# Patient Record
Sex: Male | Born: 1960 | Race: White | Hispanic: No | Smoking: Never smoker
Health system: Southern US, Community
[De-identification: ages and names within clinical notes are randomized; demographics above are authoritative.]

## PROBLEM LIST (undated history)

## (undated) ENCOUNTER — Emergency Department (HOSPITAL_COMMUNITY): Payer: 59

## (undated) DIAGNOSIS — F329 Major depressive disorder, single episode, unspecified: Secondary | ICD-10-CM

## (undated) DIAGNOSIS — R3915 Urgency of urination: Secondary | ICD-10-CM

## (undated) DIAGNOSIS — G8929 Other chronic pain: Secondary | ICD-10-CM

## (undated) DIAGNOSIS — K5900 Constipation, unspecified: Secondary | ICD-10-CM

## (undated) DIAGNOSIS — M75102 Unspecified rotator cuff tear or rupture of left shoulder, not specified as traumatic: Secondary | ICD-10-CM

## (undated) DIAGNOSIS — M1990 Unspecified osteoarthritis, unspecified site: Secondary | ICD-10-CM

## (undated) DIAGNOSIS — R51 Headache: Secondary | ICD-10-CM

## (undated) DIAGNOSIS — S83241A Other tear of medial meniscus, current injury, right knee, initial encounter: Secondary | ICD-10-CM

## (undated) DIAGNOSIS — R42 Dizziness and giddiness: Secondary | ICD-10-CM

## (undated) DIAGNOSIS — E785 Hyperlipidemia, unspecified: Secondary | ICD-10-CM

## (undated) DIAGNOSIS — F32A Depression, unspecified: Secondary | ICD-10-CM

## (undated) DIAGNOSIS — J302 Other seasonal allergic rhinitis: Secondary | ICD-10-CM

## (undated) DIAGNOSIS — J189 Pneumonia, unspecified organism: Secondary | ICD-10-CM

## (undated) DIAGNOSIS — E119 Type 2 diabetes mellitus without complications: Secondary | ICD-10-CM

## (undated) DIAGNOSIS — G4700 Insomnia, unspecified: Secondary | ICD-10-CM

## (undated) DIAGNOSIS — M7542 Impingement syndrome of left shoulder: Secondary | ICD-10-CM

## (undated) DIAGNOSIS — M2550 Pain in unspecified joint: Secondary | ICD-10-CM

## (undated) DIAGNOSIS — R350 Frequency of micturition: Secondary | ICD-10-CM

## (undated) DIAGNOSIS — I10 Essential (primary) hypertension: Secondary | ICD-10-CM

## (undated) DIAGNOSIS — K219 Gastro-esophageal reflux disease without esophagitis: Secondary | ICD-10-CM

## (undated) DIAGNOSIS — M549 Dorsalgia, unspecified: Secondary | ICD-10-CM

## (undated) HISTORY — PX: LITHOTRIPSY: SUR834

## (undated) HISTORY — PX: NASAL SEPTUM SURGERY: SHX37

## (undated) HISTORY — DX: Impingement syndrome of left shoulder: M75.42

## (undated) HISTORY — DX: Other tear of medial meniscus, current injury, right knee, initial encounter: S83.241A

## (undated) HISTORY — PX: TONSILLECTOMY: SUR1361

## (undated) HISTORY — PX: COLONOSCOPY: SHX174

## (undated) HISTORY — PX: OTHER SURGICAL HISTORY: SHX169

## (undated) HISTORY — PX: BACK SURGERY: SHX140

## (undated) HISTORY — DX: Unspecified rotator cuff tear or rupture of left shoulder, not specified as traumatic: M75.102

---

## 1979-12-14 HISTORY — PX: OTHER SURGICAL HISTORY: SHX169

## 1984-12-13 DIAGNOSIS — J189 Pneumonia, unspecified organism: Secondary | ICD-10-CM

## 1984-12-13 HISTORY — DX: Pneumonia, unspecified organism: J18.9

## 2001-01-12 ENCOUNTER — Encounter: Payer: Self-pay | Admitting: Otolaryngology

## 2001-01-12 ENCOUNTER — Encounter: Admission: RE | Admit: 2001-01-12 | Discharge: 2001-01-12 | Payer: Self-pay | Admitting: Otolaryngology

## 2001-01-24 ENCOUNTER — Encounter: Payer: Self-pay | Admitting: Otolaryngology

## 2001-01-24 ENCOUNTER — Encounter: Admission: RE | Admit: 2001-01-24 | Discharge: 2001-01-24 | Payer: Self-pay | Admitting: Otolaryngology

## 2001-02-07 ENCOUNTER — Ambulatory Visit (HOSPITAL_BASED_OUTPATIENT_CLINIC_OR_DEPARTMENT_OTHER): Admission: RE | Admit: 2001-02-07 | Discharge: 2001-02-07 | Payer: Self-pay | Admitting: Otolaryngology

## 2009-02-05 ENCOUNTER — Ambulatory Visit: Payer: Self-pay | Admitting: Urology

## 2010-03-12 ENCOUNTER — Ambulatory Visit: Payer: Self-pay | Admitting: Urology

## 2010-03-16 ENCOUNTER — Ambulatory Visit: Payer: Self-pay | Admitting: Urology

## 2010-12-28 ENCOUNTER — Ambulatory Visit: Payer: Self-pay | Admitting: Gastroenterology

## 2010-12-30 LAB — PATHOLOGY REPORT

## 2011-06-04 ENCOUNTER — Ambulatory Visit: Payer: Self-pay | Admitting: Urology

## 2011-06-23 ENCOUNTER — Ambulatory Visit: Payer: Self-pay | Admitting: Urology

## 2011-06-30 ENCOUNTER — Ambulatory Visit: Payer: Self-pay | Admitting: Urology

## 2011-11-30 ENCOUNTER — Ambulatory Visit: Payer: Self-pay | Admitting: Urology

## 2011-12-16 ENCOUNTER — Ambulatory Visit: Payer: Self-pay | Admitting: Urology

## 2012-11-25 ENCOUNTER — Encounter: Payer: Self-pay | Admitting: *Deleted

## 2012-11-30 ENCOUNTER — Other Ambulatory Visit: Payer: Self-pay | Admitting: Neurosurgery

## 2012-12-22 ENCOUNTER — Encounter (HOSPITAL_COMMUNITY): Payer: Self-pay

## 2012-12-22 ENCOUNTER — Encounter (HOSPITAL_COMMUNITY)
Admission: RE | Admit: 2012-12-22 | Discharge: 2012-12-22 | Disposition: A | Discharge status: Home / Self Care | Payer: 59 | Source: Ambulatory Visit | Attending: Neurosurgery | Admitting: Neurosurgery

## 2012-12-22 ENCOUNTER — Encounter (HOSPITAL_COMMUNITY)
Admission: RE | Admit: 2012-12-22 | Discharge: 2012-12-22 | Disposition: A | Discharge status: Home / Self Care | Payer: 59 | Source: Ambulatory Visit | Attending: Anesthesiology | Admitting: Anesthesiology

## 2012-12-22 HISTORY — DX: Frequency of micturition: R35.0

## 2012-12-22 HISTORY — DX: Dizziness and giddiness: R42

## 2012-12-22 HISTORY — DX: Pneumonia, unspecified organism: J18.9

## 2012-12-22 HISTORY — DX: Urgency of urination: R39.15

## 2012-12-22 HISTORY — DX: Other seasonal allergic rhinitis: J30.2

## 2012-12-22 HISTORY — DX: Constipation, unspecified: K59.00

## 2012-12-22 HISTORY — DX: Major depressive disorder, single episode, unspecified: F32.9

## 2012-12-22 HISTORY — DX: Insomnia, unspecified: G47.00

## 2012-12-22 HISTORY — DX: Essential (primary) hypertension: I10

## 2012-12-22 HISTORY — DX: Dorsalgia, unspecified: M54.9

## 2012-12-22 HISTORY — DX: Pain in unspecified joint: M25.50

## 2012-12-22 HISTORY — DX: Hyperlipidemia, unspecified: E78.5

## 2012-12-22 HISTORY — DX: Type 2 diabetes mellitus without complications: E11.9

## 2012-12-22 HISTORY — DX: Other chronic pain: G89.29

## 2012-12-22 HISTORY — DX: Depression, unspecified: F32.A

## 2012-12-22 HISTORY — DX: Headache: R51

## 2012-12-22 HISTORY — DX: Gastro-esophageal reflux disease without esophagitis: K21.9

## 2012-12-22 HISTORY — DX: Unspecified osteoarthritis, unspecified site: M19.90

## 2012-12-22 LAB — CBC WITH DIFFERENTIAL/PLATELET
Basophils Absolute: 0 10*3/uL (ref 0.0–0.1)
Eosinophils Absolute: 0.3 10*3/uL (ref 0.0–0.7)
Eosinophils Relative: 3 % (ref 0–5)
HCT: 42 % (ref 39.0–52.0)
Lymphocytes Relative: 32 % (ref 12–46)
MCH: 31.3 pg (ref 26.0–34.0)
MCHC: 34 g/dL (ref 30.0–36.0)
MCV: 91.9 fL (ref 78.0–100.0)
Monocytes Absolute: 0.6 10*3/uL (ref 0.1–1.0)
Platelets: 246 10*3/uL (ref 150–400)
RDW: 13 % (ref 11.5–15.5)

## 2012-12-22 LAB — BASIC METABOLIC PANEL
BUN: 16 mg/dL (ref 6–23)
Calcium: 10.5 mg/dL (ref 8.4–10.5)
Creatinine, Ser: 0.83 mg/dL (ref 0.50–1.35)
GFR calc Af Amer: >90 mL/min (ref >90)
GFR calc non Af Amer: >90 mL/min (ref >90)
Glucose, Bld: 157 mg/dL — ABNORMAL HIGH (ref 70–99)
Potassium: 4.4 mEq/L (ref 3.5–5.1)

## 2012-12-22 LAB — ABO/RH: ABO/RH(D): A POS

## 2012-12-22 LAB — TYPE AND SCREEN: ABO/RH(D): A POS

## 2012-12-22 NOTE — Progress Notes (Signed)
Pt doesn't have a cardiologist  Denies ever having an echo/stress test/heart cath  Medical MD is Dr.CJ Margo Aye @ [facility]  Denies EKG and CXR within past

## 2012-12-22 NOTE — Pre-Procedure Instructions (Signed)
TYRICK DUNAGAN  12/22/2012   Your procedure is scheduled on:  Tues, Jan 14 @ 10:30 AM  Report to [facility] at 8:30 AM.  Call this number if you have problems the morning of surgery: [phone]   Remember:   Do not eat food or drink liquids after midnight.   Take these medicines the morning of surgery with A SIP OF WATER: Esomeprazole(Nexium),Sertraline(Zoloft),and Tamsulosin(Flomax)   Do not wear jewelry  Do not wear lotions, powders, or colognes. You may wear deodorant.  Men may shave face and neck.  Do not bring valuables to the hospital.  Contacts, dentures or bridgework may not be worn into surgery.  Leave suitcase in the car. After surgery it may be brought to your room.  For patients admitted to the hospital, checkout time is 11:00 AM the day of  discharge.   Patients discharged the day of surgery will not be allowed to drive  home.    Special Instructions: Shower using CHG 2 nights before surgery and the night before surgery.  If you shower the day of surgery use CHG.  Use special wash - you have one bottle of CHG for all showers.  You should use approximately 1/3 of the bottle for each shower.   Please read over the following fact sheets that you were given: Pain Booklet, Coughing and Deep Breathing, Blood Transfusion Information, MRSA Information and Surgical Site Infection Prevention

## 2012-12-25 MED ORDER — CEFAZOLIN SODIUM-DEXTROSE 2-3 GM-% IV SOLR
2.0000 g | INTRAVENOUS | Status: AC
  Administered 2012-12-26: 2 g via INTRAVENOUS
  Filled 2012-12-25: qty 50

## 2012-12-26 ENCOUNTER — Encounter (HOSPITAL_COMMUNITY): Payer: Self-pay | Admitting: Anesthesiology

## 2012-12-26 ENCOUNTER — Ambulatory Visit (HOSPITAL_COMMUNITY): Payer: 59 | Admitting: Anesthesiology

## 2012-12-26 ENCOUNTER — Inpatient Hospital Stay (HOSPITAL_COMMUNITY)
Admission: RE | Admit: 2012-12-26 | Discharge: 2012-12-27 | DRG: 552 | Disposition: A | Discharge status: Home / Self Care | Payer: 59 | Source: Ambulatory Visit | Attending: Neurosurgery | Admitting: Neurosurgery

## 2012-12-26 ENCOUNTER — Inpatient Hospital Stay (HOSPITAL_COMMUNITY): Payer: 59

## 2012-12-26 ENCOUNTER — Encounter (HOSPITAL_COMMUNITY): Payer: Self-pay | Admitting: *Deleted

## 2012-12-26 ENCOUNTER — Encounter (HOSPITAL_COMMUNITY)
Admission: RE | Disposition: A | Discharge status: Home / Self Care | Payer: Self-pay | Source: Ambulatory Visit | Attending: Neurosurgery

## 2012-12-26 DIAGNOSIS — F329 Major depressive disorder, single episode, unspecified: Secondary | ICD-10-CM | POA: Diagnosis present

## 2012-12-26 DIAGNOSIS — R350 Frequency of micturition: Secondary | ICD-10-CM | POA: Diagnosis present

## 2012-12-26 DIAGNOSIS — G4700 Insomnia, unspecified: Secondary | ICD-10-CM | POA: Diagnosis present

## 2012-12-26 DIAGNOSIS — Z01818 Encounter for other preprocedural examination: Secondary | ICD-10-CM

## 2012-12-26 DIAGNOSIS — M48062 Spinal stenosis, lumbar region with neurogenic claudication: Principal | ICD-10-CM | POA: Diagnosis present

## 2012-12-26 DIAGNOSIS — Z01812 Encounter for preprocedural laboratory examination: Secondary | ICD-10-CM

## 2012-12-26 DIAGNOSIS — K219 Gastro-esophageal reflux disease without esophagitis: Secondary | ICD-10-CM | POA: Diagnosis present

## 2012-12-26 DIAGNOSIS — E785 Hyperlipidemia, unspecified: Secondary | ICD-10-CM | POA: Diagnosis present

## 2012-12-26 DIAGNOSIS — J309 Allergic rhinitis, unspecified: Secondary | ICD-10-CM | POA: Diagnosis present

## 2012-12-26 DIAGNOSIS — F3289 Other specified depressive episodes: Secondary | ICD-10-CM | POA: Diagnosis present

## 2012-12-26 DIAGNOSIS — I10 Essential (primary) hypertension: Secondary | ICD-10-CM | POA: Diagnosis present

## 2012-12-26 DIAGNOSIS — Z79899 Other long term (current) drug therapy: Secondary | ICD-10-CM

## 2012-12-26 DIAGNOSIS — M129 Arthropathy, unspecified: Secondary | ICD-10-CM | POA: Diagnosis present

## 2012-12-26 DIAGNOSIS — E119 Type 2 diabetes mellitus without complications: Secondary | ICD-10-CM | POA: Diagnosis present

## 2012-12-26 DIAGNOSIS — G8929 Other chronic pain: Secondary | ICD-10-CM | POA: Diagnosis present

## 2012-12-26 LAB — GLUCOSE, CAPILLARY
Glucose-Capillary: 161 mg/dL — ABNORMAL HIGH (ref 70–99)
Glucose-Capillary: 207 mg/dL — ABNORMAL HIGH (ref 70–99)

## 2012-12-26 SURGERY — POSTERIOR LUMBAR FUSION 1 LEVEL
Anesthesia: General | Site: Spine Lumbar | Wound class: Clean

## 2012-12-26 MED ORDER — SODIUM CHLORIDE 0.9 % IV SOLN
INTRAVENOUS | Status: AC
  Filled 2012-12-26: qty 500

## 2012-12-26 MED ORDER — SODIUM CHLORIDE 0.9 % IV SOLN: 250.0000 mL | INTRAVENOUS | Status: DC

## 2012-12-26 MED ORDER — MENTHOL 3 MG MT LOZG: 1.0000 | LOZENGE | OROMUCOSAL | Status: DC | PRN

## 2012-12-26 MED ORDER — ACETAMINOPHEN 10 MG/ML IV SOLN
INTRAVENOUS | Status: AC
  Filled 2012-12-26: qty 100

## 2012-12-26 MED ORDER — SODIUM CHLORIDE 0.9 % IJ SOLN
3.0000 mL | Freq: Two times a day (BID) | INTRAMUSCULAR | Status: DC
  Administered 2012-12-26: 3 mL via INTRAVENOUS

## 2012-12-26 MED ORDER — ACETAMINOPHEN 650 MG RE SUPP: 650.0000 mg | RECTAL | Status: DC | PRN

## 2012-12-26 MED ORDER — ROCURONIUM BROMIDE 100 MG/10ML IV SOLN
INTRAVENOUS | Status: DC | PRN
  Administered 2012-12-26: 5 mg via INTRAVENOUS
  Administered 2012-12-26 (×2): 50 mg via INTRAVENOUS

## 2012-12-26 MED ORDER — PROPOFOL 10 MG/ML IV BOLUS
INTRAVENOUS | Status: DC | PRN
  Administered 2012-12-26: 150 mg via INTRAVENOUS

## 2012-12-26 MED ORDER — ADULT MULTIVITAMIN W/MINERALS CH
1.0000 | ORAL_TABLET | Freq: Every day | ORAL | Status: DC
  Administered 2012-12-26: 1 via ORAL
  Filled 2012-12-26 (×2): qty 1

## 2012-12-26 MED ORDER — CEFAZOLIN SODIUM 1-5 GM-% IV SOLN
1.0000 g | Freq: Three times a day (TID) | INTRAVENOUS | Status: AC
  Administered 2012-12-26 – 2012-12-27 (×2): 1 g via INTRAVENOUS
  Filled 2012-12-26 (×2): qty 50

## 2012-12-26 MED ORDER — PHENYLEPHRINE HCL 10 MG/ML IJ SOLN
10.0000 mg | INTRAVENOUS | Status: DC | PRN
  Administered 2012-12-26: 20 ug/min via INTRAVENOUS

## 2012-12-26 MED ORDER — 0.9 % SODIUM CHLORIDE (POUR BTL) OPTIME
TOPICAL | Status: DC | PRN
  Administered 2012-12-26: 1000 mL

## 2012-12-26 MED ORDER — PHENOL 1.4 % MT LIQD: 1.0000 | OROMUCOSAL | Status: DC | PRN

## 2012-12-26 MED ORDER — MIDAZOLAM HCL 5 MG/5ML IJ SOLN
INTRAMUSCULAR | Status: DC | PRN
  Administered 2012-12-26: 2 mg via INTRAVENOUS

## 2012-12-26 MED ORDER — SENNA 8.6 MG PO TABS
1.0000 | ORAL_TABLET | Freq: Two times a day (BID) | ORAL | Status: DC
  Administered 2012-12-26: 8.6 mg via ORAL
  Filled 2012-12-26 (×3): qty 1

## 2012-12-26 MED ORDER — ZOLPIDEM TARTRATE 5 MG PO TABS: 5.0000 mg | ORAL_TABLET | Freq: Every evening | ORAL | Status: DC | PRN

## 2012-12-26 MED ORDER — ARTIFICIAL TEARS OP OINT
TOPICAL_OINTMENT | OPHTHALMIC | Status: DC | PRN
  Administered 2012-12-26: 1 via OPHTHALMIC

## 2012-12-26 MED ORDER — OLOPATADINE HCL 0.1 % OP SOLN
1.0000 [drp] | Freq: Two times a day (BID) | OPHTHALMIC | Status: DC
  Administered 2012-12-26: 1 [drp] via OPHTHALMIC
  Filled 2012-12-26: qty 5

## 2012-12-26 MED ORDER — PANTOPRAZOLE SODIUM 40 MG PO TBEC: 40.0000 mg | DELAYED_RELEASE_TABLET | Freq: Every day | ORAL | Status: DC

## 2012-12-26 MED ORDER — LORATADINE 10 MG PO TABS
10.0000 mg | ORAL_TABLET | Freq: Every day | ORAL | Status: DC
  Administered 2012-12-26: 10 mg via ORAL
  Filled 2012-12-26 (×2): qty 1

## 2012-12-26 MED ORDER — THROMBIN 20000 UNITS EX SOLR
CUTANEOUS | Status: DC | PRN
  Administered 2012-12-26: 12:00:00 via TOPICAL

## 2012-12-26 MED ORDER — POLYETHYLENE GLYCOL 3350 17 G PO PACK
17.0000 g | PACK | Freq: Every day | ORAL | Status: DC | PRN
  Filled 2012-12-26: qty 1

## 2012-12-26 MED ORDER — OMEGA-3 FATTY ACIDS 1000 MG PO CAPS
1.0000 g | ORAL_CAPSULE | Freq: Two times a day (BID) | ORAL | Status: DC
  Administered 2012-12-26: 1 g via ORAL
  Filled 2012-12-26 (×3): qty 1

## 2012-12-26 MED ORDER — SODIUM CHLORIDE 0.9 % IJ SOLN: 3.0000 mL | INTRAMUSCULAR | Status: DC | PRN

## 2012-12-26 MED ORDER — BUPIVACAINE HCL (PF) 0.25 % IJ SOLN
INTRAMUSCULAR | Status: DC | PRN
  Administered 2012-12-26: 30 mL

## 2012-12-26 MED ORDER — HYDROMORPHONE HCL PF 1 MG/ML IJ SOLN
INTRAMUSCULAR | Status: AC
  Filled 2012-12-26: qty 1

## 2012-12-26 MED ORDER — DEXAMETHASONE SODIUM PHOSPHATE 10 MG/ML IJ SOLN
10.0000 mg | INTRAMUSCULAR | Status: AC
  Administered 2012-12-26: 10 mg via INTRAVENOUS
  Filled 2012-12-26: qty 1

## 2012-12-26 MED ORDER — OXYCODONE-ACETAMINOPHEN 5-325 MG PO TABS
1.0000 | ORAL_TABLET | ORAL | Status: DC | PRN
  Administered 2012-12-26 – 2012-12-27 (×4): 2 via ORAL
  Filled 2012-12-26 (×4): qty 2

## 2012-12-26 MED ORDER — TAMSULOSIN HCL 0.4 MG PO CAPS
0.4000 mg | ORAL_CAPSULE | Freq: Every day | ORAL | Status: DC
  Filled 2012-12-26: qty 1

## 2012-12-26 MED ORDER — BISACODYL 10 MG RE SUPP: 10.0000 mg | Freq: Every day | RECTAL | Status: DC | PRN

## 2012-12-26 MED ORDER — FENTANYL CITRATE 0.05 MG/ML IJ SOLN
INTRAMUSCULAR | Status: DC | PRN
  Administered 2012-12-26 (×2): 50 ug via INTRAVENOUS
  Administered 2012-12-26: 150 ug via INTRAVENOUS

## 2012-12-26 MED ORDER — PHENYLEPHRINE HCL 10 MG/ML IJ SOLN
INTRAMUSCULAR | Status: DC | PRN
  Administered 2012-12-26: 80 ug via INTRAVENOUS

## 2012-12-26 MED ORDER — HYDROCHLOROTHIAZIDE 25 MG PO TABS
25.0000 mg | ORAL_TABLET | Freq: Every day | ORAL | Status: DC
  Administered 2012-12-26: 25 mg via ORAL
  Filled 2012-12-26 (×2): qty 1

## 2012-12-26 MED ORDER — IRBESARTAN 300 MG PO TABS
300.0000 mg | ORAL_TABLET | Freq: Every day | ORAL | Status: DC
  Administered 2012-12-26: 300 mg via ORAL
  Filled 2012-12-26 (×2): qty 1

## 2012-12-26 MED ORDER — LACTATED RINGERS IV SOLN
INTRAVENOUS | Status: DC | PRN
  Administered 2012-12-26 (×2): via INTRAVENOUS

## 2012-12-26 MED ORDER — BACITRACIN 50000 UNITS IM SOLR
INTRAMUSCULAR | Status: AC
  Filled 2012-12-26: qty 1

## 2012-12-26 MED ORDER — SODIUM CHLORIDE 0.9 % IR SOLN
Status: DC | PRN
  Administered 2012-12-26: 12:00:00

## 2012-12-26 MED ORDER — EPHEDRINE SULFATE 50 MG/ML IJ SOLN
INTRAMUSCULAR | Status: DC | PRN
  Administered 2012-12-26 (×3): 10 mg via INTRAVENOUS

## 2012-12-26 MED ORDER — ONDANSETRON HCL 4 MG/2ML IJ SOLN
INTRAMUSCULAR | Status: DC | PRN
  Administered 2012-12-26: 4 mg via INTRAVENOUS

## 2012-12-26 MED ORDER — MUPIROCIN 2 % EX OINT
1.0000 "application " | TOPICAL_OINTMENT | Freq: Two times a day (BID) | CUTANEOUS | Status: DC
  Administered 2012-12-26: 1 via NASAL

## 2012-12-26 MED ORDER — ACETAMINOPHEN 325 MG PO TABS: 650.0000 mg | ORAL_TABLET | ORAL | Status: DC | PRN

## 2012-12-26 MED ORDER — HYDROCODONE-ACETAMINOPHEN 5-325 MG PO TABS: 1.0000 | ORAL_TABLET | ORAL | Status: DC | PRN

## 2012-12-26 MED ORDER — DIAZEPAM 5 MG PO TABS
5.0000 mg | ORAL_TABLET | Freq: Four times a day (QID) | ORAL | Status: DC | PRN
  Administered 2012-12-26: 5 mg via ORAL
  Administered 2012-12-27: 10 mg via ORAL
  Filled 2012-12-26: qty 1
  Filled 2012-12-26: qty 2

## 2012-12-26 MED ORDER — ONDANSETRON HCL 4 MG/2ML IJ SOLN: 4.0000 mg | INTRAMUSCULAR | Status: DC | PRN

## 2012-12-26 MED ORDER — ACETAMINOPHEN 10 MG/ML IV SOLN
1000.0000 mg | Freq: Once | INTRAVENOUS | Status: AC | PRN
  Administered 2012-12-26: 1000 mg via INTRAVENOUS

## 2012-12-26 MED ORDER — ONDANSETRON HCL 4 MG/2ML IJ SOLN: 4.0000 mg | Freq: Once | INTRAMUSCULAR | Status: DC | PRN

## 2012-12-26 MED ORDER — FLUTICASONE PROPIONATE 50 MCG/ACT NA SUSP
1.0000 | Freq: Every day | NASAL | Status: DC
  Administered 2012-12-26: 1 via NASAL
  Filled 2012-12-26: qty 16

## 2012-12-26 MED ORDER — EZETIMIBE 10 MG PO TABS
10.0000 mg | ORAL_TABLET | Freq: Every day | ORAL | Status: DC
  Administered 2012-12-26: 10 mg via ORAL
  Filled 2012-12-26 (×2): qty 1

## 2012-12-26 MED ORDER — SERTRALINE HCL 50 MG PO TABS
50.0000 mg | ORAL_TABLET | Freq: Every day | ORAL | Status: DC
  Filled 2012-12-26: qty 1

## 2012-12-26 MED ORDER — HYDROMORPHONE HCL PF 1 MG/ML IJ SOLN
0.5000 mg | INTRAMUSCULAR | Status: DC | PRN
  Administered 2012-12-26 – 2012-12-27 (×3): 1 mg via INTRAVENOUS
  Filled 2012-12-26 (×3): qty 1

## 2012-12-26 MED ORDER — OLMESARTAN MEDOXOMIL-HCTZ 40-25 MG PO TABS: 1.0000 | ORAL_TABLET | Freq: Every day | ORAL | Status: DC

## 2012-12-26 MED ORDER — FLEET ENEMA 7-19 GM/118ML RE ENEM
1.0000 | ENEMA | Freq: Once | RECTAL | Status: AC | PRN
  Filled 2012-12-26: qty 1

## 2012-12-26 MED ORDER — LIDOCAINE HCL (CARDIAC) 20 MG/ML IV SOLN
INTRAVENOUS | Status: DC | PRN
  Administered 2012-12-26: 50 mg via INTRAVENOUS

## 2012-12-26 MED ORDER — NEOSTIGMINE METHYLSULFATE 1 MG/ML IJ SOLN
INTRAMUSCULAR | Status: DC | PRN
  Administered 2012-12-26: 5 mg via INTRAVENOUS

## 2012-12-26 MED ORDER — GLYCOPYRROLATE 0.2 MG/ML IJ SOLN
INTRAMUSCULAR | Status: DC | PRN
  Administered 2012-12-26: .8 mg via INTRAVENOUS

## 2012-12-26 MED ORDER — HYDROMORPHONE HCL PF 1 MG/ML IJ SOLN
0.2500 mg | INTRAMUSCULAR | Status: DC | PRN
  Administered 2012-12-26 (×4): 0.5 mg via INTRAVENOUS

## 2012-12-26 MED ORDER — METFORMIN HCL 500 MG PO TABS
1000.0000 mg | ORAL_TABLET | Freq: Every day | ORAL | Status: DC
  Administered 2012-12-26: 1000 mg via ORAL
  Filled 2012-12-26 (×2): qty 2

## 2012-12-26 MED ORDER — ALUM & MAG HYDROXIDE-SIMETH 200-200-20 MG/5ML PO SUSP: 30.0000 mL | Freq: Four times a day (QID) | ORAL | Status: DC | PRN

## 2012-12-26 SURGICAL SUPPLY — 70 items
ADH SKN CLS APL DERMABOND .7 (GAUZE/BANDAGES/DRESSINGS)
ADH SKN CLS LQ APL DERMABOND (GAUZE/BANDAGES/DRESSINGS) ×1
APL SKNCLS STERI-STRIP NONHPOA (GAUZE/BANDAGES/DRESSINGS) ×1
BAG DECANTER FOR FLEXI CONT (MISCELLANEOUS) ×2 IMPLANT
BENZOIN TINCTURE PRP APPL 2/3 (GAUZE/BANDAGES/DRESSINGS) ×2 IMPLANT
BLADE SURG ROTATE 9660 (MISCELLANEOUS) IMPLANT
BRUSH SCRUB EZ PLAIN DRY (MISCELLANEOUS) ×2 IMPLANT
BUR MATCHSTICK NEURO 3.0 LAGG (BURR) ×2 IMPLANT
CANISTER SUCTION 2500CC (MISCELLANEOUS) ×2 IMPLANT
CAP LCK SPNE (Orthopedic Implant) ×4 IMPLANT
CAP LOCK SPINE RADIUS (Orthopedic Implant) IMPLANT
CAP LOCKING (Orthopedic Implant) ×8 IMPLANT
CLOTH BEACON ORANGE TIMEOUT ST (SAFETY) ×2 IMPLANT
CONT SPEC 4OZ CLIKSEAL STRL BL (MISCELLANEOUS) ×4 IMPLANT
COVER BACK TABLE 24X17X13 BIG (DRAPES) IMPLANT
COVER TABLE BACK 60X90 (DRAPES) ×2 IMPLANT
Capstone Spinal System 14mmx22mm ×2 IMPLANT
DECANTER SPIKE VIAL GLASS SM (MISCELLANEOUS) ×1 IMPLANT
DERMABOND ADHESIVE PROPEN (GAUZE/BANDAGES/DRESSINGS) ×1
DERMABOND ADVANCED (GAUZE/BANDAGES/DRESSINGS)
DERMABOND ADVANCED .7 DNX12 (GAUZE/BANDAGES/DRESSINGS) ×1 IMPLANT
DERMABOND ADVANCED .7 DNX6 (GAUZE/BANDAGES/DRESSINGS) IMPLANT
DRAPE C-ARM 42X72 X-RAY (DRAPES) ×4 IMPLANT
DRAPE LAPAROTOMY 100X72X124 (DRAPES) ×2 IMPLANT
DRAPE POUCH INSTRU U-SHP 10X18 (DRAPES) ×2 IMPLANT
DRAPE PROXIMA HALF (DRAPES) IMPLANT
DRAPE SURG 17X23 STRL (DRAPES) ×8 IMPLANT
ELECT REM PT RETURN 9FT ADLT (ELECTROSURGICAL) ×2
ELECTRODE REM PT RTRN 9FT ADLT (ELECTROSURGICAL) ×1 IMPLANT
EVACUATOR 1/8 PVC DRAIN (DRAIN) ×2 IMPLANT
GAUZE SPONGE 4X4 16PLY XRAY LF (GAUZE/BANDAGES/DRESSINGS) IMPLANT
GLOVE BIOGEL PI IND STRL 6.5 (GLOVE) IMPLANT
GLOVE BIOGEL PI IND STRL 7.0 (GLOVE) IMPLANT
GLOVE BIOGEL PI IND STRL 7.5 (GLOVE) IMPLANT
GLOVE BIOGEL PI INDICATOR 6.5 (GLOVE)
GLOVE BIOGEL PI INDICATOR 7.0 (GLOVE) ×4
GLOVE BIOGEL PI INDICATOR 7.5 (GLOVE) ×2
GLOVE ECLIPSE 7.5 STRL STRAW (GLOVE) ×3 IMPLANT
GLOVE ECLIPSE 8.5 STRL (GLOVE) ×4 IMPLANT
GLOVE EXAM NITRILE LRG STRL (GLOVE) IMPLANT
GLOVE EXAM NITRILE MD LF STRL (GLOVE) IMPLANT
GLOVE EXAM NITRILE XL STR (GLOVE) IMPLANT
GLOVE EXAM NITRILE XS STR PU (GLOVE) IMPLANT
GLOVE OPTIFIT SS 6.5 STRL BRWN (GLOVE) ×3 IMPLANT
GOWN BRE IMP SLV AUR LG STRL (GOWN DISPOSABLE) ×2 IMPLANT
GOWN BRE IMP SLV AUR XL STRL (GOWN DISPOSABLE) ×6 IMPLANT
GOWN STRL REIN 2XL LVL4 (GOWN DISPOSABLE) IMPLANT
KIT BASIN OR (CUSTOM PROCEDURE TRAY) ×2 IMPLANT
KIT ROOM TURNOVER OR (KITS) ×2 IMPLANT
MILL MEDIUM DISP (BLADE) ×1 IMPLANT
NEEDLE HYPO 22GX1.5 SAFETY (NEEDLE) ×2 IMPLANT
NS IRRIG 1000ML POUR BTL (IV SOLUTION) ×2 IMPLANT
PACK LAMINECTOMY NEURO (CUSTOM PROCEDURE TRAY) ×2 IMPLANT
ROD RADIUS 40MM (Neuro Prosthesis/Implant) ×4 IMPLANT
ROD SPNL 40X5.5XNS TI RDS (Neuro Prosthesis/Implant) IMPLANT
SCREW 6.75X40MM (Screw) ×2 IMPLANT
SCREW 6.75X45MM (Screw) ×2 IMPLANT
SPONGE GAUZE 4X4 12PLY (GAUZE/BANDAGES/DRESSINGS) ×2 IMPLANT
SPONGE SURGIFOAM ABS GEL 100 (HEMOSTASIS) ×2 IMPLANT
STRIP CLOSURE SKIN 1/2X4 (GAUZE/BANDAGES/DRESSINGS) ×4 IMPLANT
SUT VIC AB 0 CT1 18XCR BRD8 (SUTURE) ×2 IMPLANT
SUT VIC AB 0 CT1 8-18 (SUTURE) ×2
SUT VIC AB 2-0 CT1 18 (SUTURE) ×2 IMPLANT
SUT VIC AB 3-0 SH 8-18 (SUTURE) ×3 IMPLANT
SYR 20ML ECCENTRIC (SYRINGE) ×2 IMPLANT
TAPE CLOTH SURG 4X10 WHT LF (GAUZE/BANDAGES/DRESSINGS) ×1 IMPLANT
TOWEL OR 17X24 6PK STRL BLUE (TOWEL DISPOSABLE) ×2 IMPLANT
TOWEL OR 17X26 10 PK STRL BLUE (TOWEL DISPOSABLE) ×2 IMPLANT
TRAY FOLEY CATH 14FRSI W/METER (CATHETERS) ×2 IMPLANT
WATER STERILE IRR 1000ML POUR (IV SOLUTION) ×2 IMPLANT

## 2012-12-26 NOTE — Anesthesia Preprocedure Evaluation (Addendum)
Anesthesia Evaluation  Patient identified by MRN, date of birth, ID band Patient awake    Reviewed: Allergy & Precautions, H&P , NPO status , Patient's Chart, lab work & pertinent test results  History of Anesthesia Complications Negative for: history of anesthetic complications  Airway Mallampati: II TM Distance: >3 FB Neck ROM: Full    Dental  (+) Teeth Intact and Dental Advisory Given   Pulmonary pneumonia -, resolved,  breath sounds clear to auscultation        Cardiovascular hypertension, Pt. on medications Rhythm:Regular Rate:Normal     Neuro/Psych  Headaches, PSYCHIATRIC DISORDERS Depression    GI/Hepatic Neg liver ROS, GERD-  Medicated and Controlled,  Endo/Other  diabetes, Type 2, Oral Hypoglycemic Agents  Renal/GU negative Renal ROS     Musculoskeletal negative musculoskeletal ROS (+)   Abdominal (+) + obese,   Peds  Hematology negative hematology ROS (+)   Anesthesia Other Findings   Reproductive/Obstetrics negative OB ROS                         Anesthesia Physical Anesthesia Plan  ASA: III  Anesthesia Plan: General   Post-op Pain Management:    Induction: Intravenous  Airway Management Planned: Oral ETT  Additional Equipment:   Intra-op Plan:   Post-operative Plan: Extubation in OR  Informed Consent: I have reviewed the patients History and Physical, chart, labs and discussed the procedure including the risks, benefits and alternatives for the proposed anesthesia with the patient or authorized representative who has indicated his/her understanding and acceptance.   Dental advisory given  Plan Discussed with: CRNA and Surgeon  Anesthesia Plan Comments: (HNP L4-5 Htn Type 2 DM glucose 161 GERD  Plan GA with oral ETT  )        Anesthesia Quick Evaluation

## 2012-12-26 NOTE — H&P (Signed)
[name] is an 52 y.o. male.   Chief Complaint: Back and bilateral leg pain left greater than right HPI: 52 year old male with chronic intractable back and bilateral lower extremity symptoms consistent with neurogenic claudication. Workup has demonstrated evidence of spondylosis with stenosis at L4-5. Patient has failed conservative management including therapy and injections. He presents now for decompression and fusion surgery in hopes of improving his symptoms.  Past Medical History  Diagnosis Date  . Hypertension     takes Benicar daily  . Hyperlipidemia     takes Zetia daily  . Pneumonia 1986    walking  . Headache     occasionally  . Dizziness   . Arthritis   . Joint pain   . Chronic back pain     stenosis  . Constipation     with pain meds  . GERD (gastroesophageal reflux disease)     takes Nexium daily  . Urinary frequency     takes Flomax daily  . Urinary urgency   . Diabetes mellitus without complication     takes Metformni daily  . Depression     takes Zoloft daily  . Insomnia     takes Ambien nightly  . Seasonal allergies     takes Claritin daily    Past Surgical History  Procedure Date  . Nasal septum surgery   . Lithotripsy   . Removal of kidney stones   . Left elbow surgery   . Colonoscopy     History reviewed. No pertinent family history. Social History:  reports that he has never smoked. He does not have any smokeless tobacco history on file. He reports that he drinks alcohol. He reports that he does not use illicit drugs.  Allergies:  Allergies  Allergen Reactions  . Codeine   . Lipitor (Atorvastatin)     Myalgia    Medications Prior to Admission  Medication Sig Dispense Refill  . esomeprazole (NEXIUM) 40 MG capsule Take 40 mg by mouth daily before breakfast.      . ezetimibe (ZETIA) 10 MG tablet Take 10 mg by mouth daily.      . fish oil-omega-3 fatty acids 1000 MG capsule Take 1 g by mouth 2 (two) times daily.      . fluticasone  (FLONASE) 50 MCG/ACT nasal spray Place 1 spray into the nose daily.      Marland Kitchen loratadine (CLARITIN) 10 MG tablet Take 10 mg by mouth daily.      . metFORMIN (GLUCOPHAGE) 1000 MG tablet Take 1,000 mg by mouth at bedtime.       . Multiple Vitamin (MULTIVITAMIN WITH MINERALS) TABS Take 1 tablet by mouth daily.      Marland Kitchen olmesartan-hydrochlorothiazide (BENICAR HCT) 40-25 MG per tablet Take 1 tablet by mouth daily.      Marland Kitchen olopatadine (PATANOL) 0.1 % ophthalmic solution Place 1 drop into both eyes 2 (two) times daily.      . sertraline (ZOLOFT) 50 MG tablet Take 50 mg by mouth daily.      . Tamsulosin HCl (FLOMAX) 0.4 MG CAPS Take 0.4 mg by mouth daily.      Marland Kitchen zolpidem (AMBIEN) 10 MG tablet Take 10 mg by mouth at bedtime as needed. For sleep        Results for orders placed during the hospital encounter of 12/26/12 (from the past 48 hour(s))  GLUCOSE, CAPILLARY     Status: Abnormal   Collection Time   12/26/12  8:22 AM  Component Value Range Comment   Glucose-Capillary 161 (*) 70 - 99 mg/dL    No results found.  Review of Systems  Constitutional: Negative.   HENT: Negative.   Eyes: Negative.   Respiratory: Negative.   Cardiovascular: Negative.   Gastrointestinal: Negative.   Genitourinary: Negative.   Musculoskeletal: Negative.   Skin: Negative.   Neurological: Negative.   Endo/Heme/Allergies: Negative.   Psychiatric/Behavioral: Negative.     Blood pressure 130/84, pulse 68, temperature 98.3 F (36.8 C), temperature source Oral, resp. rate 20, SpO2 95.00%. Physical Exam  Constitutional: He is oriented to person, place, and time. He appears well-developed and well-nourished.  HENT:  Head: Normocephalic and atraumatic.  Right Ear: External ear normal.  Left Ear: External ear normal.  Nose: Nose normal.  Mouth/Throat: Oropharynx is clear and moist.  Eyes: Conjunctivae normal and EOM are normal. Pupils are equal, round, and reactive to light.  Neck: Normal range of motion. Neck  supple. No tracheal deviation present. No thyromegaly present.  Cardiovascular: Normal rate, regular rhythm, normal heart sounds and intact distal pulses.   Respiratory: Effort normal and breath sounds normal. No stridor. No respiratory distress. He has no wheezes.  GI: Soft. Bowel sounds are normal. He exhibits no distension. There is no tenderness.  Musculoskeletal: Normal range of motion. He exhibits no edema and no tenderness.  Neurological: He is alert and oriented to person, place, and time. He has normal reflexes. No cranial nerve deficit. Coordination normal.  Skin: Skin is warm and dry. No rash noted. No erythema. No pallor.  Psychiatric: He has a normal mood and affect. His behavior is normal. Judgment and thought content normal.     Assessment/Plan L4-5 stenosis with neurogenic claudication. Plan L4-5 decompressive laminectomy with foraminotomies followed by posterior lumbar interbody fusion and coupled with posterior lateral arthrodesis utilizing nonsegmental pedicle screw instrumentation. Risks and benefits have been explained. Patient wishes to proceed.  Kalyiah Saintil A 12/26/2012, 10:40 AM

## 2012-12-26 NOTE — Transfer of Care (Signed)
Immediate Anesthesia Transfer of Care Note  Patient: [name]  Procedure(s) Performed: Procedure(s) (LRB) with comments: POSTERIOR LUMBAR FUSION 1 LEVEL (N/A) - Lumbar Four-Five Lumbar Interbody Fusion with Telemon Cages, Pedicle Screws  Patient Location: PACU  Anesthesia Type:General  Level of Consciousness: awake, alert  and oriented  Airway & Oxygen Therapy: Patient Spontanous Breathing and Patient connected to nasal cannula oxygen  Post-op Assessment: Report given to PACU RN and Post -op Vital signs reviewed and stable  Post vital signs: Reviewed and stable  Complications: No apparent anesthesia complications

## 2012-12-26 NOTE — Op Note (Signed)
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  Postoperative diagnosis: Same  Procedure Name:  Bilateral complete L4-5 decompressive laminectomy with bilateral L4 and L5 decompressive foraminotomies, more than would be required for simple interbody fusion alone.  L4-5 posterior lumbar interbody fusion utilizing bilateral Telamon peek cage and local autograft.  L4-5 posterior lateral arthrodesis utilizing nonsegmental pedicle screw instrumentation and local autograft.  Surgeon:Alfonso Carden A.Deserie Dirks, M.D.  Asst. Surgeon: Gerlene Fee  Anesthesia: General  Indication: 52 year old male with chronic intractable back and bilateral lower extremity symptoms failing conservative management and consistent with neurogenic claudication has imaging studies demonstrating evidence of severe facet arthropathy spondylosis and stenosis affecting the L4 and L5 nerve roots bilaterally. Patient presents now for decompression and fusion surgery in hopes of improving his symptoms.  Operative note: After induction of anesthesia, patient positioned prone onto Wilson frame and appropriately padded. Lumbar region prepped and draped sterilely. Incision made overlying the L4-5 level and dissection performed bilaterally exposing the lamina and facet joints and transverse processes of L4 and L5. Retractor placed and fluoroscopy utilized to confirm the level. Decompressive laminectomies then performed using Leksell rongeurs Kerrison rongeurs and a high-speed drill to remove the entire lamina of L4 inferior facet of L4 bilaterally superior facet of L5 bilaterally and the superior rim of the L5 lamina bilaterally. All bone is cleaned and used in later autografting. Decompressive foraminotomies were then performed along the course the exiting  L4 and L5 nerve roots bilaterally including the L4 roots within the distal lateral recess of L3-4. Bilateral discectomies were then performed at L4-5. The space was found to be quite large and most appropriate for 14 mm implants. Aggressive curettage of the disc space was undertaken with various curettes and tangent instruments. Soft tissue was removed from the interspace. A 14 mm x 22 mm Telamon peek cage packed with morselized autograft was then packed into place on the patient's right side. Thecal sac and nerve roots were protected during this time. Good position of the cage was confirmed by fluoroscopy. Distractor was removed and patient's contralateral side. Thecal sac and nerve respect on the contralateral side. Once again the space was cleaned and prepared for interbody fusion. Morselized autograft was packed in the interspace. A second 14 mm Telamon cage was then packed into place and recessed approximately 2-3 mm in the posterior cortical margin of L4. Pedicles of L4 and L5 were notified using surface landmarks and intraoperative fluoroscopy. Each pedicle was probed using a pedicle awl. Each pedicle awl track was confirmed to be within bone by using a ball-tipped probe. Each awl track was tapped with a 5.25 mm screw tap. 6.75 x 45 mm screws were placed bilaterally at L4 6.75 x 40 mm screws placed bilaterally at L5. Transverse processes of L4 and L5 were then decorticated using high-speed drill. Morselized autograft was packed posterior laterally for later fusion. Short segment titanium rod and placed over the screw heads at L4 and L5. Locking caps and placed over the screw heads with the construct under compression these locking caps were given a final tightening. Final images revealed good position of the bone graft and hardware at the proper operative level with normal lamina spine. Wound is irrigated one final time. Gelfoam was placed topically for hemostasis. A medium Hemovac drain was left in the  epidural space. Wounds and close in layers with  Vicryl sutures. Steri-Strips and sterile dressing were applied. There were no apparent complications. Patient tolerated the procedure well and returned to the recovery room postop.

## 2012-12-26 NOTE — Preoperative (Signed)
Beta Blockers   Reason not to administer Beta Blockers:Not Applicable 

## 2012-12-26 NOTE — Brief Op Note (Signed)
12/26/2012  1:54 PM  PATIENT:  [name]  52 y.o. male  PRE-OPERATIVE DIAGNOSIS:  stenosis  POST-OPERATIVE DIAGNOSIS:  stenosis  PROCEDURE:  Procedure(s) (LRB) with comments: POSTERIOR LUMBAR FUSION 1 LEVEL (N/A) - Lumbar Four-Five Lumbar Interbody Fusion with Telemon Cages, Pedicle Screws  SURGEON:  Surgeon(s) and Role:    * Temple Pacini, MD - Primary  PHYSICIAN ASSISTANT:   ASSISTANTS:    ANESTHESIA:   general  EBL:  Total I/O In: 1000 [I.V.:1000] Out: 600 [Urine:150; Blood:450]  BLOOD ADMINISTERED:none  DRAINS: (Medium) Hemovact drain(s) in the Epidural space with  Suction Open   LOCAL MEDICATIONS USED:  MARCAINE     SPECIMEN:  No Specimen  DISPOSITION OF SPECIMEN:  N/A  COUNTS:  YES  TOURNIQUET:  * No tourniquets in log *  DICTATION: .Dragon Dictation  PLAN OF CARE: Admit to inpatient   PATIENT DISPOSITION:  PACU - hemodynamically stable.   Delay start of Pharmacological VTE agent (>24hrs) due to surgical blood loss or risk of bleeding: yes

## 2012-12-26 NOTE — Anesthesia Postprocedure Evaluation (Signed)
  Anesthesia Post-op Note  Patient: [name]  Procedure(s) Performed: Procedure(s) (LRB) with comments: POSTERIOR LUMBAR FUSION 1 LEVEL (N/A) - Lumbar Four-Five Lumbar Interbody Fusion with Telemon Cages, Pedicle Screws  Patient Location: PACU  Anesthesia Type:General  Level of Consciousness: awake, alert  and oriented  Airway and Oxygen Therapy: Patient Spontanous Breathing and Patient connected to nasal cannula oxygen  Post-op Pain: mild  Post-op Assessment: Post-op Vital signs reviewed, Patient's Cardiovascular Status Stable, Respiratory Function Stable, Patent Airway and Pain level controlled  Post-op Vital Signs: stable  Complications: No apparent anesthesia complications

## 2012-12-27 LAB — GLUCOSE, CAPILLARY

## 2012-12-27 MED ORDER — OXYCODONE-ACETAMINOPHEN 5-325 MG PO TABS: 1.0000 | ORAL_TABLET | ORAL | Status: DC | PRN

## 2012-12-27 MED ORDER — DIAZEPAM 5 MG PO TABS: 5.0000 mg | ORAL_TABLET | Freq: Four times a day (QID) | ORAL | Status: DC | PRN

## 2012-12-27 NOTE — Evaluation (Signed)
Occupational Therapy Evaluation Patient Details Name: [name] MRN: [id number] DOB: 06-27-1961 Today's Date: 12/27/2012 Time: 6213-0865 OT Time Calculation (min): 26 min  OT Assessment / Plan / Recommendation Clinical Impression  52 yo male s/p laminectomy L4-5 that does not require skilled Ot acutely. OT to sign off acutely.    OT Assessment  Patient does not need any further OT services    Follow Up Recommendations  No OT follow up    Barriers to Discharge      Equipment Recommendations  None recommended by OT    Recommendations for Other Services    Frequency       Precautions / Restrictions Precautions Precautions: Back Precaution Booklet Issued: Yes (comment) Precaution Comments: educated for proper technique in compliance with back precautions Required Braces or Orthoses: Spinal Brace Spinal Brace: Lumbar corset Restrictions Weight Bearing Restrictions: No   Pertinent Vitals/Pain RN Morrie Sheldon providing pain medication No pain medication since 4am per patient 4 out 10    ADL  Lower Body Dressing: Maximal assistance Where Assessed - Lower Body Dressing: Unsupported sitting (pt plans to have wife (A), wife agreeable) Toilet Transfer: Modified independent Toilet Transfer Method: Sit to Barista: Regular height toilet Toileting - Clothing Manipulation and Hygiene: Modified independent Where Assessed - Engineer, mining and Hygiene: Sit to stand from 3-in-1 or toilet Tub/Shower Transfer: Modified independent Tub/Shower Transfer Method: Ambulating Equipment Used: Gait belt;Back brace Transfers/Ambulation Related to ADLs: Pt ambulating MOD I with back brace ADL Comments: PT educated on adls with back precautions. Hand out provided. Pt demonstrated bed mobility, don of brace, need for help with LB dressing, ambulation and tub transfer. Pt and wife without any questions. Education complete    OT Diagnosis:    OT Problem List:     OT Treatment Interventions:     OT Goals    Visit Information  Last OT Received On: 12/27/12 Assistance Needed: +1 PT/OT Co-Evaluation/Treatment: Yes    Subjective Data  Subjective: "oh she is spoiled. she eats what I eat"- pt discussing his dog at home Patient Stated Goal: to return home and travel to [facility]   Prior Functioning     Home Living Lives With: Spouse Available Help at Discharge: Family Type of Home: House Home Access: Ramped entrance Home Layout: One level Bathroom Shower/Tub: Engineer, manufacturing systems: Handicapped height Home Adaptive Equipment: None Prior Function Level of Independence: Independent Able to Take Stairs?: Yes Driving: Yes Communication Communication: No difficulties Dominant Hand: Right         Vision/Perception     Cognition  Overall Cognitive Status: Appears within functional limits for tasks assessed/performed Arousal/Alertness: Awake/alert Orientation Level: Appears intact for tasks assessed;Oriented X4 / Intact Behavior During Session: [facility] for tasks performed    Extremity/Trunk Assessment Right Upper Extremity Assessment RUE ROM/Strength/Tone: Within functional levels Left Upper Extremity Assessment LUE ROM/Strength/Tone: Within functional levels Right Lower Extremity Assessment RLE ROM/Strength/Tone: WFL for tasks assessed Left Lower Extremity Assessment LLE ROM/Strength/Tone: [facility] for tasks assessed     Mobility Bed Mobility Bed Mobility: Rolling Right;Right Sidelying to Sit;Sitting - Scoot to Edge of Bed Rolling Right: 5: Set up;5: Supervision Right Sidelying to Sit: 4: Min assist;HOB flat Sitting - Scoot to Edge of Bed: 7: Independent Details for Bed Mobility Assistance: Min assist to sit upright initially for instruction purpose Transfers Sit to Stand: 6: Modified independent (Device/Increase time) Stand to Sit: 6: Modified independent (Device/Increase time);5: Supervision Details for Transfer  Assistance: takes more time to  perform sit to/from stand because of pain, but patient able to perform with minimal VC's     Shoulder Instructions     Exercise     Balance Balance Balance Assessed: Yes High Level Balance High Level Balance Activites: Side stepping;Backward walking;Direction changes;Turns;Sudden stops;Head turns High Level Balance Comments: patient steady with acitivities   End of Session OT - End of Session Activity Tolerance: Patient tolerated treatment well Patient left: in bed;with call bell/phone within reach;with family/visitor present Nurse Communication: Mobility status;Precautions  GO     Lucile Shutters 12/27/2012, 10:30 AM Pager: [phone]

## 2012-12-27 NOTE — Evaluation (Signed)
Physical Therapy Evaluation Patient Details Name: [name] MRN: [id number] DOB: September 04, 1961 Today's Date: 12/27/2012 Time: 1308-6578 PT Time Calculation (min): 26 min  PT Assessment / Plan / Recommendation Clinical Impression  Patient is a 52 y.o. male s/p lumbar spine sx. Patient demonstrates modest limitiations in functional mobility secondary to pain, motion restrictions and weakness. Anticipate patient will continue to progress activity without difficulty.  Patient able to perform safe ambulation and stair negotiation at this time. Patient and spouse educated on mobility expectations, precaution compliance and techniques for safe mobility upon discharge.  Patient and spouse verbalize understanding and appreciate educational information provided.  Do not feel patient will need continued PT at this time. Rec d/c home.    PT Assessment  Patent does not need any further PT services    Follow Up Recommendations  No PT follow up          Equipment Recommendations  None recommended by PT          Precautions / Restrictions Precautions Precautions: Back Precaution Booklet Issued: Yes (comment) Precaution Comments: educated for proper technique in compliance with back precautions Required Braces or Orthoses: Spinal Brace Spinal Brace: Lumbar corset Restrictions Weight Bearing Restrictions: No   Pertinent Vitals/Pain 4/10      Mobility  Bed Mobility Bed Mobility: Rolling Right;Right Sidelying to Sit;Sitting - Scoot to Edge of Bed Rolling Right: 5: Set up;5: Supervision (to instruct on knee flexion and body positioning initially) Right Sidelying to Sit: 4: Min assist;HOB flat Sitting - Scoot to Edge of Bed: 7: Independent Details for Bed Mobility Assistance: Min assist to sit upright initially for instruction purpose Transfers Transfers: Sit to Stand;Stand to Sit Sit to Stand: 6: Modified independent (Device/Increase time) Stand to Sit: 6: Modified independent  (Device/Increase time);5: Supervision Details for Transfer Assistance: takes more time to perform sit to/from stand because of pain, but patient able to perform with minimal VC's Ambulation/Gait Ambulation/Gait Assistance: 5: Supervision Ambulation Distance (Feet): 160 Feet Assistive device: None Gait Pattern: Step-through pattern;Decreased stride length Gait velocity: decreased General Gait Details: patient very rigid with ambulation Stairs: Yes Stairs Assistance: 4: Min guard Stair Management Technique: One rail Right;Forwards Number of Stairs: 12       PT Goals Acute Rehab PT Goals PT Goal Formulation: With patient  Visit Information  Last PT Received On: 12/27/12 Assistance Needed: +1    Subjective Data  Subjective: "I feel pretty good" Patient Stated Goal: to go home   Prior Functioning  Home Living Lives With: Spouse Available Help at Discharge: Family Type of Home: House Home Access: Ramped entrance Bathroom Shower/Tub: Engineer, manufacturing systems: Handicapped height Prior Function Level of Independence: Independent Able to Take Stairs?: Yes Driving: Yes    Cognition  Overall Cognitive Status: Appears within functional limits for tasks assessed/performed Arousal/Alertness: Awake/alert Orientation Level: Appears intact for tasks assessed;Oriented X4 / Intact Behavior During Session: [facility] for tasks performed    Extremity/Trunk Assessment Right Upper Extremity Assessment RUE ROM/Strength/Tone: Within functional levels Left Upper Extremity Assessment LUE ROM/Strength/Tone: Within functional levels Right Lower Extremity Assessment RLE ROM/Strength/Tone: [facility] for tasks assessed Left Lower Extremity Assessment LLE ROM/Strength/Tone: WFL for tasks assessed   Balance Balance Balance Assessed: Yes High Level Balance High Level Balance Activites: Side stepping;Backward walking;Direction changes;Turns;Sudden stops;Head turns High Level Balance Comments: patient  steady with acitivities  End of Session PT - End of Session Equipment Utilized During Treatment: Gait belt;Back brace Activity Tolerance: Patient tolerated treatment well Patient left: in bed;with family/visitor  present (sitting EOB) Nurse Communication: Mobility status;Patient requests pain meds  GP     Fabio Asa 12/27/2012, 10:21 AM Charlotte Crumb, PT DPT  [phone]

## 2012-12-27 NOTE — Progress Notes (Signed)
Pt and wife given D/C instructions with Rx's. Pt verbalized understanding of teaching. Pt D/C'd home via wheelchair with wife @ 1035 per MD order. Rema Fendt, RN

## 2012-12-27 NOTE — Discharge Summary (Signed)
Physician Discharge Summary  Patient ID: [name] MRN: [id number] DOB/AGE: December 10, 1961 52 y.o.  Admit date: 12/26/2012 Discharge date: 12/27/2012  Admission Diagnoses:  Discharge Diagnoses:  Principal Problem:  *Lumbar stenosis with neurogenic claudication   Discharged Condition: good  Hospital Course: Patient admitted to the hospital where he underwent an uncomplicated L4-5 decompression and fusion. Postoperatively he is done very well. Back and lower extremity pain much improved. Ambulating without difficulty. Ready for discharge home.  Consults:   Significant Diagnostic Studies:   Treatments:   Discharge Exam: Blood pressure 101/62, pulse 87, temperature 97.9 F (36.6 C), temperature source Oral, resp. rate 18, SpO2 99.00%. Awake and alert. Oriented and appropriate. Cranial nerve function is intact. Motor and sensory function of the extremities normal. Wound clean dry and intact. Chest and abdomen benign.  Disposition: ED Dismiss - Diverted Elsewhere     Medication List     As of 12/27/2012  9:35 AM    TAKE these medications         diazepam 5 MG tablet   Commonly known as: VALIUM   Take 1-2 tablets (5-10 mg total) by mouth every 6 (six) hours as needed.      esomeprazole 40 MG capsule   Commonly known as: NEXIUM   Take 40 mg by mouth daily before breakfast.      ezetimibe 10 MG tablet   Commonly known as: ZETIA   Take 10 mg by mouth daily.      fish oil-omega-3 fatty acids 1000 MG capsule   Take 1 g by mouth 2 (two) times daily.      fluticasone 50 MCG/ACT nasal spray   Commonly known as: FLONASE   Place 1 spray into the nose daily.      loratadine 10 MG tablet   Commonly known as: CLARITIN   Take 10 mg by mouth daily.      metFORMIN 1000 MG tablet   Commonly known as: GLUCOPHAGE   Take 1,000 mg by mouth at bedtime.      multivitamin with minerals Tabs   Take 1 tablet by mouth daily.      olmesartan-hydrochlorothiazide 40-25 MG per tablet   Commonly known as: BENICAR HCT   Take 1 tablet by mouth daily.      olopatadine 0.1 % ophthalmic solution   Commonly known as: PATANOL   Place 1 drop into both eyes 2 (two) times daily.      oxyCODONE-acetaminophen 5-325 MG per tablet   Commonly known as: PERCOCET/ROXICET   Take 1-2 tablets by mouth every 4 (four) hours as needed.      sertraline 50 MG tablet   Commonly known as: ZOLOFT   Take 50 mg by mouth daily.      Tamsulosin HCl 0.4 MG Caps   Commonly known as: FLOMAX   Take 0.4 mg by mouth daily.      zolpidem 10 MG tablet   Commonly known as: AMBIEN   Take 10 mg by mouth at bedtime as needed. For sleep           Follow-up Information    Follow up with Jaimon Bugaj A, MD. Call in 1 week. (Ask for Lurena Joiner)    Contact information:   [address] [place] [address] [phone]          Signed: Garron Eline A 12/27/2012, 9:35 AM

## 2012-12-27 NOTE — Progress Notes (Signed)
Utilization review completed. Amaro Mangold, RN, BSN. 

## 2012-12-28 LAB — GLUCOSE, CAPILLARY: Glucose-Capillary: 175 mg/dL — ABNORMAL HIGH (ref 70–99)

## 2012-12-28 MED FILL — Heparin Sodium (Porcine) Inj 1000 Unit/ML: INTRAMUSCULAR | Qty: 30 | Status: AC

## 2012-12-28 MED FILL — Sodium Chloride Irrigation Soln 0.9%: Qty: 3000 | Status: AC

## 2012-12-28 MED FILL — Sodium Chloride IV Soln 0.9%: INTRAVENOUS | Qty: 1000 | Status: AC

## 2013-03-05 ENCOUNTER — Telehealth: Payer: Self-pay | Admitting: Nurse Practitioner

## 2013-03-05 ENCOUNTER — Telehealth: Payer: Self-pay | Admitting: General Practice

## 2013-03-05 NOTE — Telephone Encounter (Signed)
Return call

## 2013-03-05 NOTE — Telephone Encounter (Signed)
APPT MADE

## 2013-03-05 NOTE — Telephone Encounter (Signed)
Wtbs, med rck

## 2013-03-16 ENCOUNTER — Ambulatory Visit (INDEPENDENT_AMBULATORY_CARE_PROVIDER_SITE_OTHER): Payer: 59 | Admitting: Nurse Practitioner

## 2013-03-16 ENCOUNTER — Encounter: Payer: Self-pay | Admitting: Nurse Practitioner

## 2013-03-16 VITALS — BP 105/73 | HR 76 | Temp 97.2°F | Ht 71.0 in | Wt 220.0 lb

## 2013-03-16 DIAGNOSIS — E119 Type 2 diabetes mellitus without complications: Secondary | ICD-10-CM | POA: Insufficient documentation

## 2013-03-16 DIAGNOSIS — F32A Depression, unspecified: Secondary | ICD-10-CM | POA: Insufficient documentation

## 2013-03-16 DIAGNOSIS — I10 Essential (primary) hypertension: Secondary | ICD-10-CM | POA: Insufficient documentation

## 2013-03-16 DIAGNOSIS — F329 Major depressive disorder, single episode, unspecified: Secondary | ICD-10-CM | POA: Insufficient documentation

## 2013-03-16 DIAGNOSIS — E785 Hyperlipidemia, unspecified: Secondary | ICD-10-CM

## 2013-03-16 DIAGNOSIS — E1169 Type 2 diabetes mellitus with other specified complication: Secondary | ICD-10-CM | POA: Insufficient documentation

## 2013-03-16 LAB — COMPLETE METABOLIC PANEL WITH GFR
ALT: 64 U/L — ABNORMAL HIGH (ref 0–53)
AST: 134 U/L — ABNORMAL HIGH (ref 0–37)
Albumin: 5 g/dL (ref 3.5–5.2)
Alkaline Phosphatase: 48 U/L (ref 39–117)
Glucose, Bld: 107 mg/dL — ABNORMAL HIGH (ref 70–99)
Potassium: 4.4 mEq/L (ref 3.5–5.3)
Sodium: 139 mEq/L (ref 135–145)
Total Bilirubin: 0.6 mg/dL (ref 0.3–1.2)
Total Protein: 7.5 g/dL (ref 6.0–8.3)

## 2013-03-16 LAB — POCT GLYCOSYLATED HEMOGLOBIN (HGB A1C): Hemoglobin A1C: 5.8

## 2013-03-16 MED ORDER — SERTRALINE HCL 100 MG PO TABS: 100.0000 mg | ORAL_TABLET | Freq: Every day | ORAL | Status: DC

## 2013-03-16 NOTE — Patient Instructions (Signed)
Diets for Diabetes, Food Labeling Look at food labels to help you decide how much of a product you can eat. You will want to check the amount of total carbohydrate in a serving to see how the food fits into your meal plan. In the list of ingredients, the ingredient present in the largest amount by weight must be listed first, followed by the other ingredients in descending order. STANDARD OF IDENTITY Most products have a list of ingredients. However, foods that the Food and Drug Administration (FDA) has given a standard of identity do not need a list of ingredients. A standard of identity means that a food must contain certain ingredients if it is called a particular name. Examples are mayonnaise, peanut butter, ketchup, jelly, and cheese. LABELING TERMS There are many terms found on food labels. Some of these terms have specific definitions. Some terms are regulated by the FDA, and the FDA has clearly specified how they can be used. Others are not regulated or well-defined and can be misleading and confusing. SPECIFICALLY DEFINED TERMS Nutritive Sweetener.  A sweetener that contains calories,such as table sugar or honey. Nonnutritive Sweetener.  A sweetener with few or no calories,such as saccharin, aspartame, sucralose, and cyclamate. LABELING TERMS REGULATED BY THE FDA Free.  The product contains only a tiny or small amount of fat, cholesterol, sodium, sugar, or calories. For example, a "fat-free" product will contain less than 0.5 g of fat per serving. Low.  A food described as "low" in fat, saturated fat, cholesterol, sodium, or calories could be eaten fairly often without exceeding dietary guidelines. For example, "low in fat" means no more than 3 g of fat per serving. Lean.  "Lean" and "extra lean" are U.S. Department of Agriculture (USDA) terms for use on meat and poultry products. "Lean" means the product contains less than 10 g of fat, 4 g of saturated fat, and 95 mg of cholesterol  per serving. "Lean" is not as low in fat as a product labeled "low." Extra Lean.  "Extra lean" means the product contains less than 5 g of fat, 2 g of saturated fat, and 95 mg of cholesterol per serving. While "extra lean" has less fat than "lean," it is still higher in fat than a product labeled "low." Reduced, Less, Fewer.  A diet product that contains 25% less of a nutrient or calories than the regular version. For example, hot dogs might be labeled "25% less fat than our regular hot dogs." Light/Lite.  A diet product that contains  fewer calories or  the fat of the original. For example, "light in sodium" means a product with  the usual sodium. More.  One serving contains at least 10% more of the daily value of a vitamin, mineral, or fiber than usual. Good Source Of.  One serving contains 10% to 19% of the daily value for a particular vitamin, mineral, or fiber. Excellent Source Of.  One serving contains 20% or more of the daily value for a particular nutrient. Other terms used might be "high in" or "rich in." Enriched or Fortified.  The product contains added vitamins, minerals, or protein. Nutrition labeling must be used on enriched or fortified foods. Imitation.  The product has been altered so that it is lower in protein, vitamins, or minerals than the usual food,such as imitation peanut butter. Total Fat.  The number listed is the total of all fat found in a serving of the product. Under total fat, food labels must list saturated fat and   trans fat, which are associated with raising bad cholesterol and an increased risk of heart blood vessel disease. Saturated Fat.  Mainly fats from animal-based sources. Some examples are red meat, cheese, cream, whole milk, and coconut oil. Trans Fat.  Found in some fried snack foods, packaged foods, and fried restaurant foods. It is recommended you eat as close to 0 g of trans fat as possible, since it raises bad cholesterol and lowers  good cholesterol. Polyunsaturated and Monounsaturated Fats.  More healthful fats. These fats are from plant sources. Total Carbohydrate.  The number of carbohydrate grams in a serving of the product. Under total carbohydrate are listed the other carbohydrate sources, such as dietary fiber and sugars. Dietary Fiber.  A carbohydrate from plant sources. Sugars.  Sugars listed on the label contain all naturally occurring sugars as well as added sugars. LABELING TERMS NOT REGULATED BY THE FDA Sugarless.  Table sugar (sucrose) has not been added. However, the manufacturer may use another form of sugar in place of sucrose to sweeten the product. For example, sugar alcohols are used to sweeten foods. Sugar alcohols are a form of sugar but are not table sugar. If a product contains sugar alcohols in place of sucrose, it can still be labeled "sugarless." Low Salt, Salt-Free, Unsalted, No Salt, No Salt Added, Without Added Salt.  Food that is usually processed with salt has been made without salt. However, the food may contain sodium-containing additives, such as preservatives, leavening agents, or flavorings. Natural.  This term has no legal meaning. Organic.  Foods that are certified as organic have been inspected and approved by the USDA to ensure they are produced without pesticides, fertilizers containing synthetic ingredients, bioengineering, or ionizing radiation. Document Released: 12/02/2003 Document Revised: 02/21/2012 Document Reviewed: 06/19/2009 [employer] Patient Information 2013 [employer].  

## 2013-03-16 NOTE — Progress Notes (Signed)
Subjective:    Patient ID: [name], male    DOB: November 25, 1961, 52 y.o.   MRN: [id number]  Diabetes He presents for his follow-up diabetic visit. He has type 2 diabetes mellitus. The initial diagnosis of diabetes was made 2 years ago. His disease course has been stable. Pertinent negatives for hypoglycemia include no dizziness, headaches or tremors. Pertinent negatives for diabetes include no blurred vision, no chest pain, no fatigue, no polydipsia, no polyphagia, no polyuria and no weight loss. There are no hypoglycemic complications. There are no diabetic complications. Risk factors for coronary artery disease include dyslipidemia and male sex. Current diabetic treatment includes oral agent (monotherapy). He is compliant with treatment all of the time. His weight is stable. He is following a generally healthy diet. When asked about meal planning, he reported none. He participates in exercise daily. He monitors blood glucose at home 1-2 x per day. Blood glucose monitoring compliance is excellent. His overall blood glucose range is 130-140 mg/dl. An ACE inhibitor/angiotensin II receptor blocker is not being taken. Eye exam is not current.  Hypertension This is a chronic problem. The current episode started more than 1 year ago. The problem has been resolved since onset. The problem is controlled. Pertinent negatives include no blurred vision, chest pain, headaches or peripheral edema. Risk factors for coronary artery disease include diabetes mellitus and male gender. Past treatments include diuretics. There are no compliance problems.   Hyperlipidemia This is a chronic problem. The current episode started more than 1 year ago. The problem is uncontrolled. Recent lipid tests were reviewed and are high. Pertinent negatives include no chest pain, leg pain or myalgias. Current antihyperlipidemic treatment includes ezetimibe. There are no compliance problems.  Risk factors for coronary artery disease include  diabetes mellitus, hypertension and male sex.   Depression Pt on Zoloft but feels it needs to be increased  Insomnia Pt having harder time sleeping at night.  Currently on Zoloft. Stated he increased dose to 100 mg and sleep through night.   Review of Systems  Constitutional: Negative for weight loss and fatigue.  Eyes: Negative for blurred vision.  Cardiovascular: Negative for chest pain.  Endocrine: Negative for polydipsia, polyphagia and polyuria.  Musculoskeletal: Negative for myalgias.  Neurological: Negative for dizziness, tremors and headaches.  All other systems reviewed and are negative.       Objective:   Physical Exam  Constitutional: He is oriented to person, place, and time. He appears well-developed and well-nourished.  HENT:  Head: Normocephalic.  Right Ear: External ear normal.  Left Ear: External ear normal.  Nose: Nose normal.  Mouth/Throat: Oropharynx is clear and moist.  Eyes: EOM are normal. Pupils are equal, round, and reactive to light.  Neck: Normal range of motion. Neck supple. No thyromegaly present.  Cardiovascular: Normal rate, regular rhythm, normal heart sounds and intact distal pulses.   No murmur heard. Pulmonary/Chest: Effort normal and breath sounds normal. He has no wheezes. He has no rales.  Abdominal: Soft. Bowel sounds are normal.  Genitourinary: Prostate normal and penis normal.  Musculoskeletal: Normal range of motion.  Neurological: He is alert and oriented to person, place, and time.  + 4/4 monofilament bil No callous formation  Skin: Skin is warm and dry.  Psychiatric: He has a normal mood and affect. His behavior is normal. Judgment and thought content normal.    BP 105/73  Pulse 76  Temp(Src) 97.2 F (36.2 C)  Ht 5\' 11"  (1.803 m)  Wt 220 lb (  99.791 kg)  BMI 30.7 kg/m2  Results for orders placed in visit on 03/16/13  POCT GLYCOSYLATED HEMOGLOBIN (HGB A1C)      Result Value Range   Hemoglobin A1C 5.8           Assessment & Plan:  3. Other and unspecified hyperlipidemia Low fat diet - POCT glycosylated hemoglobin (Hb A1C) - COMPLETE METABOLIC PANEL WITH GFR - NMR Lipoprofile with Lipids  4. Essential hypertension, benign Low Na+ Diet Exercise - COMPLETE METABOLIC PANEL WITH GFR - NMR Lipoprofile with Lipids  5. Type II or unspecified type diabetes mellitus without mention of complication, not stated as uncontrolled Continue monitoring blood sugars in the morning before eating Check feet daily Diet/exercise - POCT glycosylated hemoglobin (Hb A1C)  6. Depression - sertraline (ZOLOFT) 100 MG tablet; Take 1 tablet (100 mg total) by mouth daily.  Dispense: 30 tablet; Refill: 3  Continue current meds  Mary-Margaret Daphine Deutscher, FNP

## 2013-03-19 LAB — NMR LIPOPROFILE WITH LIPIDS
Cholesterol, Total: 211 mg/dL — ABNORMAL HIGH (ref <200)
HDL Particle Number: 33.5 umol/L (ref >=30.5)
HDL Size: 8.6 nm — ABNORMAL LOW (ref >=9.2)
HDL-C: 50 mg/dL (ref >=40)
Large HDL-P: 3.8 umol/L — ABNORMAL LOW (ref >=4.8)
Large VLDL-P: 8.6 nmol/L — ABNORMAL HIGH (ref <=2.7)
Triglycerides: 176 mg/dL — ABNORMAL HIGH (ref <150)

## 2013-03-21 ENCOUNTER — Telehealth: Payer: Self-pay | Admitting: Nurse Practitioner

## 2013-03-22 ENCOUNTER — Other Ambulatory Visit: Payer: Self-pay | Admitting: Nurse Practitioner

## 2013-03-22 MED ORDER — SIMVASTATIN 40 MG PO TABS: 40.0000 mg | ORAL_TABLET | Freq: Every day | ORAL | Status: DC

## 2013-04-26 NOTE — Telephone Encounter (Signed)
Pt call was returned per Rudene Christians RN and an appointment to be seen was made.

## 2013-05-22 NOTE — Telephone Encounter (Signed)
error 

## 2013-05-26 ENCOUNTER — Other Ambulatory Visit: Payer: Self-pay | Admitting: *Deleted

## 2013-05-26 MED ORDER — TAMSULOSIN HCL 0.4 MG PO CAPS: 0.4000 mg | ORAL_CAPSULE | Freq: Every day | ORAL | Status: DC

## 2013-05-26 MED ORDER — ZOLPIDEM TARTRATE 10 MG PO TABS: 10.0000 mg | ORAL_TABLET | Freq: Every evening | ORAL | Status: DC | PRN

## 2013-05-26 NOTE — Telephone Encounter (Signed)
Last seen 03/16/13, next appt 06/18/13 last filled 03/06/13 for #90. If approved have nurse call into Drug store

## 2013-05-28 ENCOUNTER — Telehealth: Payer: Self-pay | Admitting: Nurse Practitioner

## 2013-05-28 NOTE — Telephone Encounter (Signed)
Sent note to pool B

## 2013-05-28 NOTE — Telephone Encounter (Signed)
Has this been done?

## 2013-05-29 NOTE — Telephone Encounter (Signed)
Called in.

## 2013-06-11 ENCOUNTER — Other Ambulatory Visit: Payer: Self-pay | Admitting: *Deleted

## 2013-06-11 MED ORDER — EZETIMIBE 10 MG PO TABS: 10.0000 mg | ORAL_TABLET | Freq: Every day | ORAL | Status: DC

## 2013-06-18 ENCOUNTER — Ambulatory Visit: Payer: 59 | Admitting: Nurse Practitioner

## 2013-06-27 ENCOUNTER — Encounter: Payer: Self-pay | Admitting: *Deleted

## 2013-06-27 ENCOUNTER — Telehealth: Payer: Self-pay | Admitting: Nurse Practitioner

## 2013-06-27 ENCOUNTER — Other Ambulatory Visit: Payer: Self-pay

## 2013-06-27 MED ORDER — ZOLPIDEM TARTRATE 10 MG PO TABS: 10.0000 mg | ORAL_TABLET | Freq: Every evening | ORAL | Status: DC | PRN

## 2013-06-27 NOTE — Telephone Encounter (Signed)
Please call in rx for ambien with 2 refills  

## 2013-06-27 NOTE — Telephone Encounter (Signed)
Last seen 03/16/13  Last filled 05/26/13  MMM   If approved route to nurse to call in and notify patient

## 2013-06-27 NOTE — Telephone Encounter (Signed)
Done 06/26/13

## 2013-06-28 NOTE — Telephone Encounter (Signed)
rx called into pharmacy

## 2013-07-09 ENCOUNTER — Other Ambulatory Visit: Payer: Self-pay

## 2013-07-09 MED ORDER — METFORMIN HCL 1000 MG PO TABS: 1000.0000 mg | ORAL_TABLET | Freq: Every day | ORAL | Status: DC

## 2013-07-09 MED ORDER — TAMSULOSIN HCL 0.4 MG PO CAPS: 0.4000 mg | ORAL_CAPSULE | Freq: Every day | ORAL | Status: DC

## 2013-07-25 ENCOUNTER — Ambulatory Visit (INDEPENDENT_AMBULATORY_CARE_PROVIDER_SITE_OTHER): Payer: 59 | Admitting: Nurse Practitioner

## 2013-07-25 ENCOUNTER — Encounter: Payer: Self-pay | Admitting: Nurse Practitioner

## 2013-07-25 VITALS — BP 119/82 | HR 74 | Temp 98.1°F | Ht 70.0 in | Wt 236.0 lb

## 2013-07-25 DIAGNOSIS — Z23 Encounter for immunization: Secondary | ICD-10-CM

## 2013-07-25 DIAGNOSIS — F329 Major depressive disorder, single episode, unspecified: Secondary | ICD-10-CM

## 2013-07-25 DIAGNOSIS — J309 Allergic rhinitis, unspecified: Secondary | ICD-10-CM | POA: Insufficient documentation

## 2013-07-25 DIAGNOSIS — I10 Essential (primary) hypertension: Secondary | ICD-10-CM

## 2013-07-25 DIAGNOSIS — G4700 Insomnia, unspecified: Secondary | ICD-10-CM

## 2013-07-25 DIAGNOSIS — K219 Gastro-esophageal reflux disease without esophagitis: Secondary | ICD-10-CM

## 2013-07-25 DIAGNOSIS — E785 Hyperlipidemia, unspecified: Secondary | ICD-10-CM

## 2013-07-25 DIAGNOSIS — E119 Type 2 diabetes mellitus without complications: Secondary | ICD-10-CM

## 2013-07-25 LAB — POCT GLYCOSYLATED HEMOGLOBIN (HGB A1C): Hemoglobin A1C: 6.3

## 2013-07-25 MED ORDER — EZETIMIBE 10 MG PO TABS: 10.0000 mg | ORAL_TABLET | Freq: Every day | ORAL | Status: DC

## 2013-07-25 MED ORDER — OLOPATADINE HCL 0.1 % OP SOLN: 1.0000 [drp] | Freq: Two times a day (BID) | OPHTHALMIC | Status: DC

## 2013-07-25 MED ORDER — ESOMEPRAZOLE MAGNESIUM 40 MG PO CPDR: 40.0000 mg | DELAYED_RELEASE_CAPSULE | Freq: Every day | ORAL | Status: DC

## 2013-07-25 MED ORDER — LORATADINE 10 MG PO TABS: 10.0000 mg | ORAL_TABLET | Freq: Every day | ORAL | Status: DC

## 2013-07-25 MED ORDER — METFORMIN HCL 1000 MG PO TABS: 1000.0000 mg | ORAL_TABLET | Freq: Every day | ORAL | Status: DC

## 2013-07-25 MED ORDER — OLMESARTAN MEDOXOMIL-HCTZ 40-25 MG PO TABS: 1.0000 | ORAL_TABLET | Freq: Every day | ORAL | Status: DC

## 2013-07-25 MED ORDER — ZOLPIDEM TARTRATE 10 MG PO TABS: 10.0000 mg | ORAL_TABLET | Freq: Every evening | ORAL | Status: DC | PRN

## 2013-07-25 MED ORDER — GLUCOSE BLOOD VI STRP: ORAL_STRIP | Status: DC

## 2013-07-25 MED ORDER — FLUTICASONE PROPIONATE 50 MCG/ACT NA SUSP: 1.0000 | Freq: Every day | NASAL | Status: DC

## 2013-07-25 MED ORDER — SIMVASTATIN 40 MG PO TABS: 40.0000 mg | ORAL_TABLET | Freq: Every day | ORAL | Status: DC

## 2013-07-25 MED ORDER — SERTRALINE HCL 100 MG PO TABS: 100.0000 mg | ORAL_TABLET | Freq: Every day | ORAL | Status: DC

## 2013-07-25 NOTE — Patient Instructions (Signed)
Health Maintenance, Males A healthy lifestyle and preventative care can promote health and wellness.  Maintain regular health, dental, and eye exams.  Eat a healthy diet. Foods like vegetables, fruits, whole grains, low-fat dairy products, and lean protein foods contain the nutrients you need without too many calories. Decrease your intake of foods high in solid fats, added sugars, and salt. Get information about a proper diet from your caregiver, if necessary.  Regular physical exercise is one of the most important things you can do for your health. Most adults should get at least 150 minutes of moderate-intensity exercise (any activity that increases your heart rate and causes you to sweat) each week. In addition, most adults need muscle-strengthening exercises on 2 or more days a week.   Maintain a healthy weight. The body mass index (BMI) is a screening tool to identify possible weight problems. It provides an estimate of body fat based on height and weight. Your caregiver can help determine your BMI, and can help you achieve or maintain a healthy weight. For adults 20 years and older:  A BMI below 18.5 is considered underweight.  A BMI of 18.5 to 24.9 is normal.  A BMI of 25 to 29.9 is considered overweight.  A BMI of 30 and above is considered obese.  Maintain normal blood lipids and cholesterol by exercising and minimizing your intake of saturated fat. Eat a balanced diet with plenty of fruits and vegetables. Blood tests for lipids and cholesterol should begin at age 20 and be repeated every 5 years. If your lipid or cholesterol levels are high, you are over 50, or you are a high risk for heart disease, you may need your cholesterol levels checked more frequently.Ongoing high lipid and cholesterol levels should be treated with medicines, if diet and exercise are not effective.  If you smoke, find out from your caregiver how to quit. If you do not use tobacco, do not start.  If you  choose to drink alcohol, do not exceed 2 drinks per day. One drink is considered to be 12 ounces (355 mL) of beer, 5 ounces (148 mL) of wine, or 1.5 ounces (44 mL) of liquor.  Avoid use of street drugs. Do not share needles with anyone. Ask for help if you need support or instructions about stopping the use of drugs.  High blood pressure causes heart disease and increases the risk of stroke. Blood pressure should be checked at least every 1 to 2 years. Ongoing high blood pressure should be treated with medicines if weight loss and exercise are not effective.  If you are 45 to 54 years old, ask your caregiver if you should take aspirin to prevent heart disease.  Diabetes screening involves taking a blood sample to check your fasting blood sugar level. This should be done once every 3 years, after age 45, if you are within normal weight and without risk factors for diabetes. Testing should be considered at a younger age or be carried out more frequently if you are overweight and have at least 1 risk factor for diabetes.  Colorectal cancer can be detected and often prevented. Most routine colorectal cancer screening begins at the age of 50 and continues through age 75. However, your caregiver may recommend screening at an earlier age if you have risk factors for colon cancer. On a yearly basis, your caregiver may provide home test kits to check for hidden blood in the stool. Use of a small camera at the end of a tube,   to directly examine the colon (sigmoidoscopy or colonoscopy), can detect the earliest forms of colorectal cancer. Talk to your caregiver about this at age 50, when routine screening begins. Direct examination of the colon should be repeated every 5 to 10 years through age 75, unless early forms of pre-cancerous polyps or small growths are found.  Hepatitis C blood testing is recommended for all people born from 1945 through 1965 and any individual with known risks for hepatitis C.  Healthy  men should no longer receive prostate-specific antigen (PSA) blood tests as part of routine cancer screening. Consult with your caregiver about prostate cancer screening.  Testicular cancer screening is not recommended for adolescents or adult males who have no symptoms. Screening includes self-exam, caregiver exam, and other screening tests. Consult with your caregiver about any symptoms you have or any concerns you have about testicular cancer.  Practice safe sex. Use condoms and avoid high-risk sexual practices to reduce the spread of sexually transmitted infections (STIs).  Use sunscreen with a sun protection factor (SPF) of 30 or greater. Apply sunscreen liberally and repeatedly throughout the day. You should seek shade when your shadow is shorter than you. Protect yourself by wearing long sleeves, pants, a wide-brimmed hat, and sunglasses year round, whenever you are outdoors.  Notify your caregiver of new moles or changes in moles, especially if there is a change in shape or color. Also notify your caregiver if a mole is larger than the size of a pencil eraser.  A one-time screening for abdominal aortic aneurysm (AAA) and surgical repair of large AAAs by sound wave imaging (ultrasonography) is recommended for ages 65 to 75 years who are current or former smokers.  Stay current with your immunizations. Document Released: 05/27/2008 Document Revised: 02/21/2012 Document Reviewed: 04/26/2011 [employer] Patient Information 2014 [employer].  

## 2013-07-25 NOTE — Progress Notes (Signed)
Subjective:    Patient ID: [name], male    DOB: [birth year], 52 y.o.   MRN: [id number]  Hypertension This is a chronic problem. The current episode started more than 1 year ago. The problem is unchanged. The problem is controlled. Pertinent negatives include no blurred vision, chest pain, headaches, neck pain, palpitations, peripheral edema, shortness of breath or sweats. There are no associated agents to hypertension. Risk factors for coronary artery disease include diabetes mellitus, dyslipidemia and male gender. Past treatments include angiotensin blockers and diuretics. Compliance problems include diet and exercise.   Hyperlipidemia This is a chronic problem. The current episode started more than 1 year ago. The problem is uncontrolled. Recent lipid tests were reviewed and are high. Exacerbating diseases include diabetes and obesity. He has no history of hypothyroidism or liver disease. Factors aggravating his hyperlipidemia include thiazides. Pertinent negatives include no chest pain or shortness of breath. Current antihyperlipidemic treatment includes statins. The current treatment provides moderate improvement of lipids. Risk factors for coronary artery disease include diabetes mellitus, hypertension and male sex.  Diabetes He presents for his follow-up diabetic visit. He has type 2 diabetes mellitus. No MedicAlert identification noted. The initial diagnosis of diabetes was made 3 years ago. His disease course has been stable. There are no hypoglycemic associated symptoms. Pertinent negatives for hypoglycemia include no headaches or sweats. Pertinent negatives for diabetes include no blurred vision, no chest pain, no polydipsia, no polyphagia, no polyuria, no visual change, no weakness and no weight loss. There are no hypoglycemic complications. Symptoms are stable. There are no diabetic complications. Risk factors for coronary artery disease include dyslipidemia, hypertension and male sex.  Current diabetic treatment includes oral agent (monotherapy). He is compliant with treatment most of the time. His weight is stable. When asked about meal planning, he reported none. He has not had a previous visit with a dietician. He participates in exercise weekly. There is no change in his home blood glucose trend. His breakfast blood glucose is taken between 7-8 am. His breakfast blood glucose range is generally 140-180 mg/dl. His overall blood glucose range is 140-180 mg/dl. An ACE inhibitor/angiotensin II receptor blocker is being taken. He does not see a podiatrist.Eye exam is not current (3 years ago).  Allergic rhinitis Acting up right now because patient ran out of claritin and flonase. Gerd Nexium working well for symptoms BPH [name]- ON flomax and that is working well Depression Keeps him from worrying so much- started taking after his parents died DDD Patient recently had back surgery- takes valium occasionally and pain meds occassional- doing much better since surgery.   Review of Systems  Constitutional: Negative for weight loss.  HENT: Negative for neck pain.   Eyes: Negative for blurred vision.  Respiratory: Negative for shortness of breath.   Cardiovascular: Negative for chest pain and palpitations.  Endocrine: Negative for polydipsia, polyphagia and polyuria.  Neurological: Negative for weakness and headaches.  All other systems reviewed and are negative.       Objective:   Physical Exam  Constitutional: He is oriented to person, place, and time. He appears well-developed and well-nourished.  HENT:  Head: Normocephalic.  Right Ear: External ear normal.  Left Ear: External ear normal.  Nose: Nose normal.  Mouth/Throat: Oropharynx is clear and moist.  Eyes: EOM are normal. Pupils are equal, round, and reactive to light.  Neck: Normal range of motion. Neck supple. No thyromegaly present.  Cardiovascular: Normal rate, regular rhythm, normal heart  sounds and  intact distal pulses.   No murmur heard. Pulmonary/Chest: Effort normal and breath sounds normal. He has no wheezes. He has no rales.  Abdominal: Soft. Bowel sounds are normal.  Genitourinary:  (Prostate checked by urologist)  Musculoskeletal: Normal range of motion.  Neurological: He is alert and oriented to person, place, and time.  Skin: Skin is warm and dry.  Psychiatric: He has a normal mood and affect. His behavior is normal. Judgment and thought content normal.   BP 119/82  Pulse 74  Temp(Src) 98.1 F (36.7 C) (Oral)  Ht 5\' 10"  (1.778 m)  Wt 236 lb (107.049 kg)  BMI 33.86 kg/m2  Results for orders placed in visit on 07/25/13  POCT GLYCOSYLATED HEMOGLOBIN (HGB A1C)      Result Value Range   Hemoglobin A1C 6.3          Assessment & Plan:   1. Diabetes   2. Allergic rhinitis   3. Insomnia   4. Depression   5. Hypertension   6. Hyperlipidemia with target LDL less than 100   7. GERD (gastroesophageal reflux disease)    I have already ordered this as a future order in the computer. Meds ordered this encounter  Medications  . meloxicam (MOBIC) 15 MG tablet    Sig:   . DISCONTD: oxyCODONE-acetaminophen (PERCOCET/ROXICET) 5-325 MG per tablet    Sig:   . olopatadine (PATANOL) 0.1 % ophthalmic solution    Sig: Place 1 drop into both eyes 2 (two) times daily.    Dispense:  5 mL    Refill:  5  . fluticasone (FLONASE) 50 MCG/ACT nasal spray    Sig: Place 1 spray into the nose daily.    Dispense:  16 g    Refill:  5  . olmesartan-hydrochlorothiazide (BENICAR HCT) 40-25 MG per tablet    Sig: Take 1 tablet by mouth daily.    Dispense:  30 tablet    Refill:  5    Order Specific Question:  Supervising Provider    Answer:  Ernestina Penna [[id number]]  . metFORMIN (GLUCOPHAGE) 1000 MG tablet    Sig: Take 1 tablet (1,000 mg total) by mouth at bedtime.    Dispense:  30 tablet    Refill:  0    Order Specific Question:  Supervising Provider    Answer:  Ernestina Penna [[id number]]  . zolpidem (AMBIEN) 10 MG tablet    Sig: Take 1 tablet (10 mg total) by mouth at bedtime as needed. For sleep    Dispense:  30 tablet    Refill:  2    Order Specific Question:  Supervising Provider    Answer:  Ernestina Penna [[id number]]  . loratadine (CLARITIN) 10 MG tablet    Sig: Take 1 tablet (10 mg total) by mouth daily.    Dispense:  30 tablet    Refill:  5    Order Specific Question:  Supervising Provider    Answer:  Ernestina Penna [[id number]]  . ezetimibe (ZETIA) 10 MG tablet    Sig: Take 1 tablet (10 mg total) by mouth daily.    Dispense:  30 tablet    Refill:  5    Order Specific Question:  Supervising Provider    Answer:  Ernestina Penna [[id number]]  . sertraline (ZOLOFT) 100 MG tablet    Sig: Take 1 tablet (100 mg total) by mouth daily.    Dispense:  30 tablet    Refill:  5    Order  Specific Question:  Supervising Provider    Answer:  Ernestina Penna [[id number]]  . simvastatin (ZOCOR) 40 MG tablet    Sig: Take 1 tablet (40 mg total) by mouth at bedtime.    Dispense:  30 tablet    Refill:  5    Order Specific Question:  Supervising Provider    Answer:  Ernestina Penna [[id number]]  . esomeprazole (NEXIUM) 40 MG capsule    Sig: Take 1 capsule (40 mg total) by mouth daily before breakfast.    Dispense:  30 capsule    Refill:  5    Order Specific Question:  Supervising Provider    Answer:  Ernestina Penna [[id number]]  . glucose blood (ONE TOUCH ULTRA TEST) test strip    Sig: Test 1 x per day and prn-- dx 250.01    Dispense:  100 each    Refill:  12    Order Specific Question:  Supervising Provider    Answer:  Ernestina Penna [[id number]]   Continue all meds Labs pending Stricter carb counting while watching fats as well Exercise encouraged Follow up in 3 months Health maintenance reviewed  Mary-Margaret Daphine Deutscher, FNP

## 2013-07-27 LAB — NMR, LIPOPROFILE
HDL Cholesterol by NMR: 49 mg/dL (ref >=40)
LDL Particle Number: 927 nmol/L (ref <1000)
LDLC SERPL CALC-MCNC: 33 mg/dL (ref <100)
Triglycerides by NMR: 198 mg/dL — ABNORMAL HIGH (ref <150)

## 2013-07-27 LAB — CMP14+EGFR
ALT: 35 IU/L (ref 0–44)
Albumin/Globulin Ratio: 2.3 (ref 1.1–2.5)
CO2: 26 mmol/L (ref 18–29)
Calcium: 9.2 mg/dL (ref 8.7–10.2)
Chloride: 101 mmol/L (ref 97–108)
GFR calc non Af Amer: 92 mL/min/{1.73_m2} (ref >59)
Glucose: 116 mg/dL — ABNORMAL HIGH (ref 65–99)
Potassium: 5 mmol/L (ref 3.5–5.2)
Total Protein: 6.9 g/dL (ref 6.0–8.5)

## 2013-09-16 ENCOUNTER — Other Ambulatory Visit: Payer: Self-pay | Admitting: Physician Assistant

## 2013-09-16 ENCOUNTER — Encounter: Payer: Self-pay | Admitting: Physician Assistant

## 2013-09-16 DIAGNOSIS — E119 Type 2 diabetes mellitus without complications: Secondary | ICD-10-CM | POA: Insufficient documentation

## 2013-09-16 DIAGNOSIS — G8929 Other chronic pain: Secondary | ICD-10-CM | POA: Insufficient documentation

## 2013-09-16 DIAGNOSIS — I10 Essential (primary) hypertension: Secondary | ICD-10-CM

## 2013-09-16 DIAGNOSIS — K219 Gastro-esophageal reflux disease without esophagitis: Secondary | ICD-10-CM | POA: Insufficient documentation

## 2013-09-16 DIAGNOSIS — M7542 Impingement syndrome of left shoulder: Secondary | ICD-10-CM

## 2013-09-16 DIAGNOSIS — S83241A Other tear of medial meniscus, current injury, right knee, initial encounter: Secondary | ICD-10-CM

## 2013-09-16 DIAGNOSIS — M75102 Unspecified rotator cuff tear or rupture of left shoulder, not specified as traumatic: Secondary | ICD-10-CM

## 2013-09-16 NOTE — H&P (Signed)
[name] is an 52 y.o. male.   Chief Complaint: right knee and left shoulder pain HPI: [name] is a 52 year-old male who is seen today in the clinic as a new patient with two complaints.  He has a complaint of right knee pain, as well as left shoulder pain.  His right knee pain he reports onset three weeks ago of right medial knee pain with no radiation.  His pain originated with a twisting motion.  He was getting out of the car and noted pain on the inside of the knee.  At that time he had swelling.  The swelling has improved somewhat with rest.  He has been off work for the past week for vacation and his symptoms have improved somewhat.  He does note a recurrence of symptoms with twisting or crouching or squatting motions.  Pain is less severe or less noticeable with general walking.  He has not taken any medications specifically for the knee.  However, he is on Oxycodone and Hydrocodone for chronic back pain. His left shoulder complaint is pain worse with range of motion of the shoulder, most notably reaching overhead and any activity that involves lifting overhead.  He reports a shoulder injury eight years ago while on a fishing trip that he never sought medical evaluation for at that time.  Since that time he has had several recurrences of left shoulder pain.  Over the last several months the left shoulder pain has progressively worsened.  He has difficulty reaching over his head with activities such as brushing his hair.  He also notes pain when reaching behind his back.  He feels that his range of motion is significantly limited and this affects his activities of daily living.    Past Medical History  Diagnosis Date  . Hypertension     takes Benicar daily  . Hyperlipidemia     takes Zetia daily  . Pneumonia 1986    walking  . Headache(784.0)     occasionally  . Dizziness   . Arthritis   . Joint pain   . Chronic back pain     stenosis  . Constipation     with pain meds  . GERD  (gastroesophageal reflux disease)     takes Nexium daily  . Urinary frequency     takes Flomax daily  . Urinary urgency   . Diabetes mellitus without complication     takes Metformni daily  . Depression     takes Zoloft daily  . Insomnia     takes Ambien nightly  . Seasonal allergies     takes Claritin daily  . Tear of medial meniscus of right knee   . Impingement syndrome of left shoulder   . Left rotator cuff tear     Past Surgical History  Procedure Laterality Date  . Nasal septum surgery    . Lithotripsy    . Removal of kidney stones    . Left elbow surgery    . Colonoscopy    . Back surgery  [id number]    Family History  Problem Relation Age of Onset  . COPD Mother   . Hypertension Father   . Alzheimer's disease Father   . Diabetes Maternal Grandmother    Social History:  reports that he has never smoked. He does not have any smokeless tobacco history on file. He reports that  drinks alcohol. He reports that he does not use illicit drugs.  Allergies:  Allergies  Allergen   Reactions  . Codeine   . Lipitor [Atorvastatin]     Myalgia   Current Outpatient Prescriptions on File Prior to Visit  Medication Sig Dispense Refill  . diazepam (VALIUM) 5 MG tablet Take 1-2 tablets (5-10 mg total) by mouth every 6 (six) hours as needed.  60 tablet  1  . esomeprazole (NEXIUM) 40 MG capsule Take 1 capsule (40 mg total) by mouth daily before breakfast.  30 capsule  5  . ezetimibe (ZETIA) 10 MG tablet Take 1 tablet (10 mg total) by mouth daily.  30 tablet  5  . fish oil-omega-3 fatty acids 1000 MG capsule Take 1 g by mouth 2 (two) times daily.      . fluticasone (FLONASE) 50 MCG/ACT nasal spray Place 1 spray into the nose daily.  16 g  5  . glucose blood (ONE TOUCH ULTRA TEST) test strip Test 1 x per day and prn-- dx 250.01  100 each  12  . loratadine (CLARITIN) 10 MG tablet Take 1 tablet (10 mg total) by mouth daily.  30 tablet  5  . meloxicam (MOBIC) 15 MG tablet       .  metFORMIN (GLUCOPHAGE) 1000 MG tablet Take 1 tablet (1,000 mg total) by mouth at bedtime.  30 tablet  0  . Multiple Vitamin (MULTIVITAMIN WITH MINERALS) TABS Take 1 tablet by mouth daily.      . olmesartan-hydrochlorothiazide (BENICAR HCT) 40-25 MG per tablet Take 1 tablet by mouth daily.  30 tablet  5  . olopatadine (PATANOL) 0.1 % ophthalmic solution Place 1 drop into both eyes 2 (two) times daily.  5 mL  5  . sertraline (ZOLOFT) 100 MG tablet Take 1 tablet (100 mg total) by mouth daily.  30 tablet  5  . simvastatin (ZOCOR) 40 MG tablet Take 1 tablet (40 mg total) by mouth at bedtime.  30 tablet  5  . tamsulosin (FLOMAX) 0.4 MG CAPS Take 1 capsule (0.4 mg total) by mouth daily.  30 capsule  2  . zolpidem (AMBIEN) 10 MG tablet Take 1 tablet (10 mg total) by mouth at bedtime as needed. For sleep  30 tablet  2   No current facility-administered medications on file prior to visit.    (Not in a hospital admission)  No results found for this or any previous visit (from the past 48 hour(s)). No results found.  Review of Systems  Constitutional: Negative.   HENT: Negative.   Eyes: Negative.   Respiratory: Negative.   Cardiovascular: Negative.   Gastrointestinal: Negative.   Genitourinary: Negative.   Musculoskeletal: Positive for joint pain.       Right knee and left shouder pain  Skin: Negative.   Neurological: Negative.   Endo/Heme/Allergies: Negative.   Psychiatric/Behavioral: Negative.     Blood pressure 127/86, pulse 87, height 5' 10" (1.778 m), weight 104.327 kg (230 lb). Physical Exam  Constitutional: He is oriented to person, place, and time. He appears well-developed and well-nourished.  HENT:  Head: Normocephalic and atraumatic.  Eyes: Conjunctivae and EOM are normal. Pupils are equal, round, and reactive to light.  Neck: Neck supple.  Cardiovascular: Normal rate.   Respiratory: Effort normal.  GI: Soft.  Genitourinary:  Not pertinent to current symptomatology  therefore not examined.  Musculoskeletal:   Examination of his right knee reveal pain on the medial joint line positive medial McMurray's 1+ effusion, full range of motion knee is stable with normal patella tracking. Exam of the left knee reveals full range   of motion without pain swelling weakness or instability. Vascular exam: pulses 2+ and symmetric. Exam of the left shoulder reveals forward flexion of 170 with pain abduction of 160 with pain and weakness, internal and external rotation of 80 degrees with pain and weakness no instability. Exam of the right shoulder reveals full range of motion without pain weakness or instability. Vascular exam: pulses 2+ and symmetric.  Neurological: He is alert and oriented to person, place, and time.  Skin: Skin is warm and dry.  Psychiatric: He has a normal mood and affect.     Assessment Patient Active Problem List   Diagnosis Date Noted  . Tear of medial meniscus of right knee   . Impingement syndrome of left shoulder   . Left rotator cuff tear   . Diabetes mellitus without complication   . GERD (gastroesophageal reflux disease)   . Chronic back pain   . Hypertension   . Allergic rhinitis 07/25/2013  . Insomnia 07/25/2013  . Essential hypertension, benign 03/16/2013  . Type II or unspecified type diabetes mellitus without mention of complication, not stated as uncontrolled 03/16/2013  . Other and unspecified hyperlipidemia 03/16/2013  . Depression 03/16/2013  . 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 Helder concerning his right knee MRI that has revealed a medial meniscus tear.  He also continues to have significant pain in his left shoulder with MRI from 07/20/2013 showing rotator cuff tendonitis with impingement.  We had injected his left shoulder on his last visit which helped minimally.  I told him with both of these findings I recommended that we proceed with right knee arthroscopy with attention to his  meniscal and chondral pathology and left shoulder arthroscopy with attention to his rotator cuff and biceps and impingement pathology.  Risks, complications and benefits of the surgeries have been described to him in detail and he understands this completely. We will plan on setting him up for this when he is ready to proceed.    Oval Cavazos J 09/16/2013, 12:38 PM    

## 2013-09-24 ENCOUNTER — Encounter (HOSPITAL_BASED_OUTPATIENT_CLINIC_OR_DEPARTMENT_OTHER): Payer: Self-pay | Admitting: *Deleted

## 2013-09-24 ENCOUNTER — Encounter (HOSPITAL_BASED_OUTPATIENT_CLINIC_OR_DEPARTMENT_OTHER)
Admission: RE | Admit: 2013-09-24 | Discharge: 2013-09-24 | Disposition: A | Discharge status: Home / Self Care | Payer: 59 | Source: Ambulatory Visit | Attending: Orthopedic Surgery | Admitting: Orthopedic Surgery

## 2013-09-24 LAB — BASIC METABOLIC PANEL
BUN: 16 mg/dL (ref 6–23)
Chloride: 99 mEq/L (ref 96–112)
Creatinine, Ser: 0.73 mg/dL (ref 0.50–1.35)
GFR calc Af Amer: >90 mL/min (ref >90)
GFR calc non Af Amer: >90 mL/min (ref >90)
Potassium: 4.3 mEq/L (ref 3.5–5.1)
Sodium: 137 mEq/L (ref 135–145)

## 2013-09-24 NOTE — Progress Notes (Signed)
Came in for preop-bmet done-to bring all meds and overnight bag just in case he needs to stay-having shoulder and knee done

## 2013-09-26 ENCOUNTER — Ambulatory Visit (HOSPITAL_BASED_OUTPATIENT_CLINIC_OR_DEPARTMENT_OTHER): Payer: 59 | Admitting: Anesthesiology

## 2013-09-26 ENCOUNTER — Encounter (HOSPITAL_BASED_OUTPATIENT_CLINIC_OR_DEPARTMENT_OTHER): Payer: Self-pay | Admitting: Anesthesiology

## 2013-09-26 ENCOUNTER — Encounter (HOSPITAL_BASED_OUTPATIENT_CLINIC_OR_DEPARTMENT_OTHER): Payer: 59 | Admitting: Anesthesiology

## 2013-09-26 ENCOUNTER — Ambulatory Visit (HOSPITAL_BASED_OUTPATIENT_CLINIC_OR_DEPARTMENT_OTHER)
Admission: RE | Admit: 2013-09-26 | Discharge: 2013-09-26 | Disposition: A | Discharge status: Home / Self Care | Payer: 59 | Source: Ambulatory Visit | Attending: Orthopedic Surgery | Admitting: Orthopedic Surgery

## 2013-09-26 ENCOUNTER — Encounter (HOSPITAL_BASED_OUTPATIENT_CLINIC_OR_DEPARTMENT_OTHER)
Admission: RE | Disposition: A | Discharge status: Home / Self Care | Payer: Self-pay | Source: Ambulatory Visit | Attending: Orthopedic Surgery

## 2013-09-26 DIAGNOSIS — Z79899 Other long term (current) drug therapy: Secondary | ICD-10-CM | POA: Insufficient documentation

## 2013-09-26 DIAGNOSIS — S43429A Sprain of unspecified rotator cuff capsule, initial encounter: Secondary | ICD-10-CM | POA: Insufficient documentation

## 2013-09-26 DIAGNOSIS — Z01812 Encounter for preprocedural laboratory examination: Secondary | ICD-10-CM | POA: Insufficient documentation

## 2013-09-26 DIAGNOSIS — M6750 Plica syndrome, unspecified knee: Secondary | ICD-10-CM | POA: Insufficient documentation

## 2013-09-26 DIAGNOSIS — K219 Gastro-esophageal reflux disease without esophagitis: Secondary | ICD-10-CM | POA: Diagnosis present

## 2013-09-26 DIAGNOSIS — S83241A Other tear of medial meniscus, current injury, right knee, initial encounter: Secondary | ICD-10-CM

## 2013-09-26 DIAGNOSIS — X500XXA Overexertion from strenuous movement or load, initial encounter: Secondary | ICD-10-CM | POA: Insufficient documentation

## 2013-09-26 DIAGNOSIS — I10 Essential (primary) hypertension: Secondary | ICD-10-CM | POA: Diagnosis present

## 2013-09-26 DIAGNOSIS — E119 Type 2 diabetes mellitus without complications: Secondary | ICD-10-CM | POA: Diagnosis present

## 2013-09-26 DIAGNOSIS — M25819 Other specified joint disorders, unspecified shoulder: Secondary | ICD-10-CM | POA: Insufficient documentation

## 2013-09-26 DIAGNOSIS — K5900 Constipation, unspecified: Secondary | ICD-10-CM | POA: Insufficient documentation

## 2013-09-26 DIAGNOSIS — F32A Depression, unspecified: Secondary | ICD-10-CM | POA: Diagnosis present

## 2013-09-26 DIAGNOSIS — M549 Dorsalgia, unspecified: Secondary | ICD-10-CM | POA: Insufficient documentation

## 2013-09-26 DIAGNOSIS — F329 Major depressive disorder, single episode, unspecified: Secondary | ICD-10-CM | POA: Insufficient documentation

## 2013-09-26 DIAGNOSIS — G4700 Insomnia, unspecified: Secondary | ICD-10-CM | POA: Insufficient documentation

## 2013-09-26 DIAGNOSIS — M48062 Spinal stenosis, lumbar region with neurogenic claudication: Secondary | ICD-10-CM | POA: Diagnosis present

## 2013-09-26 DIAGNOSIS — M7542 Impingement syndrome of left shoulder: Secondary | ICD-10-CM | POA: Diagnosis present

## 2013-09-26 DIAGNOSIS — G8929 Other chronic pain: Secondary | ICD-10-CM | POA: Insufficient documentation

## 2013-09-26 DIAGNOSIS — IMO0002 Reserved for concepts with insufficient information to code with codable children: Secondary | ICD-10-CM | POA: Insufficient documentation

## 2013-09-26 DIAGNOSIS — M129 Arthropathy, unspecified: Secondary | ICD-10-CM | POA: Insufficient documentation

## 2013-09-26 DIAGNOSIS — F3289 Other specified depressive episodes: Secondary | ICD-10-CM | POA: Insufficient documentation

## 2013-09-26 DIAGNOSIS — E785 Hyperlipidemia, unspecified: Secondary | ICD-10-CM | POA: Insufficient documentation

## 2013-09-26 HISTORY — PX: SHOULDER ARTHROSCOPY WITH ROTATOR CUFF REPAIR AND SUBACROMIAL DECOMPRESSION: SHX5686

## 2013-09-26 HISTORY — PX: KNEE ARTHROSCOPY WITH MEDIAL MENISECTOMY: SHX5651

## 2013-09-26 SURGERY — ARTHROSCOPY, KNEE, WITH MEDIAL MENISCECTOMY
Anesthesia: General | Site: Shoulder | Laterality: Right | Wound class: Clean

## 2013-09-26 MED ORDER — CEFAZOLIN SODIUM 10 G IJ SOLR
3.0000 g | INTRAMUSCULAR | Status: AC
  Administered 2013-09-26: 3 g via INTRAVENOUS

## 2013-09-26 MED ORDER — MIDAZOLAM HCL 2 MG/2ML IJ SOLN
INTRAMUSCULAR | Status: AC
  Filled 2013-09-26: qty 2

## 2013-09-26 MED ORDER — OXYCODONE-ACETAMINOPHEN 10-325 MG PO TABS: ORAL_TABLET | ORAL | Status: DC

## 2013-09-26 MED ORDER — EPINEPHRINE HCL 1 MG/ML IJ SOLN
INTRAMUSCULAR | Status: DC | PRN
  Administered 2013-09-26: 1 mg

## 2013-09-26 MED ORDER — EPINEPHRINE HCL 1 MG/ML IJ SOLN
INTRAMUSCULAR | Status: AC
  Filled 2013-09-26: qty 1

## 2013-09-26 MED ORDER — FENTANYL CITRATE 0.05 MG/ML IJ SOLN
INTRAMUSCULAR | Status: AC
  Filled 2013-09-26: qty 2

## 2013-09-26 MED ORDER — ROPIVACAINE HCL 5 MG/ML IJ SOLN
INTRAMUSCULAR | Status: DC | PRN
  Administered 2013-09-26: 20 mL

## 2013-09-26 MED ORDER — CEFAZOLIN SODIUM-DEXTROSE 2-3 GM-% IV SOLR
INTRAVENOUS | Status: AC
  Filled 2013-09-26: qty 50

## 2013-09-26 MED ORDER — FENTANYL CITRATE 0.05 MG/ML IJ SOLN
100.0000 ug | Freq: Once | INTRAMUSCULAR | Status: AC
  Administered 2013-09-26: 100 ug via INTRAVENOUS

## 2013-09-26 MED ORDER — BUPIVACAINE-EPINEPHRINE PF 0.25-1:200000 % IJ SOLN
INTRAMUSCULAR | Status: AC
  Filled 2013-09-26: qty 30

## 2013-09-26 MED ORDER — LACTATED RINGERS IV SOLN
INTRAVENOUS | Status: DC
  Administered 2013-09-26: 10:00:00 via INTRAVENOUS

## 2013-09-26 MED ORDER — EPHEDRINE SULFATE 50 MG/ML IJ SOLN
INTRAMUSCULAR | Status: DC | PRN
  Administered 2013-09-26 (×2): 10 mg via INTRAVENOUS

## 2013-09-26 MED ORDER — METOCLOPRAMIDE HCL 5 MG/ML IJ SOLN: 10.0000 mg | Freq: Once | INTRAMUSCULAR | Status: DC | PRN

## 2013-09-26 MED ORDER — FENTANYL CITRATE 0.05 MG/ML IJ SOLN
INTRAMUSCULAR | Status: AC
  Filled 2013-09-26: qty 6

## 2013-09-26 MED ORDER — OXYCODONE HCL 5 MG PO TABS
5.0000 mg | ORAL_TABLET | Freq: Once | ORAL | Status: AC | PRN
  Administered 2013-09-26: 5 mg via ORAL

## 2013-09-26 MED ORDER — LIDOCAINE HCL (CARDIAC) 20 MG/ML IV SOLN
INTRAVENOUS | Status: DC | PRN
  Administered 2013-09-26: 30 mg via INTRAVENOUS

## 2013-09-26 MED ORDER — LIDOCAINE-EPINEPHRINE 1 %-1:100000 IJ SOLN
INTRAMUSCULAR | Status: DC | PRN
  Administered 2013-09-26: 15 mL

## 2013-09-26 MED ORDER — SUCCINYLCHOLINE CHLORIDE 20 MG/ML IJ SOLN
INTRAMUSCULAR | Status: DC | PRN
  Administered 2013-09-26: 100 mg via INTRAVENOUS

## 2013-09-26 MED ORDER — OXYCODONE HCL 5 MG/5ML PO SOLN: 5.0000 mg | Freq: Once | ORAL | Status: AC | PRN

## 2013-09-26 MED ORDER — SODIUM CHLORIDE 0.9 % IR SOLN
Status: DC | PRN
  Administered 2013-09-26: 6000 mL

## 2013-09-26 MED ORDER — MIDAZOLAM HCL 5 MG/ML IJ SOLN
0.5000 mg | Freq: Once | INTRAMUSCULAR | Status: AC
  Administered 2013-09-26: 2 mg via INTRAVENOUS

## 2013-09-26 MED ORDER — FENTANYL CITRATE 0.05 MG/ML IJ SOLN
INTRAMUSCULAR | Status: DC | PRN
  Administered 2013-09-26: 100 ug via INTRAVENOUS
  Administered 2013-09-26 (×2): 50 ug via INTRAVENOUS

## 2013-09-26 MED ORDER — PROPOFOL 10 MG/ML IV BOLUS
INTRAVENOUS | Status: DC | PRN
  Administered 2013-09-26: 250 mg via INTRAVENOUS
  Administered 2013-09-26: 30 mg via INTRAVENOUS

## 2013-09-26 MED ORDER — BUPIVACAINE-EPINEPHRINE 0.25% -1:200000 IJ SOLN
INTRAMUSCULAR | Status: DC | PRN
  Administered 2013-09-26: 20 mL

## 2013-09-26 MED ORDER — CHLORHEXIDINE GLUCONATE 4 % EX LIQD: 60.0000 mL | Freq: Once | CUTANEOUS | Status: DC

## 2013-09-26 MED ORDER — OXYCODONE HCL 5 MG PO TABS
ORAL_TABLET | ORAL | Status: AC
  Filled 2013-09-26: qty 1

## 2013-09-26 MED ORDER — ONDANSETRON HCL 4 MG/2ML IJ SOLN
INTRAMUSCULAR | Status: DC | PRN
  Administered 2013-09-26: 4 mg via INTRAMUSCULAR

## 2013-09-26 MED ORDER — LACTATED RINGERS IV SOLN: INTRAVENOUS | Status: DC

## 2013-09-26 MED ORDER — DEXAMETHASONE SODIUM PHOSPHATE 4 MG/ML IJ SOLN
INTRAMUSCULAR | Status: DC | PRN
  Administered 2013-09-26 (×2): 5 mg via INTRAVENOUS

## 2013-09-26 MED ORDER — LIDOCAINE HCL 1 % IJ SOLN
INTRAMUSCULAR | Status: DC | PRN
  Administered 2013-09-26: 2 mL via INTRADERMAL

## 2013-09-26 MED ORDER — HYDROMORPHONE HCL PF 1 MG/ML IJ SOLN: 0.2500 mg | INTRAMUSCULAR | Status: DC | PRN

## 2013-09-26 MED ORDER — CEFAZOLIN SODIUM 1-5 GM-% IV SOLN
INTRAVENOUS | Status: AC
  Filled 2013-09-26: qty 50

## 2013-09-26 MED ORDER — PROPOFOL 10 MG/ML IV EMUL
INTRAVENOUS | Status: AC
  Filled 2013-09-26: qty 50

## 2013-09-26 SURGICAL SUPPLY — 87 items
APL SKNCLS STERI-STRIP NONHPOA (GAUZE/BANDAGES/DRESSINGS)
BANDAGE ELASTIC 6 VELCRO ST LF (GAUZE/BANDAGES/DRESSINGS) ×3 IMPLANT
BENZOIN TINCTURE PRP APPL 2/3 (GAUZE/BANDAGES/DRESSINGS) IMPLANT
BLADE CUDA 5.5 (BLADE) IMPLANT
BLADE CUTTER GATOR 3.5 (BLADE) ×3 IMPLANT
BLADE GREAT WHITE 4.2 (BLADE) ×1 IMPLANT
BLADE SURG 15 STRL LF DISP TIS (BLADE) IMPLANT
BLADE SURG 15 STRL SS (BLADE)
BLADE SURG ROTATE 9660 (MISCELLANEOUS) IMPLANT
BNDG COHESIVE 4X5 TAN STRL (GAUZE/BANDAGES/DRESSINGS) ×4 IMPLANT
BUR OVAL 6.0 (BURR) ×3 IMPLANT
CANISTER OMNI JUG 16 LITER (MISCELLANEOUS) ×3 IMPLANT
CANISTER SUCT 3000ML (MISCELLANEOUS) IMPLANT
CANNULA TWIST IN 8.25X7CM (CANNULA) IMPLANT
DECANTER SPIKE VIAL GLASS SM (MISCELLANEOUS) IMPLANT
DRAPE ARTHROSCOPY W/POUCH 90 (DRAPES) ×3 IMPLANT
DRAPE SHOULDER BEACH CHAIR (DRAPES) ×3 IMPLANT
DRAPE U-SHAPE 47X51 STRL (DRAPES) ×6 IMPLANT
DRSG PAD ABDOMINAL 8X10 ST (GAUZE/BANDAGES/DRESSINGS) ×3 IMPLANT
DURAPREP 26ML APPLICATOR (WOUND CARE) ×4 IMPLANT
ELECT REM PT RETURN 9FT ADLT (ELECTROSURGICAL) ×3
ELECTRODE REM PT RTRN 9FT ADLT (ELECTROSURGICAL) ×2 IMPLANT
GAUZE XEROFORM 1X8 LF (GAUZE/BANDAGES/DRESSINGS) ×3 IMPLANT
GLOVE BIO SURGEON STRL SZ7 (GLOVE) ×3 IMPLANT
GLOVE BIO SURGEON STRL SZ8 (GLOVE) ×2 IMPLANT
GLOVE BIOGEL PI IND STRL 7.0 (GLOVE) ×2 IMPLANT
GLOVE BIOGEL PI IND STRL 7.5 (GLOVE) ×2 IMPLANT
GLOVE BIOGEL PI IND STRL 8 (GLOVE) IMPLANT
GLOVE BIOGEL PI INDICATOR 7.0 (GLOVE) ×2
GLOVE BIOGEL PI INDICATOR 7.5 (GLOVE) ×1
GLOVE BIOGEL PI INDICATOR 8 (GLOVE) ×2
GLOVE ECLIPSE 6.5 STRL STRAW (GLOVE) ×1 IMPLANT
GLOVE SS BIOGEL STRL SZ 7.5 (GLOVE) ×2 IMPLANT
GLOVE SUPERSENSE BIOGEL SZ 7.5 (GLOVE) ×1
GOWN PREVENTION PLUS XLARGE (GOWN DISPOSABLE) ×6 IMPLANT
HOLDER KNEE FOAM BLUE (MISCELLANEOUS) ×3 IMPLANT
KNEE WRAP E Z 3 GEL PACK (MISCELLANEOUS) ×3 IMPLANT
LOOP 2 FIBERLINK CLOSED (SUTURE) IMPLANT
NDL 1/2 CIR CATGUT .05X1.09 (NEEDLE) IMPLANT
NDL SAFETY ECLIPSE 18X1.5 (NEEDLE) ×4 IMPLANT
NDL SCORPION MULTI FIRE (NEEDLE) IMPLANT
NDL SUT 6 .5 CRC .975X.05 MAYO (NEEDLE) IMPLANT
NEEDLE 1/2 CIR CATGUT .05X1.09 (NEEDLE) IMPLANT
NEEDLE HYPO 18GX1.5 SHARP (NEEDLE) ×6
NEEDLE HYPO 22GX1.5 SAFETY (NEEDLE) IMPLANT
NEEDLE MAYO TAPER (NEEDLE)
NEEDLE SCORPION MULTI FIRE (NEEDLE) IMPLANT
PACK ARTHROSCOPY DSU (CUSTOM PROCEDURE TRAY) ×3 IMPLANT
PACK BASIN DAY SURGERY FS (CUSTOM PROCEDURE TRAY) ×3 IMPLANT
PAD ALCOHOL SWAB (MISCELLANEOUS) ×8 IMPLANT
PENCIL BUTTON HOLSTER BLD 10FT (ELECTRODE) IMPLANT
SET ARTHROSCOPY TUBING (MISCELLANEOUS) ×3
SET ARTHROSCOPY TUBING LN (MISCELLANEOUS) ×2 IMPLANT
SHEET MEDIUM DRAPE 40X70 STRL (DRAPES) IMPLANT
SLEEVE SCD COMPRESS KNEE MED (MISCELLANEOUS) ×1 IMPLANT
SLING ARM FOAM STRAP LRG (SOFTGOODS) ×1 IMPLANT
SLING ARM FOAM STRAP MED (SOFTGOODS) IMPLANT
SLING ARM FOAM STRAP XLG (SOFTGOODS) IMPLANT
SLING ARM IMMOBILIZER MED (SOFTGOODS) IMPLANT
SLING ULTRA III MED (ORTHOPEDIC SUPPLIES) IMPLANT
SPONGE GAUZE 4X4 12PLY (GAUZE/BANDAGES/DRESSINGS) ×3 IMPLANT
SPONGE LAP 4X18 X RAY DECT (DISPOSABLE) IMPLANT
STRIP CLOSURE SKIN 1/2X4 (GAUZE/BANDAGES/DRESSINGS) IMPLANT
SUCTION FRAZIER TIP 10 FR DISP (SUCTIONS) IMPLANT
SUT ETHILON 3 0 PS 1 (SUTURE) ×3 IMPLANT
SUT ETHILON 4 0 PS 2 18 (SUTURE) ×3 IMPLANT
SUT FIBERWIRE #2 38 T-5 BLUE (SUTURE)
SUT PDS AB 2-0 CT2 27 (SUTURE) IMPLANT
SUT PROLENE 3 0 PS 2 (SUTURE) IMPLANT
SUT TIGER TAPE 7 IN WHITE (SUTURE) IMPLANT
SUT VIC AB 0 SH 27 (SUTURE) IMPLANT
SUT VIC AB 2-0 PS2 27 (SUTURE) IMPLANT
SUT VIC AB 2-0 SH 27 (SUTURE)
SUT VIC AB 2-0 SH 27XBRD (SUTURE) IMPLANT
SUT VIC AB 3-0 PS1 18 (SUTURE)
SUT VIC AB 3-0 PS1 18XBRD (SUTURE) IMPLANT
SUTURE FIBERWR #2 38 T-5 BLUE (SUTURE) IMPLANT
SYR 20CC LL (SYRINGE) ×1 IMPLANT
SYR 5ML LL (SYRINGE) ×3 IMPLANT
SYR BULB 3OZ (MISCELLANEOUS) IMPLANT
TAPE FIBER 2MM 7IN #2 BLUE (SUTURE) IMPLANT
TAPE HYPAFIX 6X30 (GAUZE/BANDAGES/DRESSINGS) IMPLANT
TAPE STRIPS DRAPE STRL (GAUZE/BANDAGES/DRESSINGS) ×3 IMPLANT
TOWEL OR 17X24 6PK STRL BLUE (TOWEL DISPOSABLE) ×3 IMPLANT
TUBE CONNECTING 20X1/4 (TUBING) ×1 IMPLANT
WAND STAR VAC 90 (SURGICAL WAND) ×3 IMPLANT
WATER STERILE IRR 1000ML POUR (IV SOLUTION) ×3 IMPLANT

## 2013-09-26 NOTE — Progress Notes (Signed)
Assisted Dr. Frederick with left, ultrasound guided, interscalene  block. Side rails up, monitors on throughout procedure. See vital signs in flow sheet. Tolerated Procedure well. 

## 2013-09-26 NOTE — H&P (View-Only) (Signed)
[name] is an 52 y.o y.o. male.   Chief Complaint: right knee and left shoulder pain HPI: [name] is a 52 year-old male who is seen today in the clinic as a new patient with two complaints.  He has a complaint of right knee pain, as well as left shoulder pain.  His right knee pain he reports onset three weeks ago of right medial knee pain with no radiation.  His pain originated with a twisting motion.  He was getting out of the car and noted pain on the inside of the knee.  At that time he had swelling.  The swelling has improved somewhat with rest.  He has been off work for the past week for vacation and his symptoms have improved somewhat.  He does note a recurrence of symptoms with twisting or crouching or squatting motions.  Pain is less severe or less noticeable with general walking.  He has not taken any medications specifically for the knee.  However, he is on Oxycodone and Hydrocodone for chronic back pain. His left shoulder complaint is pain worse with range of motion of the shoulder, most notably reaching overhead and any activity that involves lifting overhead.  He reports a shoulder injury eight years ago while on a fishing trip that he never sought medical evaluation for at that time.  Since that time he has had several recurrences of left shoulder pain.  Over the last several months the left shoulder pain has progressively worsened.  He has difficulty reaching over his head with activities such as brushing his hair.  He also notes pain when reaching behind his back.  He feels that his range of motion is significantly limited and this affects his activities of daily living.    Past Medical History  Diagnosis Date  . Hypertension     takes Benicar daily  . Hyperlipidemia     takes Zetia daily  . Pneumonia 1986    walking  . Headache(784.0)     occasionally  . Dizziness   . Arthritis   . Joint pain   . Chronic back pain     stenosis  . Constipation     with pain meds  . GERD  (gastroesophageal reflux disease)     takes Nexium daily  . Urinary frequency     takes Flomax daily  . Urinary urgency   . Diabetes mellitus without complication     takes Metformni daily  . Depression     takes Zoloft daily  . Insomnia     takes Ambien nightly  . Seasonal allergies     takes Claritin daily  . Tear of medial meniscus of right knee   . Impingement syndrome of left shoulder   . Left rotator cuff tear     Past Surgical History  Procedure Laterality Date  . Nasal septum surgery    . Lithotripsy    . Removal of kidney stones    . Left elbow surgery    . Colonoscopy    . Back surgery  [id number]    Family History  Problem Relation Age of Onset  . COPD Mother   . Hypertension Father   . Alzheimer's disease Father   . Diabetes Maternal Grandmother    Social History:  reports that he has never smoked. He does not have any smokeless tobacco history on file. He reports that  drinks alcohol. He reports that he does not use illicit drugs.  Allergies:  Allergies  Allergen  Reactions  . Codeine   . Lipitor [Atorvastatin]     Myalgia   Current Outpatient Prescriptions on File Prior to Visit  Medication Sig Dispense Refill  . diazepam (VALIUM) 5 MG tablet Take 1-2 tablets (5-10 mg total) by mouth every 6 (six) hours as needed.  60 tablet  1  . esomeprazole (NEXIUM) 40 MG capsule Take 1 capsule (40 mg total) by mouth daily before breakfast.  30 capsule  5  . ezetimibe (ZETIA) 10 MG tablet Take 1 tablet (10 mg total) by mouth daily.  30 tablet  5  . fish oil-omega-3 fatty acids 1000 MG capsule Take 1 g by mouth 2 (two) times daily.      . fluticasone (FLONASE) 50 MCG/ACT nasal spray Place 1 spray into the nose daily.  16 g  5  . glucose blood (ONE TOUCH ULTRA TEST) test strip Test 1 x per day and prn-- dx 250.01  100 each  12  . loratadine (CLARITIN) 10 MG tablet Take 1 tablet (10 mg total) by mouth daily.  30 tablet  5  . meloxicam (MOBIC) 15 MG tablet       .  metFORMIN (GLUCOPHAGE) 1000 MG tablet Take 1 tablet (1,000 mg total) by mouth at bedtime.  30 tablet  0  . Multiple Vitamin (MULTIVITAMIN WITH MINERALS) TABS Take 1 tablet by mouth daily.      Marland Kitchen olmesartan-hydrochlorothiazide (BENICAR HCT) 40-25 MG per tablet Take 1 tablet by mouth daily.  30 tablet  5  . olopatadine (PATANOL) 0.1 % ophthalmic solution Place 1 drop into both eyes 2 (two) times daily.  5 mL  5  . sertraline (ZOLOFT) 100 MG tablet Take 1 tablet (100 mg total) by mouth daily.  30 tablet  5  . simvastatin (ZOCOR) 40 MG tablet Take 1 tablet (40 mg total) by mouth at bedtime.  30 tablet  5  . tamsulosin (FLOMAX) 0.4 MG CAPS Take 1 capsule (0.4 mg total) by mouth daily.  30 capsule  2  . zolpidem (AMBIEN) 10 MG tablet Take 1 tablet (10 mg total) by mouth at bedtime as needed. For sleep  30 tablet  2   No current facility-administered medications on file prior to visit.    (Not in a hospital admission)  No results found for this or any previous visit (from the past 48 hour(s)). No results found.  Review of Systems  Constitutional: Negative.   HENT: Negative.   Eyes: Negative.   Respiratory: Negative.   Cardiovascular: Negative.   Gastrointestinal: Negative.   Genitourinary: Negative.   Musculoskeletal: Positive for joint pain.       Right knee and left shouder pain  Skin: Negative.   Neurological: Negative.   Endo/Heme/Allergies: Negative.   Psychiatric/Behavioral: Negative.     Blood pressure 127/86, pulse 87, height 5\' 10"  (1.778 m), weight 104.327 kg (230 lb). Physical Exam  Constitutional: He is oriented to person, place, and time. He appears well-developed and well-nourished.  HENT:  Head: Normocephalic and atraumatic.  Eyes: Conjunctivae and EOM are normal. Pupils are equal, round, and reactive to light.  Neck: Neck supple.  Cardiovascular: Normal rate.   Respiratory: Effort normal.  GI: Soft.  Genitourinary:  Not pertinent to current symptomatology  therefore not examined.  Musculoskeletal:   Examination of his right knee reveal pain on the medial joint line positive medial McMurray's 1+ effusion, full range of motion knee is stable with normal patella tracking. Exam of the left knee reveals full range  of motion without pain swelling weakness or instability. Vascular exam: pulses 2+ and symmetric. Exam of the left shoulder reveals forward flexion of 170 with pain abduction of 160 with pain and weakness, internal and external rotation of 80 degrees with pain and weakness no instability. Exam of the right shoulder reveals full range of motion without pain weakness or instability. Vascular exam: pulses 2+ and symmetric.  Neurological: He is alert and oriented to person, place, and time.  Skin: Skin is warm and dry.  Psychiatric: He has a normal mood and affect.     Assessment Patient Active Problem List   Diagnosis Date Noted  . Tear of medial meniscus of right knee   . Impingement syndrome of left shoulder   . Left rotator cuff tear   . Diabetes mellitus without complication   . GERD (gastroesophageal reflux disease)   . Chronic back pain   . Hypertension   . Allergic rhinitis 07/25/2013  . Insomnia 07/25/2013  . Essential hypertension, benign 03/16/2013  . Type II or unspecified type diabetes mellitus without mention of complication, not stated as uncontrolled 03/16/2013  . Other and unspecified hyperlipidemia 03/16/2013  . Depression 03/16/2013  . 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 Stegall concerning his right knee MRI that has revealed a medial meniscus tear.  He also continues to have significant pain in his left shoulder with MRI from 07/20/2013 showing rotator cuff tendonitis with impingement.  We had injected his left shoulder on his last visit which helped minimally.  I told him with both of these findings I recommended that we proceed with right knee arthroscopy with attention to his  meniscal and chondral pathology and left shoulder arthroscopy with attention to his rotator cuff and biceps and impingement pathology.  Risks, complications and benefits of the surgeries have been described to him in detail and he understands this completely. We will plan on setting him up for this when he is ready to proceed.    Holbert Caples J 09/16/2013, 12:38 PM

## 2013-09-26 NOTE — Progress Notes (Signed)
Assisted Dr. Gelene Mink with right, knee block. Side rails up, monitors on throughout procedure. See vital signs in flow sheet. Tolerated Procedure well.

## 2013-09-26 NOTE — Anesthesia Procedure Notes (Addendum)
Anesthesia Regional Block:  Interscalene brachial plexus block  Pre-Anesthetic Checklist: ,, timeout performed, Correct Patient, Correct Site, Correct Laterality, Correct Procedure, Correct Position, site marked, Risks and benefits discussed,  Surgical consent,  Pre-op evaluation,  At surgeon's request and post-op pain management  Laterality: Left  Prep: chloraprep       Needles:   Needle Type: Other     Needle Length: 9cm  Needle Gauge: 21 and 21 G    Additional Needles:  Procedures: ultrasound guided (picture in chart) Interscalene brachial plexus block Narrative:  Start time: 09/26/2013 9:50 AM End time: 09/26/2013 9:56 AM Injection made incrementally with aspirations every 5 mL.  Performed by: Personally  Anesthesiologist: Aldona Lento, MD  Additional Notes: Ultrasound guidance used to: id relevant anatomy, confirm needle position, local anesthetic spread, avoidance of vascular puncture. Picture saved. No complications. Block performed personally by Janetta Hora. Gelene Mink, MD    Interscalene brachial plexus block Anesthesia Procedure Note I was consulted by the surgeon to provide a pre-operative intra-articular injection. Patient was identified and a time out was performed. Intravenous sedation performed by RN in attendance. The operative knee was prepped and draped in a sterile fashion using betadine spray. Using sterile technique an 18g. 1.5 inch needle was used to inject 15 cc of 1% lidocaine with 1:100,000 epinephrine into the operative knee. The patient tolerated the procedure well.  Procedure performed personally by Janetta Hora. Gelene Mink, MD  Time start: 1002 - 1006  Time end: Anesthesia Procedure Note I was consulted by the surgeon to provide a pre-operative intra-articular injection. Patient was identified and a time out was performed. Intravenous sedation performed by RN in attendance. The operative knee was prepped and draped in a sterile fashion using betadine  spray. Using sterile technique an 18g. 1.5 inch needle was used to inject 15 cc of 1% lidocaine with 1:100,000 epinephrine into the operative knee. The patient tolerated the procedure well.  Procedure performed personally by Janetta Hora. Gelene Mink, MD  Time start: 1002 - 1006  Time end: Anesthesia Procedure Note I was consulted by the surgeon to provide a pre-operative intra-articular injection. Patient was identified and a time out was performed. Intravenous sedation performed by RN in attendance. The operative knee was prepped and draped in a sterile fashion using betadine spray. Using sterile technique an 18g. 1.5 inch needle was used to inject 15 cc of 1% lidocaine with 1:100,000 epinephrine into the operative knee. The patient tolerated the procedure well.  Procedure performed personally by Janetta Hora. Gelene Mink, MD  Time start: 1002 - 1006  Time end: Anesthesia Procedure Note I was consulted by the surgeon to provide a pre-operative intra-articular injection. Patient was identified and a time out was performed. Intravenous sedation performed by RN in attendance. The operative knee was prepped and draped in a sterile fashion using betadine spray. Using sterile technique an 18g. 1.5 inch needle was used to inject 15 cc of 1% lidocaine with 1:100,000 epinephrine into the operative knee. The patient tolerated the procedure well.  Procedure performed personally by Janetta Hora. Gelene Mink, MD  Time start: 1002 - 1006  Time end: Procedure Name: Intubation Date/Time: 09/26/2013 10:41 AM Performed by: Gar Gibbon Pre-anesthesia Checklist: Patient identified, Emergency Drugs available, Suction available and Patient being monitored Patient Re-evaluated:Patient Re-evaluated prior to inductionOxygen Delivery Method: Circle System Utilized Preoxygenation: Pre-oxygenation with 100% oxygen Intubation Type: IV induction Ventilation: Mask ventilation without difficulty Grade View: Grade III Tube  type: Oral Number of attempts: 1 Airway Equipment and Method: stylet,  oral airway,  Patient positioned with wedge pillow and Video-laryngoscopy Placement Confirmation: positive ETCO2 and breath sounds checked- equal and bilateral Secured at: 22 cm Tube secured with: Tape Dental Injury: Teeth and Oropharynx as per pre-operative assessment

## 2013-09-26 NOTE — Transfer of Care (Signed)
Immediate Anesthesia Transfer of Care Note  Patient: [name]  Procedure(s) Performed: Procedure(s): RIGHT KNEE ARTHROSCOPY WITH PARTIAL MEDIAL and lateral chrondroplasty MENISECTOMY (Right) LEFT SHOULDER ARTHROSCOPY WITH DEBRIDEMENT, PARTIAL ROTATOR CUFF REPAIR AND SUBACROMIAL DECOMPRESSION AND SAD ACD DISTAL CLAVICULECTOMY (Left)  Patient Location: PACU  Anesthesia Type:GA combined with regional for post-op pain  Level of Consciousness: awake, sedated and patient cooperative  Airway & Oxygen Therapy: Patient Spontanous Breathing and Patient connected to face mask oxygen  Post-op Assessment: Report given to PACU RN and Post -op Vital signs reviewed and stable  Post vital signs: Reviewed and stable  Complications: No apparent anesthesia complications

## 2013-09-26 NOTE — Interval H&P Note (Signed)
History and Physical Interval Note:  09/26/2013 10:24 AM  [name]  has presented today for surgery, with the diagnosis of RIGHT KNEE DISLOCATION, MEDIAL MENISCAL TEAR, LEFT SHOULDER IMPINGEMENT SYNDROME  The various methods of treatment have been discussed with the patient and family. After consideration of risks, benefits and other options for treatment, the patient has consented to  Procedure(s): RIGHT KNEE ARTHROSCOPY WITH PARTIAL MEDIAL MENISECTOMY (Right) LEFT SHOULDER ARTHROSCOPY WITH DEBRIDEMENT, PARTIAL ROTATOR CUFF REPAIR AND SUBACROMIAL DECOMPRESSION AND DISTAL CLAVICULECTOMY (Left) as a surgical intervention .  The patient's history has been reviewed, patient examined, no change in status, stable for surgery.  I have reviewed the patient's chart and labs.  Questions were answered to the patient's satisfaction.     Salvatore Marvel A

## 2013-09-26 NOTE — Anesthesia Preprocedure Evaluation (Signed)
Anesthesia Evaluation  Patient identified by MRN, date of birth, ID band Patient awake    Reviewed: Allergy & Precautions, H&P , NPO status , Patient's Chart, lab work & pertinent test results, reviewed documented beta blocker date and time   Airway Mallampati: II TM Distance: >3 FB Neck ROM: full    Dental   Pulmonary pneumonia -, resolved,  breath sounds clear to auscultation        Cardiovascular hypertension, On Medications negative cardio ROS  Rhythm:regular     Neuro/Psych  Headaches, PSYCHIATRIC DISORDERS  Neuromuscular disease    GI/Hepatic Neg liver ROS, GERD-  Medicated and Controlled,  Endo/Other  diabetes, Oral Hypoglycemic Agents  Renal/GU negative Renal ROS  negative genitourinary   Musculoskeletal   Abdominal   Peds  Hematology negative hematology ROS (+)   Anesthesia Other Findings See surgeon's H&P   Reproductive/Obstetrics negative OB ROS                           Anesthesia Physical Anesthesia Plan  ASA: III  Anesthesia Plan: General   Post-op Pain Management:    Induction: Intravenous  Airway Management Planned: Oral ETT  Additional Equipment:   Intra-op Plan:   Post-operative Plan: Extubation in OR  Informed Consent: I have reviewed the patients History and Physical, chart, labs and discussed the procedure including the risks, benefits and alternatives for the proposed anesthesia with the patient or authorized representative who has indicated his/her understanding and acceptance.   Dental Advisory Given  Plan Discussed with: CRNA and Surgeon  Anesthesia Plan Comments:         Anesthesia Quick Evaluation

## 2013-09-26 NOTE — Anesthesia Postprocedure Evaluation (Signed)
Anesthesia Post Note  Patient: [name]  Procedure(s) Performed: Procedure(s) (LRB): RIGHT KNEE ARTHROSCOPY WITH PARTIAL MEDIAL and lateral chrondroplasty MENISECTOMY (Right) LEFT SHOULDER ARTHROSCOPY WITH DEBRIDEMENT, PARTIAL ROTATOR CUFF REPAIR AND SUBACROMIAL DECOMPRESSION AND SAD ACD DISTAL CLAVICULECTOMY (Left)  Anesthesia type: General  Patient location: PACU  Post pain: Pain level controlled  Post assessment: Patient's Cardiovascular Status Stable  Last Vitals:  Filed Vitals:   09/26/13 1245  BP: 106/67  Pulse: 102  Temp:   Resp: 20    Post vital signs: Reviewed and stable  Level of consciousness: alert  Complications: No apparent anesthesia complications

## 2013-09-27 ENCOUNTER — Other Ambulatory Visit: Payer: Self-pay | Admitting: *Deleted

## 2013-09-27 DIAGNOSIS — E119 Type 2 diabetes mellitus without complications: Secondary | ICD-10-CM

## 2013-09-27 MED ORDER — METFORMIN HCL 1000 MG PO TABS: 1000.0000 mg | ORAL_TABLET | Freq: Every day | ORAL | Status: DC

## 2013-09-27 NOTE — Op Note (Signed)
[name], [name]                 ACCOUNT NO.:  [id number]  MEDICAL RECORD NO.:  [id number]  LOCATION:                               FACILITY:  [facility]  PHYSICIAN:  Kateline Kinkade A. Thurston Ross, M.D. DATE OF BIRTH:  08-02-1961  DATE OF PROCEDURE:  09/26/2013 DATE OF DISCHARGE:  09/26/2013                              OPERATIVE REPORT   PREOPERATIVE DIAGNOSES: 1. Left shoulder partial rotator cuff tear, partial labrum tear,     partial biceps tendon tear. 2. Left shoulder impingement. 3. 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 4. Right knee medial and lateral meniscal tears with chondromalacia. 5. Right knee plica with synovitis.  POSTOPERATIVE DIAGNOSES: 1. Left shoulder partial rotator cuff tear, partial labrum tear,     partial biceps tendon tear. 2. Left shoulder impingement. 3. 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 4. Right knee medial and lateral meniscal tears with chondromalacia. 5. Right knee plica with synovitis.  PROCEDURE: 1. Left shoulder EUA followed by arthroscopic debridement, partial     rotator cuff, partial labrum, and partial biceps tendon tear. 2. Left shoulder subacromial decompression. 3. Left shoulder distal clavicle excision. 4. Right knee EUA followed by arthroscopic partial medial and lateral     meniscectomies with chondroplasty. 5. Right knee medial plica excision with partial synovectomy.  SURGEON:  Elana Alm. Thurston Hole, MD  ANESTHESIA:  General.  OPERATIVE TIME:  One hour.  COMPLICATIONS:  None.  INDICATION FOR PROCEDURE:  [name] is a 52 year old gentleman, who has had significant left shoulder pain and right knee pain for the past 6-9 months increasing in nature with exam and MRI documenting left shoulder partial rotator cuff and partial labrum tear debridement with impingement and AC joint arthropathy and right knee MRI documenting medial and lateral meniscal  tears with chondromalacia and plica and synovitis.  He has failed multiple conservative modalities and he is now to undergo arthroscopy of the left shoulder and the right knee.  DESCRIPTION OF PROCEDURE:  [name] was brought to the operating room on September 26, 2013 after an interscalene block in the left shoulder was placed and the right knee block by anesthesia in the holding room.  He was placed on operative table in supine position.  He received antibiotics preoperatively for prophylaxis.  After being placed under general anesthesia, his left shoulder was examined.  He had full range of motion in his shoulder with stable ligamentous exam.  His right knee was examined under anesthesia.  He had full range of motion.  Knee was stable ligamentous exam with normal patellar tracking.  He was then placed in beach chair position and his left shoulder and arm was prepped using sterile DuraPrep and draped using sterile technique.  Time-out procedure was called and the correct left shoulder and right knee were identified.  Initially, through the posterior arthroscopic portal in his shoulder, initial placement of the arthroscope with a pump attached was placed into an anterior portal and arthroscopic probe was placed.  On initial inspection, the articular cartilage and the glenohumeral joint was intact.  He had partial tearing of the anterior,  superior, and posterior labrum 25% which was debrided.  The anterior-inferior labrum and anterior-inferior glenohumeral ligament complex was intact.  Biceps tendon anchor was slightly hypermobile, but was otherwise well attached. The biceps tendon showed 25-30% partial tearing which was debrided.  The rotator cuff was thoroughly inspected.  He had 30-40% partial tearing of the supraspinatus and subscapularis which was debrided, but they were otherwise well attached and the rest of the rotator cuff was intact. Inferior capsular recess was free of  pathology.  Subacromial space was entered and a lateral arthroscopic portal was made.  Large amount of bursitis was resected.  The rotator cuff was somewhat inflamed on the bursal surface, but not torn.  Impingement was noted and a subacromial decompression was carried out removing 6-8 mm of the undersurface of the anterior, anterolateral, and anteromedial acromion and CA ligament release carried out as well.  The [facility] joint showed significant spurring and degenerative changes and the distal 8-10 mm of clavicle was resected with a 6 mm bur.  At this point, the shoulder could be brought through full range of motion with no impingement on the rotator cuff.  At this point, it is felt that all pathology of the shoulder has been satisfactorily addressed.  The instruments removed.  Portals closed with 3-0 nylon suture and sterile dressings and a sling applied.  At this point, the knee was then laid flat on the operative table and then the right leg was prepped using sterile DuraPrep and draped using sterile technique.  Again time-out procedure was called and the correct right knee identified.  Initially, through an anterolateral portal, the arthroscope with a pump attached was placed into an anteromedial portal, an arthroscopic probe was placed.  On initial inspection of the medial compartment, he only had 25% grade 3 chondromalacia, which was debrided. Medial meniscus showed tearing of the posterior medial horn of which 30% was resected back to a stable rim.  Intercondylar notch inspected. Anterior, posterior cruciate ligaments were normal.  Lateral compartment was inspected.  The articular cartilage showed grade 1 and 2 chondromalacia.  Lateral meniscus showed tearing 20% posterolateral corner, which was resected back to a stable rim.  Patellofemoral joint showed 40-50% grade 3 chondromalacia, which was debrided.  The patella tracked normally.  There was a large medial plica band impinging in  the medial patellofemoral joint and this was thoroughly excised.  Moderate synovitis.  Medial and lateral gutters were debrided, otherwise, this was free of pathology.  After this was done, it was felt that all pathology have been satisfactorily addressed.  The instruments were removed.  Portals closed with 3-0 nylon suture.  Sterile dressings were applied, and then the patient awakened and taken to recovery room in stable condition.  FOLLOWUP CARE:  [name] will be followed as an outpatient on Percocet and Valium with early physical therapy.  He will be seen back in office in a week for sutures out and followup.     [name], M.D.   ______________________________ Elana Alm. Thurston Ross, M.D.    RAW/MEDQ  D:  09/26/2013  T:  09/26/2013  Job:  [id number]

## 2013-09-27 NOTE — Telephone Encounter (Signed)
HAD KNEE SURGERY 10/14. SEE LABS FROM HOSPITAL ENCOUNTER 09/26/13 FOR REVIEW. THANKS.

## 2013-09-28 ENCOUNTER — Encounter (HOSPITAL_BASED_OUTPATIENT_CLINIC_OR_DEPARTMENT_OTHER): Payer: Self-pay | Admitting: Orthopedic Surgery

## 2013-11-01 ENCOUNTER — Other Ambulatory Visit: Payer: Self-pay | Admitting: *Deleted

## 2013-11-01 DIAGNOSIS — G4700 Insomnia, unspecified: Secondary | ICD-10-CM

## 2013-11-01 MED ORDER — ZOLPIDEM TARTRATE 10 MG PO TABS: 10.0000 mg | ORAL_TABLET | Freq: Every evening | ORAL | Status: DC | PRN

## 2013-11-01 NOTE — Telephone Encounter (Signed)
Please call in ambien rx 

## 2013-11-01 NOTE — Telephone Encounter (Signed)
Called in.

## 2013-11-01 NOTE — Telephone Encounter (Signed)
LAST OV 07/25/13. LAST RF 10/03/13.

## 2013-12-17 IMAGING — RF DG C-ARM 61-120 MIN
1 series · 2 of 2 positions shown · non-contrast
Comparison: Multiple priors.

CLINICAL DATA: Back pain

DG C-ARM 1-60 MIN,LUMBAR SPINE - 2-3 VIEW

[Series 1: run · 2 of 2 slices shown]
[im 1/2]
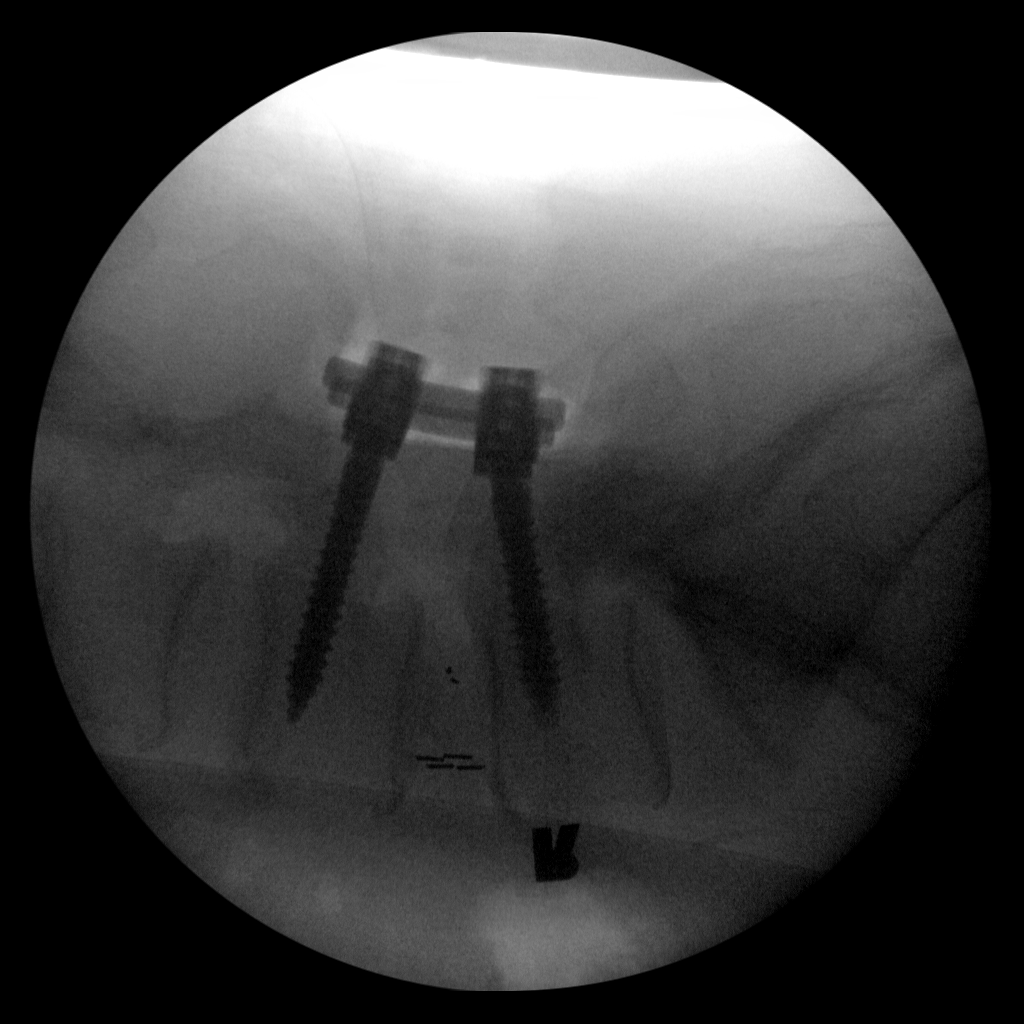
[im 2/2]
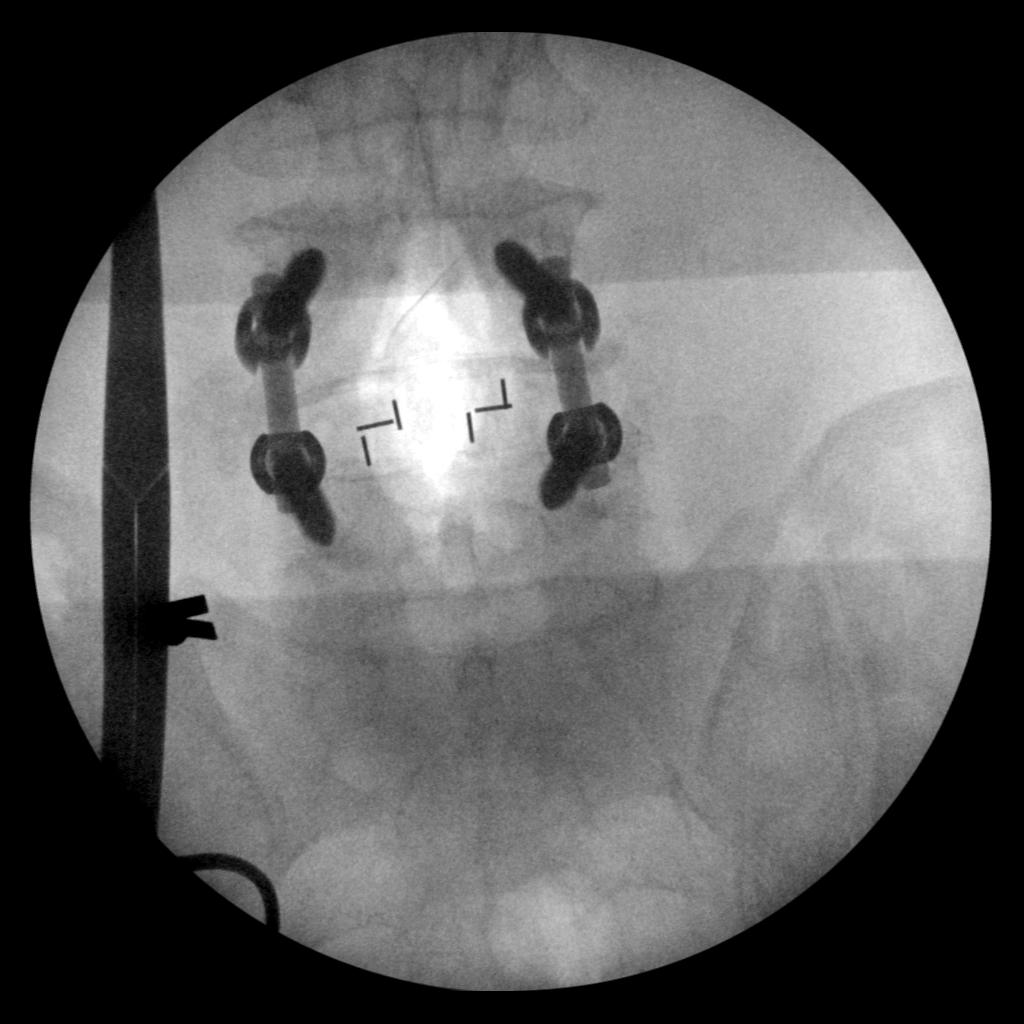

[2 of 2 positions shown; findings below may reference images not displayed]

FINDINGS: C-arm films document L4-5 posterolateral and interbody
fusion.  Anatomic alignment.
IMPRESSION: As above.

## 2014-01-01 ENCOUNTER — Other Ambulatory Visit: Payer: Self-pay

## 2014-01-01 DIAGNOSIS — G4700 Insomnia, unspecified: Secondary | ICD-10-CM

## 2014-01-01 MED ORDER — ZOLPIDEM TARTRATE 10 MG PO TABS: 10.0000 mg | ORAL_TABLET | Freq: Every evening | ORAL | Status: DC | PRN

## 2014-01-01 NOTE — Telephone Encounter (Signed)
Called in.

## 2014-01-01 NOTE — Telephone Encounter (Signed)
NTBS for med refill

## 2014-01-01 NOTE — Telephone Encounter (Signed)
Last seen 07/25/13  MMM If approved route to nurse to call into [facility]

## 2014-01-01 NOTE — Telephone Encounter (Signed)
NTBS but ok to call in 1 refill on [place]

## 2014-01-16 ENCOUNTER — Encounter: Payer: Self-pay | Admitting: Nurse Practitioner

## 2014-01-16 ENCOUNTER — Telehealth: Payer: Self-pay | Admitting: Nurse Practitioner

## 2014-01-16 ENCOUNTER — Ambulatory Visit (INDEPENDENT_AMBULATORY_CARE_PROVIDER_SITE_OTHER): Payer: 59 | Admitting: Nurse Practitioner

## 2014-01-16 VITALS — BP 122/82 | HR 80 | Temp 98.4°F | Ht 70.0 in | Wt 242.0 lb

## 2014-01-16 DIAGNOSIS — K219 Gastro-esophageal reflux disease without esophagitis: Secondary | ICD-10-CM

## 2014-01-16 DIAGNOSIS — F329 Major depressive disorder, single episode, unspecified: Secondary | ICD-10-CM

## 2014-01-16 DIAGNOSIS — E119 Type 2 diabetes mellitus without complications: Secondary | ICD-10-CM

## 2014-01-16 DIAGNOSIS — J309 Allergic rhinitis, unspecified: Secondary | ICD-10-CM

## 2014-01-16 DIAGNOSIS — E785 Hyperlipidemia, unspecified: Secondary | ICD-10-CM

## 2014-01-16 DIAGNOSIS — K645 Perianal venous thrombosis: Secondary | ICD-10-CM

## 2014-01-16 DIAGNOSIS — I10 Essential (primary) hypertension: Secondary | ICD-10-CM

## 2014-01-16 DIAGNOSIS — F3289 Other specified depressive episodes: Secondary | ICD-10-CM

## 2014-01-16 DIAGNOSIS — F32A Depression, unspecified: Secondary | ICD-10-CM

## 2014-01-16 DIAGNOSIS — G4700 Insomnia, unspecified: Secondary | ICD-10-CM

## 2014-01-16 LAB — POCT GLYCOSYLATED HEMOGLOBIN (HGB A1C)

## 2014-01-16 MED ORDER — SIMVASTATIN 40 MG PO TABS: 40.0000 mg | ORAL_TABLET | Freq: Every day | ORAL | Status: DC

## 2014-01-16 MED ORDER — TAMSULOSIN HCL 0.4 MG PO CAPS: 0.4000 mg | ORAL_CAPSULE | Freq: Every day | ORAL | Status: DC

## 2014-01-16 MED ORDER — ZOLPIDEM TARTRATE 10 MG PO TABS: 10.0000 mg | ORAL_TABLET | Freq: Every evening | ORAL | Status: DC | PRN

## 2014-01-16 MED ORDER — OLMESARTAN MEDOXOMIL-HCTZ 40-25 MG PO TABS: 1.0000 | ORAL_TABLET | Freq: Every day | ORAL | Status: DC

## 2014-01-16 MED ORDER — SERTRALINE HCL 100 MG PO TABS: 100.0000 mg | ORAL_TABLET | Freq: Every day | ORAL | Status: DC

## 2014-01-16 MED ORDER — FLUTICASONE PROPIONATE 50 MCG/ACT NA SUSP: 1.0000 | Freq: Every day | NASAL | Status: DC

## 2014-01-16 MED ORDER — METFORMIN HCL 1000 MG PO TABS: 1000.0000 mg | ORAL_TABLET | Freq: Every day | ORAL | Status: DC

## 2014-01-16 MED ORDER — GLUCOSE BLOOD VI STRP: ORAL_STRIP | Status: DC

## 2014-01-16 MED ORDER — HYDROCORTISONE 2.5 % RE CREA: 1.0000 "application " | TOPICAL_CREAM | Freq: Two times a day (BID) | RECTAL | Status: AC

## 2014-01-16 MED ORDER — EZETIMIBE 10 MG PO TABS: 10.0000 mg | ORAL_TABLET | Freq: Every day | ORAL | Status: DC

## 2014-01-16 MED ORDER — OLOPATADINE HCL 0.1 % OP SOLN: 1.0000 [drp] | Freq: Two times a day (BID) | OPHTHALMIC | Status: DC

## 2014-01-16 MED ORDER — ESOMEPRAZOLE MAGNESIUM 40 MG PO CPDR: 40.0000 mg | DELAYED_RELEASE_CAPSULE | Freq: Every day | ORAL | Status: DC

## 2014-01-16 NOTE — Telephone Encounter (Signed)
Pt wanted appt with MMM for external hemorrhoid appt scheduled

## 2014-01-16 NOTE — Patient Instructions (Signed)

## 2014-01-16 NOTE — Progress Notes (Signed)
Subjective:    Patient ID: [name], male    DOB: [birth year], 53 y.o.   MRN: [id number]  Patient in today for follow up- he is doing well except he is c/o bad hemorrhoids. He also want sto be checked for hepatitis c- no symptoms.  Diabetes He presents for his follow-up diabetic visit. He has type 2 diabetes mellitus. The initial diagnosis of diabetes was made 2 years ago. His disease course has been stable. Pertinent negatives for hypoglycemia include no dizziness, headaches or tremors. Pertinent negatives for diabetes include no blurred vision, no chest pain, no fatigue, no polydipsia, no polyphagia, no polyuria and no weight loss. There are no hypoglycemic complications. There are no diabetic complications. Risk factors for coronary artery disease include dyslipidemia and male sex. Current diabetic treatment includes oral agent (monotherapy). He is compliant with treatment all of the time. His weight is stable. He is following a generally healthy diet. When asked about meal planning, he reported none. He participates in exercise daily. He monitors blood glucose at home 1-2 x per day. Blood glucose monitoring compliance is excellent. His overall blood glucose range is 130-140 mg/dl. An ACE inhibitor/angiotensin II receptor blocker is not being taken. Eye exam is not current.  Hypertension This is a chronic problem. The current episode started more than 1 year ago. The problem has been resolved since onset. The problem is controlled. Pertinent negatives include no blurred vision, chest pain, headaches or peripheral edema. Risk factors for coronary artery disease include diabetes mellitus and male gender. Past treatments include diuretics. There are no compliance problems.   Hyperlipidemia This is a chronic problem. The current episode started more than 1 year ago. The problem is uncontrolled. Recent lipid tests were reviewed and are high. Pertinent negatives include no chest pain, leg pain or  myalgias. Current antihyperlipidemic treatment includes ezetimibe. There are no compliance problems.  Risk factors for coronary artery disease include diabetes mellitus, hypertension and male sex.   Depression Pt on Zoloft but feels it needs to be increased  Insomnia Pt having harder time sleeping at night.  Currently on Zoloft. Stated he increased dose to 100 mg and sleep through night.   Review of Systems  Constitutional: Negative for weight loss and fatigue.  Eyes: Negative for blurred vision.  Cardiovascular: Negative for chest pain.  Endocrine: Negative for polydipsia, polyphagia and polyuria.  Musculoskeletal: Negative for myalgias.  Neurological: Negative for dizziness, tremors and headaches.  All other systems reviewed and are negative.       Objective:   Physical Exam  Constitutional: He is oriented to person, place, and time. He appears well-developed and well-nourished.  HENT:  Head: Normocephalic.  Right Ear: External ear normal.  Left Ear: External ear normal.  Nose: Nose normal.  Mouth/Throat: Oropharynx is clear and moist.  Eyes: EOM are normal. Pupils are equal, round, and reactive to light.  Neck: Normal range of motion. Neck supple. No thyromegaly present.  Cardiovascular: Normal rate, regular rhythm, normal heart sounds and intact distal pulses.   No murmur heard. Pulmonary/Chest: Effort normal and breath sounds normal. He has no wheezes. He has no rales.  Abdominal: Soft. Bowel sounds are normal.  Genitourinary: Prostate normal and penis normal.  Thrombosed external hemorrhoid  Musculoskeletal: Normal range of motion.  Neurological: He is alert and oriented to person, place, and time.  + 4/4 monofilament bil No callous formation  Skin: Skin is warm and dry.  Psychiatric: He has a normal mood and affect. His  behavior is normal. Judgment and thought content normal.    BP 122/82  Pulse 80  Temp(Src) 98.4 F (36.9 C) (Oral)  Ht '5\' 10"'  (1.778 m)  Wt  242 lb (109.77 kg)  BMI 34.72 kg/m2     Results for orders placed in visit on 01/16/14  POCT GLYCOSYLATED HEMOGLOBIN (HGB A1C)      Result Value Range   Hemoglobin A1C 7.7%      Assessment & Plan:   1. Type II or unspecified type diabetes mellitus without mention of complication, not stated as uncontrolled   2. Hypertension   3. Other and unspecified hyperlipidemia   4. Insomnia   5. GERD (gastroesophageal reflux disease)   6. Depression   7. Allergic rhinitis   8. Diabetes   9. Hyperlipidemia with target LDL less than 100   10. External hemorrhoid, thrombosed    Orders Placed This Encounter  Procedures  . CMP14+EGFR  . NMR, lipoprofile  . POCT glycosylated hemoglobin (Hb A1C)   Meds ordered this encounter  Medications  . hydrocortisone (ANUSOL-HC) 2.5 % rectal cream    Sig: Place 1 application rectally 2 (two) times daily.    Dispense:  30 g    Refill:  0    Order Specific Question:  Supervising Provider    Answer:  Chipper Herb [[id number]]  . tamsulosin (FLOMAX) 0.4 MG CAPS capsule    Sig: Take 1 capsule (0.4 mg total) by mouth daily.    Dispense:  30 capsule    Refill:  5    Order Specific Question:  Supervising Provider    Answer:  Chipper Herb [[id number]]  . fluticasone (FLONASE) 50 MCG/ACT nasal spray    Sig: Place 1 spray into both nostrils daily.    Dispense:  16 g    Refill:  5    Order Specific Question:  Supervising Provider    Answer:  Chipper Herb [[id number]]  . olopatadine (PATANOL) 0.1 % ophthalmic solution    Sig: Place 1 drop into both eyes 2 (two) times daily.    Dispense:  5 mL    Refill:  5    Order Specific Question:  Supervising Provider    Answer:  Chipper Herb [[id number]]  . olmesartan-hydrochlorothiazide (BENICAR HCT) 40-25 MG per tablet    Sig: Take 1 tablet by mouth daily.    Dispense:  30 tablet    Refill:  5    Order Specific Question:  Supervising Provider    Answer:  Chipper Herb [[id number]]  . metFORMIN (GLUCOPHAGE) 1000 MG tablet     Sig: Take 1 tablet (1,000 mg total) by mouth at bedtime.    Dispense:  30 tablet    Refill:  5    Order Specific Question:  Supervising Provider    Answer:  Chipper Herb [[id number]]  . zolpidem (AMBIEN) 10 MG tablet    Sig: Take 1 tablet (10 mg total) by mouth at bedtime as needed. For sleep    Dispense:  30 tablet    Refill:  1    Order Specific Question:  Supervising Provider    Answer:  Chipper Herb [[id number]]  . ezetimibe (ZETIA) 10 MG tablet    Sig: Take 1 tablet (10 mg total) by mouth daily.    Dispense:  30 tablet    Refill:  5    Order Specific Question:  Supervising Provider    Answer:  Chipper Herb [[id number]]  . sertraline (ZOLOFT) 100  MG tablet    Sig: Take 1 tablet (100 mg total) by mouth daily.    Dispense:  30 tablet    Refill:  5    Order Specific Question:  Supervising Provider    Answer:  Chipper Herb [[id number]]  . simvastatin (ZOCOR) 40 MG tablet    Sig: Take 1 tablet (40 mg total) by mouth at bedtime.    Dispense:  30 tablet    Refill:  5    Order Specific Question:  Supervising Provider    Answer:  Chipper Herb [[id number]]  . esomeprazole (NEXIUM) 40 MG capsule    Sig: Take 1 capsule (40 mg total) by mouth daily before breakfast.    Dispense:  30 capsule    Refill:  5    Order Specific Question:  Supervising Provider    Answer:  Chipper Herb [[id number]]  . glucose blood (ONE TOUCH ULTRA TEST) test strip    Sig: Test 1 x per day and prn-- dx 250.01    Dispense:  100 each    Refill:  12    Order Specific Question:  Supervising Provider    Answer:  Chipper Herb [[id number]]   Continue stool softner- avoid strianing when having BM- call if not improving Labs pending Health maintenance reviewed Diet and exercise encouraged- strict carb counting Continue all meds Follow up  In 3 months   [place], FNP

## 2014-01-17 LAB — CMP14+EGFR
ALBUMIN: 4.8 g/dL (ref 3.5–5.5)
ALT: 39 IU/L (ref 0–44)
AST: 50 IU/L — ABNORMAL HIGH (ref 0–40)
Albumin/Globulin Ratio: 2.4 (ref 1.1–2.5)
Alkaline Phosphatase: 44 IU/L (ref 39–117)
BUN/Creatinine Ratio: 27 — ABNORMAL HIGH (ref 9–20)
BUN: 19 mg/dL (ref 6–24)
CALCIUM: 9.5 mg/dL (ref 8.7–10.2)
CO2: 25 mmol/L (ref 18–29)
CREATININE: 0.7 mg/dL — AB (ref 0.76–1.27)
Chloride: 98 mmol/L (ref 97–108)
GFR calc Af Amer: 124 mL/min/{1.73_m2} (ref >59)
GFR calc non Af Amer: 107 mL/min/{1.73_m2} (ref >59)
GLOBULIN, TOTAL: 2 g/dL (ref 1.5–4.5)
Glucose: 142 mg/dL — ABNORMAL HIGH (ref 65–99)
Potassium: 4.4 mmol/L (ref 3.5–5.2)
Sodium: 140 mmol/L (ref 134–144)
Total Bilirubin: 0.5 mg/dL (ref 0.0–1.2)
Total Protein: 6.8 g/dL (ref 6.0–8.5)

## 2014-01-17 LAB — NMR, LIPOPROFILE
CHOLESTEROL: 134 mg/dL (ref <200)
HDL CHOLESTEROL BY NMR: 44 mg/dL (ref >=40)
HDL PARTICLE NUMBER: 35.3 umol/L (ref >=30.5)
LDL Particle Number: 1207 nmol/L — ABNORMAL HIGH (ref <1000)
LDL Size: 20.1 nm — ABNORMAL LOW (ref >20.5)
LDLC SERPL CALC-MCNC: 63 mg/dL (ref <100)
LP-IR Score: 65 — ABNORMAL HIGH (ref <=45)
Small LDL Particle Number: 790 nmol/L — ABNORMAL HIGH (ref <=527)
Triglycerides by NMR: 133 mg/dL (ref <150)

## 2014-03-18 ENCOUNTER — Other Ambulatory Visit: Payer: Self-pay | Admitting: Nurse Practitioner

## 2014-03-19 NOTE — Telephone Encounter (Signed)
Patient last seen in office on 01-16-14. Rx last filled on 01-01-14 for #30. Please advise. If approved please route to Pool B so nurse can phone in to pharmacy

## 2014-03-19 NOTE — Telephone Encounter (Signed)
Called refill in to pharmacy    

## 2014-03-19 NOTE — Telephone Encounter (Signed)
Please call in ambien with 1 refills 

## 2014-04-15 ENCOUNTER — Ambulatory Visit: Payer: 59 | Admitting: Nurse Practitioner

## 2014-05-02 ENCOUNTER — Ambulatory Visit: Payer: 59 | Admitting: Nurse Practitioner

## 2014-05-10 ENCOUNTER — Other Ambulatory Visit: Payer: Self-pay | Admitting: Nurse Practitioner

## 2014-05-13 NOTE — Telephone Encounter (Signed)
Please call in ambien with 1 refills 

## 2014-05-13 NOTE — Telephone Encounter (Signed)
Last seen 01/16/14  MMM  If approved route to nurse to call into [facility] 

## 2014-05-13 NOTE — Telephone Encounter (Signed)
Called in.

## 2014-07-17 ENCOUNTER — Other Ambulatory Visit: Payer: Self-pay | Admitting: Nurse Practitioner

## 2014-07-19 NOTE — Telephone Encounter (Signed)
Please call in [place] with 0 refills Patient NTBS for follow up and lab work

## 2014-07-19 NOTE — Telephone Encounter (Signed)
Last seen 01/16/14  MMM  If approved route to nurse to call into [facility]

## 2014-07-20 NOTE — Telephone Encounter (Signed)
Called in.

## 2014-08-03 ENCOUNTER — Other Ambulatory Visit: Payer: Self-pay | Admitting: Nurse Practitioner

## 2014-08-06 NOTE — Telephone Encounter (Signed)
no more refills without being seen Please call in [place] with 0 refills

## 2014-08-06 NOTE — Telephone Encounter (Signed)
Patient last seen in office on 01-17-14. Rx last filled on 07-20-14 for #30. Please advise on both meds. Was to return in May for follow up and did not. If approved please route to Pool B so nurse can phone in to pharmacy

## 2014-08-06 NOTE — Telephone Encounter (Signed)
Called to [facility] and pt needs to make appt.

## 2014-08-20 ENCOUNTER — Other Ambulatory Visit: Payer: Self-pay | Admitting: Nurse Practitioner

## 2014-08-21 NOTE — Telephone Encounter (Signed)
no more refills without being seen  

## 2014-08-21 NOTE — Telephone Encounter (Signed)
Last ov 2/15. Last AIC- 7.7. ntbs.

## 2014-09-03 ENCOUNTER — Other Ambulatory Visit: Payer: Self-pay | Admitting: Nurse Practitioner

## 2014-09-04 NOTE — Telephone Encounter (Signed)
Please call in ambien with 0 refills no more refills without being seen  

## 2014-09-04 NOTE — Telephone Encounter (Signed)
Not seen since 01/2014

## 2014-09-05 NOTE — Telephone Encounter (Signed)
Called in.

## 2014-09-25 ENCOUNTER — Other Ambulatory Visit: Payer: Self-pay | Admitting: Nurse Practitioner

## 2014-09-27 NOTE — Telephone Encounter (Signed)
Patient notified at last refill that NTBS. Please advise 

## 2014-09-27 NOTE — Telephone Encounter (Signed)
NO more refills without being seen

## 2014-10-09 ENCOUNTER — Telehealth: Payer: Self-pay | Admitting: Nurse Practitioner

## 2014-10-09 NOTE — Telephone Encounter (Signed)
Appt given

## 2014-10-10 ENCOUNTER — Other Ambulatory Visit: Payer: Self-pay | Admitting: Nurse Practitioner

## 2014-10-14 NOTE — Telephone Encounter (Signed)
Please call in ambien with 1 refills 

## 2014-10-14 NOTE — Telephone Encounter (Signed)
Patient last seen in office on 01-16-14. Rx last filled on 09-05-14 for #30. Has an upcoming appt. Please advise on refill

## 2014-10-15 ENCOUNTER — Encounter (INDEPENDENT_AMBULATORY_CARE_PROVIDER_SITE_OTHER): Payer: Self-pay

## 2014-10-15 ENCOUNTER — Encounter: Payer: Self-pay | Admitting: Nurse Practitioner

## 2014-10-15 ENCOUNTER — Ambulatory Visit (INDEPENDENT_AMBULATORY_CARE_PROVIDER_SITE_OTHER): Payer: 59 | Admitting: Nurse Practitioner

## 2014-10-15 VITALS — BP 103/71 | HR 79 | Temp 97.1°F | Ht 70.0 in | Wt 235.0 lb

## 2014-10-15 DIAGNOSIS — K219 Gastro-esophageal reflux disease without esophagitis: Secondary | ICD-10-CM

## 2014-10-15 DIAGNOSIS — F329 Major depressive disorder, single episode, unspecified: Secondary | ICD-10-CM

## 2014-10-15 DIAGNOSIS — G8929 Other chronic pain: Secondary | ICD-10-CM

## 2014-10-15 DIAGNOSIS — I10 Essential (primary) hypertension: Secondary | ICD-10-CM

## 2014-10-15 DIAGNOSIS — F32A Depression, unspecified: Secondary | ICD-10-CM

## 2014-10-15 DIAGNOSIS — E119 Type 2 diabetes mellitus without complications: Secondary | ICD-10-CM

## 2014-10-15 DIAGNOSIS — M549 Dorsalgia, unspecified: Secondary | ICD-10-CM

## 2014-10-15 DIAGNOSIS — G4700 Insomnia, unspecified: Secondary | ICD-10-CM

## 2014-10-15 DIAGNOSIS — Z23 Encounter for immunization: Secondary | ICD-10-CM

## 2014-10-15 DIAGNOSIS — E785 Hyperlipidemia, unspecified: Secondary | ICD-10-CM

## 2014-10-15 LAB — POCT UA - MICROALBUMIN: Microalbumin Ur, POC: 20 mg/L

## 2014-10-15 LAB — POCT GLYCOSYLATED HEMOGLOBIN (HGB A1C)

## 2014-10-15 LAB — HM DIABETES EYE EXAM

## 2014-10-15 MED ORDER — SERTRALINE HCL 100 MG PO TABS: ORAL_TABLET | ORAL | Status: DC

## 2014-10-15 NOTE — Addendum Note (Signed)
Addended by: Selmer Dominion on: 10/15/2014 02:42 PM   Modules accepted: Orders

## 2014-10-15 NOTE — Addendum Note (Signed)
Addended by: Chevis Pretty on: 10/15/2014 02:45 PM   Modules accepted: Orders, Medications

## 2014-10-15 NOTE — Progress Notes (Signed)
Subjective:    Patient ID: [name], male    DOB: July 20, 1961, 53 y.o.   MRN: [id number]   Patient here today for follow up of chronic medical problems.  Hypertension This is a chronic problem. The current episode started more than 1 year ago. The problem is unchanged. The problem is controlled. Pertinent negatives include no headaches, neck pain, palpitations, peripheral edema or shortness of breath. There are no associated agents to hypertension. Risk factors for coronary artery disease include dyslipidemia, diabetes mellitus, male gender and obesity. Past treatments include angiotensin blockers and diuretics. The current treatment provides significant improvement. Compliance problems include diet and exercise.   Hyperlipidemia This is a chronic problem. The current episode started more than 1 year ago. The problem is uncontrolled. Recent lipid tests were reviewed and are high. Exacerbating diseases include diabetes and obesity. He has no history of hypothyroidism. Factors aggravating his hyperlipidemia include thiazides. Pertinent negatives include no shortness of breath. Current antihyperlipidemic treatment includes statins and ezetimibe. The current treatment provides moderate improvement of lipids. Compliance problems include adherence to diet and adherence to exercise.  Risk factors for coronary artery disease include dyslipidemia, family history, hypertension and male sex.  Diabetes He presents for his follow-up diabetic visit. He has type 2 diabetes mellitus. No MedicAlert identification noted. There are no hypoglycemic associated symptoms. Pertinent negatives for hypoglycemia include no headaches. There are no diabetic associated symptoms. There are no hypoglycemic complications. Risk factors for coronary artery disease include dyslipidemia, diabetes mellitus, hypertension, male sex and obesity. Current diabetic treatment includes oral agent (monotherapy). He is compliant with treatment  most of the time. His weight is stable. When asked about meal planning, he reported none. He has not had a previous visit with a dietitian. His breakfast blood glucose is taken between 8-9 am. His breakfast blood glucose range is generally 130-140 mg/dl. His overall blood glucose range is 130-140 mg/dl. (Does not check very often) An ACE inhibitor/angiotensin II receptor blocker is being taken. He does not see a podiatrist.Eye exam is not current.  GERD nexium works well- no symptoms when takes med Depression zoloft working well without side effects. He has been going through a lot- wife and him have separated. insomnia Lorrin Mais helps him rest well without side effects Back pain Valium and pain meds jelp but he always has back pain BPH flomax helps with symptoms denies and trouble voiding    Review of Systems  Respiratory: Negative for shortness of breath.   Cardiovascular: Negative for palpitations.  Musculoskeletal: Negative for neck pain.  Neurological: Negative for headaches.       Objective:   Physical Exam  Constitutional: He is oriented to person, place, and time. He appears well-developed and well-nourished.  HENT:  Head: Normocephalic.  Right Ear: External ear normal.  Left Ear: External ear normal.  Nose: Nose normal.  Mouth/Throat: Oropharynx is clear and moist.  Eyes: EOM are normal. Pupils are equal, round, and reactive to light.  Neck: Normal range of motion. Neck supple. No JVD present. No thyromegaly present.  Cardiovascular: Normal rate, regular rhythm, normal heart sounds and intact distal pulses.  Exam reveals no gallop and no friction rub.   No murmur heard. Pulmonary/Chest: Effort normal and breath sounds normal. No respiratory distress. He has no wheezes. He has no rales. He exhibits no tenderness.  Abdominal: Soft. Bowel sounds are normal. He exhibits no mass. There is no tenderness.  Genitourinary: Prostate normal and penis normal.  Musculoskeletal: Normal  range  of motion. He exhibits no edema.  Lymphadenopathy:    He has no cervical adenopathy.  Neurological: He is alert and oriented to person, place, and time. No cranial nerve deficit.  Skin: Skin is warm and dry.  Psychiatric: He has a normal mood and affect. His behavior is normal. Judgment and thought content normal.   BP 103/71 mmHg  Pulse 79  Temp(Src) 97.1 F (36.2 C) (Oral)  Ht '5\' 10"'  (1.778 m)  Wt 235 lb (106.595 kg)  BMI 33.72 kg/m2   Results for orders placed or performed in visit on 10/15/14  POCT glycosylated hemoglobin (Hb A1C)  Result Value Ref Range   Hemoglobin A1C 6.7%   HM DIABETES EYE EXAM  Result Value Ref Range   HM Diabetic Eye Exam No Retinopathy No Retinopathy         Assessment & Plan:  1. Essential hypertension Low NA+ diet - CMP14+EGFR  2. Hyperlipidemia with target LDL less than 100 Watch fats in diet - NMR, lipoprofile  3. Type 2 diabetes mellitus without complication Count carbs - POCT glycosylated hemoglobin (Hb A1C) - POCT UA - Microalbumin  4. Gastroesophageal reflux disease without esophagitis Avoid spicy and fatty foods  5. Depression Stress management Grief counseling  6. Insomnia Bedtime ritual  7. Chronic back pain Continue current meds    Labs pending Health maintenance reviewed Diet and exercise encouraged Continue all meds Follow up  In 3 months   [place], FNP

## 2014-10-15 NOTE — Patient Instructions (Signed)
Diabetes and Foot Care Diabetes may cause you to have problems because of poor blood supply (circulation) to your feet and legs. This may cause the skin on your feet to become thinner, break easier, and heal more slowly. Your skin may become dry, and the skin may peel and crack. You may also have nerve damage in your legs and feet causing decreased feeling in them. You may not notice minor injuries to your feet that could lead to infections or more serious problems. Taking care of your feet is one of the most important things you can do for yourself.  HOME CARE INSTRUCTIONS  Wear shoes at all times, even in the house. Do not go barefoot. Bare feet are easily injured.  Check your feet daily for blisters, cuts, and redness. If you cannot see the bottom of your feet, use a mirror or ask someone for help.  Wash your feet with warm water (do not use hot water) and mild soap. Then pat your feet and the areas between your toes until they are completely dry. Do not soak your feet as this can dry your skin.  Apply a moisturizing lotion or petroleum jelly (that does not contain alcohol and is unscented) to the skin on your feet and to dry, brittle toenails. Do not apply lotion between your toes.  Trim your toenails straight across. Do not dig under them or around the cuticle. File the edges of your nails with an emery board or nail file.  Do not cut corns or calluses or try to remove them with medicine.  Wear clean socks or stockings every day. Make sure they are not too tight. Do not wear knee-high stockings since they may decrease blood flow to your legs.  Wear shoes that fit properly and have enough cushioning. To break in new shoes, wear them for just a few hours a day. This prevents you from injuring your feet. Always look in your shoes before you put them on to be sure there are no objects inside.  Do not cross your legs. This may decrease the blood flow to your feet.  If you find a minor scrape,  cut, or break in the skin on your feet, keep it and the skin around it clean and dry. These areas may be cleansed with mild soap and water. Do not cleanse the area with peroxide, alcohol, or iodine.  When you remove an adhesive bandage, be sure not to damage the skin around it.  If you have a wound, look at it several times a day to make sure it is healing.  Do not use heating pads or hot water bottles. They may burn your skin. If you have lost feeling in your feet or legs, you may not know it is happening until it is too late.  Make sure your health care provider performs a complete foot exam at least annually or more often if you have foot problems. Report any cuts, sores, or bruises to your health care provider immediately. SEEK MEDICAL CARE IF:   You have an injury that is not healing.  You have cuts or breaks in the skin.  You have an ingrown nail.  You notice redness on your legs or feet.  You feel burning or tingling in your legs or feet.  You have pain or cramps in your legs and feet.  Your legs or feet are numb.  Your feet always feel cold. SEEK IMMEDIATE MEDICAL CARE IF:   There is increasing redness,   swelling, or pain in or around a wound.  There is a red line that goes up your leg.  Pus is coming from a wound.  You develop a fever or as directed by your health care provider.  You notice a bad smell coming from an ulcer or wound. Document Released: 11/26/2000 Document Revised: 08/01/2013 Document Reviewed: 05/08/2013 Exit[employer] Patient Information 2015 [employer]. This information is not intended to replace advice given to you by your health care provider. Make sure you discuss any questions you have with your health care provider.  

## 2014-10-15 NOTE — Telephone Encounter (Signed)
Called in.

## 2014-10-16 ENCOUNTER — Ambulatory Visit: Payer: 59 | Admitting: Nurse Practitioner

## 2014-10-16 LAB — NMR, LIPOPROFILE
Cholesterol: 154 mg/dL (ref 100–199)
HDL CHOLESTEROL BY NMR: 42 mg/dL (ref >39)
HDL PARTICLE NUMBER: 32.2 umol/L (ref >=30.5)
LDL Particle Number: 1482 nmol/L — ABNORMAL HIGH (ref <1000)
LDL Size: 20 nm (ref >20.5)
LDL-C: 78 mg/dL (ref 0–99)
LP-IR Score: 72 — ABNORMAL HIGH (ref <=45)
SMALL LDL PARTICLE NUMBER: 1010 nmol/L — AB (ref <=527)
Triglycerides by NMR: 170 mg/dL — ABNORMAL HIGH (ref 0–149)

## 2014-10-16 LAB — CMP14+EGFR
A/G RATIO: 2.2 (ref 1.1–2.5)
ALT: 40 IU/L (ref 0–44)
AST: 38 IU/L (ref 0–40)
Albumin: 4.8 g/dL (ref 3.5–5.5)
Alkaline Phosphatase: 39 IU/L (ref 39–117)
BUN / CREAT RATIO: 11 (ref 9–20)
BUN: 11 mg/dL (ref 6–24)
CO2: 22 mmol/L (ref 18–29)
CREATININE: 0.97 mg/dL (ref 0.76–1.27)
Calcium: 9.6 mg/dL (ref 8.7–10.2)
Chloride: 97 mmol/L (ref 97–108)
GFR calc Af Amer: 101 mL/min/{1.73_m2} (ref >59)
GFR calc non Af Amer: 88 mL/min/{1.73_m2} (ref >59)
GLOBULIN, TOTAL: 2.2 g/dL (ref 1.5–4.5)
Glucose: 169 mg/dL — ABNORMAL HIGH (ref 65–99)
Potassium: 4.2 mmol/L (ref 3.5–5.2)
SODIUM: 139 mmol/L (ref 134–144)
Total Bilirubin: 0.6 mg/dL (ref 0.0–1.2)
Total Protein: 7 g/dL (ref 6.0–8.5)

## 2014-10-16 LAB — MICROALBUMIN, URINE: Microalbumin, Urine: <3 ug/mL (ref 0.0–17.0)

## 2014-10-18 ENCOUNTER — Telehealth: Payer: Self-pay | Admitting: Family Medicine

## 2014-10-18 NOTE — Telephone Encounter (Signed)
-----   Message from [facility], [place] sent at 10/17/2014 12:52 PM EST ----- microalbumin negative Hgba1c discussed at appointment Kidney and liver function stable LDL particle numbers are increasing slightly- make sure watches fat in diet- if worsens will have to change statin

## 2014-10-18 NOTE — Telephone Encounter (Signed)
PATIENT AWARE

## 2014-10-22 ENCOUNTER — Encounter: Payer: Self-pay | Admitting: *Deleted

## 2014-10-29 ENCOUNTER — Other Ambulatory Visit: Payer: Self-pay | Admitting: Nurse Practitioner

## 2014-11-13 ENCOUNTER — Other Ambulatory Visit: Payer: Self-pay | Admitting: Nurse Practitioner

## 2014-11-14 NOTE — Telephone Encounter (Signed)
Patient last seen in office on 10-15-14. Rx last filled on 10-16-14 for #30. Please advise. If approved please route to Pool B so nurse can phone in to pharmacy

## 2014-11-16 NOTE — Telephone Encounter (Signed)
Please call in ambien with 1 refills 

## 2014-11-27 ENCOUNTER — Ambulatory Visit: Payer: 59 | Admitting: Nurse Practitioner

## 2014-12-27 ENCOUNTER — Other Ambulatory Visit: Payer: Self-pay | Admitting: Nurse Practitioner

## 2015-01-04 ENCOUNTER — Other Ambulatory Visit: Payer: Self-pay | Admitting: Nurse Practitioner

## 2015-01-06 NOTE — Telephone Encounter (Signed)
Please call in ambien with 1 refills 

## 2015-01-06 NOTE — Telephone Encounter (Signed)
rx called into pharmacy

## 2015-01-06 NOTE — Telephone Encounter (Signed)
Last seen 10/15/14  MMM  This med not on EPIC list   If approved route to nurse to call into [facility]

## 2015-01-24 ENCOUNTER — Other Ambulatory Visit: Payer: Self-pay | Admitting: Nurse Practitioner

## 2015-01-31 ENCOUNTER — Other Ambulatory Visit: Payer: Self-pay | Admitting: Nurse Practitioner

## 2015-02-05 ENCOUNTER — Other Ambulatory Visit: Payer: Self-pay | Admitting: Nurse Practitioner

## 2015-02-06 NOTE — Telephone Encounter (Signed)
Please call in ambien with 1 refills 

## 2015-02-06 NOTE — Telephone Encounter (Signed)
Last filled 01/06/15, last seen 10/15/14. Call into [facility]

## 2015-02-06 NOTE — Telephone Encounter (Signed)
Rx called to [facility] and patient notified

## 2015-02-13 ENCOUNTER — Other Ambulatory Visit: Payer: Self-pay | Admitting: Nurse Practitioner

## 2015-02-24 ENCOUNTER — Telehealth: Payer: Self-pay | Admitting: Nurse Practitioner

## 2015-02-24 NOTE — Telephone Encounter (Signed)
Appointment given for 10am with Ronnald Collum, FNP

## 2015-02-25 ENCOUNTER — Encounter: Payer: Self-pay | Admitting: Nurse Practitioner

## 2015-02-25 ENCOUNTER — Ambulatory Visit (INDEPENDENT_AMBULATORY_CARE_PROVIDER_SITE_OTHER): Payer: 59 | Admitting: Nurse Practitioner

## 2015-02-25 VITALS — BP 110/73 | HR 90 | Temp 98.5°F | Ht 70.0 in | Wt 231.4 lb

## 2015-02-25 DIAGNOSIS — E119 Type 2 diabetes mellitus without complications: Secondary | ICD-10-CM | POA: Diagnosis not present

## 2015-02-25 DIAGNOSIS — N400 Enlarged prostate without lower urinary tract symptoms: Secondary | ICD-10-CM

## 2015-02-25 DIAGNOSIS — G4700 Insomnia, unspecified: Secondary | ICD-10-CM

## 2015-02-25 DIAGNOSIS — E785 Hyperlipidemia, unspecified: Secondary | ICD-10-CM

## 2015-02-25 DIAGNOSIS — I10 Essential (primary) hypertension: Secondary | ICD-10-CM | POA: Diagnosis not present

## 2015-02-25 DIAGNOSIS — Z860100 Personal history of colon polyps, unspecified: Secondary | ICD-10-CM

## 2015-02-25 DIAGNOSIS — K219 Gastro-esophageal reflux disease without esophagitis: Secondary | ICD-10-CM

## 2015-02-25 LAB — POCT GLYCOSYLATED HEMOGLOBIN (HGB A1C): Hemoglobin A1C: 7.3

## 2015-02-25 MED ORDER — ZOLPIDEM TARTRATE 10 MG PO TABS: 10.0000 mg | ORAL_TABLET | Freq: Every evening | ORAL | Status: DC | PRN

## 2015-02-25 MED ORDER — METFORMIN HCL 1000 MG PO TABS: 1000.0000 mg | ORAL_TABLET | Freq: Every day | ORAL | Status: DC

## 2015-02-25 MED ORDER — SIMVASTATIN 40 MG PO TABS: 40.0000 mg | ORAL_TABLET | Freq: Every day | ORAL | Status: DC

## 2015-02-25 MED ORDER — OXYCODONE-ACETAMINOPHEN 10-325 MG PO TABS: ORAL_TABLET | ORAL | Status: DC

## 2015-02-25 MED ORDER — TADALAFIL 5 MG PO TABS: 5.0000 mg | ORAL_TABLET | Freq: Every day | ORAL | Status: DC | PRN

## 2015-02-25 MED ORDER — EZETIMIBE 10 MG PO TABS: 10.0000 mg | ORAL_TABLET | Freq: Every day | ORAL | Status: DC

## 2015-02-25 MED ORDER — ESOMEPRAZOLE MAGNESIUM 40 MG PO CPDR: DELAYED_RELEASE_CAPSULE | ORAL | Status: DC

## 2015-02-25 NOTE — Patient Instructions (Signed)

## 2015-02-25 NOTE — Addendum Note (Signed)
Addended by: Chevis Pretty on: 02/25/2015 11:21 AM   Modules accepted: Orders

## 2015-02-25 NOTE — Progress Notes (Signed)
Subjective:    Patient ID: [name], male    DOB: 1961-10-09, 54 y.o.   MRN: [id number]   Patient here today for follow up of chronic medical problems. The patient reports he was started on Meloxicam by Dr. Noemi Chapel for his chronic left shoulder pain 1 month ago and has been experiencing symptoms of hyperglycemia since unpredictably through the day. He reports his AM glucose checks usually run in the 90-105 range but have been 170-180 consistently for the last 2 weeks, and he reports experiencing new onset tingling in bilateral feet. He is also complaining of a suprapubic tingling irritated sensation onset 1 month ago with hesitancy, intermittent burning and nocturia.   Hyperglycemia This is a chronic problem. The current episode started more than 1 year ago. The problem has been waxing and waning. Associated symptoms include diaphoresis (Some night sweats recently ). Pertinent negatives include no chest pain, chills, headaches, nausea or neck pain. Associated symptoms comments: Tingling in bilateral feet. . Nothing aggravates the symptoms. He has tried rest, position changes, lying down and walking for the symptoms. The treatment provided significant relief.  Hypertension This is a chronic problem. The current episode started more than 1 year ago. The problem is unchanged. The problem is controlled. Pertinent negatives include no chest pain, headaches, neck pain, palpitations, peripheral edema or shortness of breath. There are no associated agents to hypertension. Risk factors for coronary artery disease include dyslipidemia, diabetes mellitus, male gender and obesity. Past treatments include angiotensin blockers and diuretics. The current treatment provides significant improvement. Compliance problems include diet and exercise.   Hyperlipidemia This is a chronic problem. The current episode started more than 1 year ago. The problem is uncontrolled. Recent lipid tests were reviewed and are high.  Exacerbating diseases include diabetes and obesity. He has no history of hypothyroidism. Factors aggravating his hyperlipidemia include thiazides. Pertinent negatives include no chest pain or shortness of breath. Current antihyperlipidemic treatment includes statins and ezetimibe. The current treatment provides moderate improvement of lipids. Compliance problems include adherence to diet and adherence to exercise.  Risk factors for coronary artery disease include dyslipidemia, family history, hypertension and male sex.  Diabetes He presents for his follow-up diabetic visit. He has type 2 diabetes mellitus. No MedicAlert identification noted. There are no hypoglycemic associated symptoms. Pertinent negatives for hypoglycemia include no headaches. Associated symptoms include foot paresthesias (bilateral foot tingling last month). Pertinent negatives for diabetes include no chest pain. There are no hypoglycemic complications. Risk factors for coronary artery disease include dyslipidemia, diabetes mellitus, hypertension, male sex and obesity. Current diabetic treatment includes oral agent (monotherapy). He is compliant with treatment most of the time. His weight is stable. When asked about meal planning, he reported none. He has not had a previous visit with a dietitian. His breakfast blood glucose is taken between 8-9 am. His breakfast blood glucose range is generally 130-140 mg/dl. His overall blood glucose range is 130-140 mg/dl. (Does not check very often) An ACE inhibitor/angiotensin II receptor blocker is being taken. He does not see a podiatrist.Eye exam is not current.  GERD nexium works well- no symptoms when takes med Depression He reports he weaned himself off of the Zoloft-last dose 01/09/2015 and has been feeling well since. Feels very positive about his situation.  insomnia Lorrin Mais helps him rest well without side effects Back pain He states he always has baseline back pain, but will only take  valium and pain medications after increased activity and pain.  BPH flomax  was helping with his symptoms but does not seem as effective in the last month.     Review of Systems  Constitutional: Positive for diaphoresis (Some night sweats recently ). Negative for chills and unexpected weight change.  Eyes: Negative for visual disturbance.  Respiratory: Negative for chest tightness and shortness of breath.   Cardiovascular: Negative for chest pain and palpitations.  Gastrointestinal: Negative for nausea.  Musculoskeletal: Negative for neck pain.  Neurological: Negative for headaches.  All other systems reviewed and are negative.      Objective:   Physical Exam  Constitutional: He is oriented to person, place, and time. He appears well-developed and well-nourished.  HENT:  Head: Normocephalic.  Right Ear: External ear normal.  Left Ear: External ear normal.  Nose: Nose normal.  Mouth/Throat: Oropharynx is clear and moist.  Eyes: EOM are normal. Pupils are equal, round, and reactive to light.  Neck: Normal range of motion. Neck supple. No JVD present. No thyromegaly present.  Cardiovascular: Normal rate, regular rhythm, normal heart sounds and intact distal pulses.  Exam reveals no gallop and no friction rub.   No murmur heard. Pulmonary/Chest: Effort normal and breath sounds normal. No respiratory distress. He has no wheezes. He has no rales. He exhibits no tenderness.  Abdominal: Soft. Bowel sounds are normal. He exhibits no mass. There is no tenderness.  Genitourinary:  Prostate exam deferred to urology  Musculoskeletal: Normal range of motion. He exhibits no edema.  Lymphadenopathy:    He has no cervical adenopathy.  Neurological: He is alert and oriented to person, place, and time. No cranial nerve deficit.  Skin: Skin is warm and dry.  Psychiatric: He has a normal mood and affect. His behavior is normal. Judgment and thought content normal.   BP 110/73 mmHg  Pulse 90   Temp(Src) 98.5 F (36.9 C) (Oral)  Ht _0  (1.778 m)  Wt 231 lb 6.4 oz (104.962 kg)  BMI 33.20 kg/m2'         Assessment & Plan:  1. Essential hypertension Do not ad salt to diet - CMP14+EGFR  2. Gastroesophageal reflux disease without esophagitis Avoid spicy and fatty foods Do not eat 2 hours prior to bedtime - esomeprazole (NEXIUM) 40 MG capsule; TAKE 1 CAPSULE DAILY BEFORE BREAKFAST  Dispense: 30 capsule; Refill: 5  3. Type 2 diabetes mellitus without complication Strict carb counting - POCT glycosylated hemoglobin (Hb A1C) - metFORMIN (GLUCOPHAGE) 1000 MG tablet; Take 1 tablet (1,000 mg total) by mouth at bedtime.  Dispense: 30 tablet; Refill: 5  4. Hyperlipidemia with target LDL less than 100 Low fat diet - NMR, lipoprofile - ezetimibe (ZETIA) 10 MG tablet; Take 1 tablet (10 mg total) by mouth daily.  Dispense: 30 tablet; Refill: 5 - simvastatin (ZOCOR) 40 MG tablet; Take 1 tablet (40 mg total) by mouth at bedtime.  Dispense: 30 tablet; Refill: 5  5. Insomnia Bedtime ritual - zolpidem (AMBIEN) 10 MG tablet; Take 1 tablet (10 mg total) by mouth at bedtime as needed.  Dispense: 30 tablet; Refill: 1  6. Hx of colonic polyps - Ambulatory referral to Gastroenterology  7. BPH (benign prostatic hyperplasia) Keep appointment with urology - PSA, total and free - tadalafil (CIALIS) 5 MG tablet; Take 1 tablet (5 mg total) by mouth daily as needed for erectile dysfunction.  Dispense: 10 tablet; Refill: 0    Labs pending Health maintenance reviewed Diet and exercise encouraged Continue all meds Follow up  In 3 month   [place], FNP

## 2015-02-26 ENCOUNTER — Telehealth: Payer: Self-pay | Admitting: Nurse Practitioner

## 2015-02-26 ENCOUNTER — Other Ambulatory Visit: Payer: Self-pay | Admitting: Nurse Practitioner

## 2015-02-26 LAB — NMR, LIPOPROFILE
Cholesterol: 122 mg/dL (ref 100–199)
HDL Cholesterol by NMR: 45 mg/dL (ref >39)
HDL Particle Number: 42 umol/L (ref >=30.5)
LDL PARTICLE NUMBER: 889 nmol/L (ref <1000)
LDL SIZE: 19.8 nm (ref >20.5)
LDL-C: 59 mg/dL (ref 0–99)
LP-IR Score: 52 — ABNORMAL HIGH (ref <=45)
Small LDL Particle Number: 599 nmol/L — ABNORMAL HIGH (ref <=527)
TRIGLYCERIDES BY NMR: 88 mg/dL (ref 0–149)

## 2015-02-26 LAB — CMP14+EGFR
ALT: 42 IU/L (ref 0–44)
AST: 32 IU/L (ref 0–40)
Albumin/Globulin Ratio: 2.1 (ref 1.1–2.5)
Albumin: 4.7 g/dL (ref 3.5–5.5)
Alkaline Phosphatase: 34 IU/L — ABNORMAL LOW (ref 39–117)
BILIRUBIN TOTAL: 0.5 mg/dL (ref 0.0–1.2)
BUN / CREAT RATIO: 30 — AB (ref 9–20)
BUN: 24 mg/dL (ref 6–24)
CO2: 22 mmol/L (ref 18–29)
Calcium: 9.8 mg/dL (ref 8.7–10.2)
Chloride: 101 mmol/L (ref 97–108)
Creatinine, Ser: 0.81 mg/dL (ref 0.76–1.27)
GFR calc non Af Amer: 100 mL/min/{1.73_m2} (ref >59)
GFR, EST AFRICAN AMERICAN: 116 mL/min/{1.73_m2} (ref >59)
Globulin, Total: 2.2 g/dL (ref 1.5–4.5)
Glucose: 165 mg/dL — ABNORMAL HIGH (ref 65–99)
POTASSIUM: 4.8 mmol/L (ref 3.5–5.2)
SODIUM: 141 mmol/L (ref 134–144)
Total Protein: 6.9 g/dL (ref 6.0–8.5)

## 2015-02-26 LAB — PSA, TOTAL AND FREE
PSA FREE PCT: 31.4 %
PSA FREE: 0.22 ng/mL
PSA: 0.7 ng/mL (ref 0.0–4.0)

## 2015-02-26 NOTE — Telephone Encounter (Signed)
Patient aware that cialsis was sent over electronically yesterday and the pharmacy has received the prescription.

## 2015-03-13 ENCOUNTER — Encounter: Payer: Self-pay | Admitting: *Deleted

## 2015-03-25 ENCOUNTER — Other Ambulatory Visit: Payer: Self-pay | Admitting: Nurse Practitioner

## 2015-04-14 ENCOUNTER — Other Ambulatory Visit: Payer: Self-pay | Admitting: Nurse Practitioner

## 2015-04-29 DIAGNOSIS — M5136 Other intervertebral disc degeneration, lumbar region: Secondary | ICD-10-CM | POA: Insufficient documentation

## 2015-05-13 ENCOUNTER — Other Ambulatory Visit: Payer: Self-pay | Admitting: Nurse Practitioner

## 2015-06-02 ENCOUNTER — Ambulatory Visit: Payer: 59 | Admitting: Nurse Practitioner

## 2015-06-13 ENCOUNTER — Other Ambulatory Visit: Payer: Self-pay | Admitting: Nurse Practitioner

## 2015-06-13 NOTE — Telephone Encounter (Signed)
Last sen 02/25/15 MMM If approved route to [facility]

## 2015-06-13 NOTE — Telephone Encounter (Signed)
Please call in ambien  with 0 refills 

## 2015-06-13 NOTE — Telephone Encounter (Signed)
rx called into pharmacy

## 2015-06-26 ENCOUNTER — Ambulatory Visit (INDEPENDENT_AMBULATORY_CARE_PROVIDER_SITE_OTHER): Payer: Commercial Managed Care - HMO | Admitting: Nurse Practitioner

## 2015-06-26 ENCOUNTER — Encounter (INDEPENDENT_AMBULATORY_CARE_PROVIDER_SITE_OTHER): Payer: Self-pay

## 2015-06-26 ENCOUNTER — Encounter: Payer: Self-pay | Admitting: Nurse Practitioner

## 2015-06-26 VITALS — BP 117/80 | HR 65 | Temp 97.8°F | Ht 70.0 in | Wt 235.0 lb

## 2015-06-26 DIAGNOSIS — I10 Essential (primary) hypertension: Secondary | ICD-10-CM | POA: Diagnosis not present

## 2015-06-26 DIAGNOSIS — G4700 Insomnia, unspecified: Secondary | ICD-10-CM | POA: Diagnosis not present

## 2015-06-26 DIAGNOSIS — J301 Allergic rhinitis due to pollen: Secondary | ICD-10-CM

## 2015-06-26 DIAGNOSIS — K219 Gastro-esophageal reflux disease without esophagitis: Secondary | ICD-10-CM | POA: Diagnosis not present

## 2015-06-26 DIAGNOSIS — M549 Dorsalgia, unspecified: Secondary | ICD-10-CM

## 2015-06-26 DIAGNOSIS — E785 Hyperlipidemia, unspecified: Secondary | ICD-10-CM

## 2015-06-26 DIAGNOSIS — G8929 Other chronic pain: Secondary | ICD-10-CM

## 2015-06-26 DIAGNOSIS — E119 Type 2 diabetes mellitus without complications: Secondary | ICD-10-CM | POA: Diagnosis not present

## 2015-06-26 DIAGNOSIS — F329 Major depressive disorder, single episode, unspecified: Secondary | ICD-10-CM

## 2015-06-26 DIAGNOSIS — F32A Depression, unspecified: Secondary | ICD-10-CM

## 2015-06-26 LAB — POCT GLYCOSYLATED HEMOGLOBIN (HGB A1C): HEMOGLOBIN A1C: 8

## 2015-06-26 MED ORDER — METFORMIN HCL 1000 MG PO TABS: 1000.0000 mg | ORAL_TABLET | Freq: Two times a day (BID) | ORAL | Status: DC

## 2015-06-26 MED ORDER — LORATADINE 10 MG PO TABS: 10.0000 mg | ORAL_TABLET | Freq: Every day | ORAL | Status: DC

## 2015-06-26 MED ORDER — SERTRALINE HCL 100 MG PO TABS: ORAL_TABLET | ORAL | Status: DC

## 2015-06-26 MED ORDER — EZETIMIBE 10 MG PO TABS: 10.0000 mg | ORAL_TABLET | Freq: Every day | ORAL | Status: DC

## 2015-06-26 MED ORDER — SIMVASTATIN 40 MG PO TABS: 40.0000 mg | ORAL_TABLET | Freq: Every day | ORAL | Status: DC

## 2015-06-26 MED ORDER — GLUCOSE BLOOD VI STRP: ORAL_STRIP | Status: DC

## 2015-06-26 MED ORDER — OXYCODONE-ACETAMINOPHEN 10-325 MG PO TABS: ORAL_TABLET | ORAL | Status: DC

## 2015-06-26 MED ORDER — OLMESARTAN MEDOXOMIL-HCTZ 40-25 MG PO TABS: ORAL_TABLET | ORAL | Status: DC

## 2015-06-26 NOTE — Patient Instructions (Signed)

## 2015-06-26 NOTE — Addendum Note (Signed)
Addended by: Chevis Pretty on: 06/26/2015 12:51 PM   Modules accepted: Orders

## 2015-06-26 NOTE — Progress Notes (Addendum)
Subjective:    Patient ID: [name], male    DOB: 1961-04-03, 54 y.o.   MRN: [id number]   Patient here today for follow up of chronic medical problems.  Hyperglycemia This is a chronic problem. The current episode started more than 1 year ago. The problem has been waxing and waning. Associated symptoms include diaphoresis (Some night sweats recently ). Pertinent negatives include no chest pain, chills, headaches, nausea or neck pain. Associated symptoms comments: Tingling in bilateral feet. . Nothing aggravates the symptoms. He has tried rest, position changes, lying down and walking for the symptoms. The treatment provided significant relief.  Hypertension This is a chronic problem. The current episode started more than 1 year ago. The problem is unchanged. The problem is controlled. Pertinent negatives include no chest pain, headaches, neck pain, palpitations, peripheral edema or shortness of breath. There are no associated agents to hypertension. Risk factors for coronary artery disease include dyslipidemia, diabetes mellitus, male gender and obesity. Past treatments include angiotensin blockers and diuretics. The current treatment provides significant improvement. Compliance problems include diet and exercise.   Hyperlipidemia This is a chronic problem. The current episode started more than 1 year ago. The problem is uncontrolled. Recent lipid tests were reviewed and are high. Exacerbating diseases include diabetes and obesity. He has no history of hypothyroidism. Factors aggravating his hyperlipidemia include thiazides. Pertinent negatives include no chest pain or shortness of breath. Current antihyperlipidemic treatment includes statins and ezetimibe. The current treatment provides moderate improvement of lipids. Compliance problems include adherence to diet and adherence to exercise.  Risk factors for coronary artery disease include dyslipidemia, family history, hypertension and male sex.    Diabetes He presents for his follow-up diabetic visit. He has type 2 diabetes mellitus. No MedicAlert identification noted. There are no hypoglycemic associated symptoms. Pertinent negatives for hypoglycemia include no headaches. Associated symptoms include foot paresthesias (bilateral foot tingling last month). Pertinent negatives for diabetes include no chest pain. There are no hypoglycemic complications. Risk factors for coronary artery disease include dyslipidemia, diabetes mellitus, hypertension, male sex and obesity. Current diabetic treatment includes oral agent (monotherapy). He is compliant with treatment most of the time. His weight is stable. When asked about meal planning, he reported none. He has not had a previous visit with a dietitian. His breakfast blood glucose is taken between 8-9 am. His breakfast blood glucose range is generally 130-140 mg/dl. His overall blood glucose range is 130-140 mg/dl. (Does not check very often) An ACE inhibitor/angiotensin II receptor blocker is being taken. He does not see a podiatrist.Eye exam is not current.  GERD nexium works well- no symptoms when takes med Depression He reports he weaned himself off of the Zoloft-last dose 01/09/2015 and has been feeling well since. Feels very positive about his situation.  insomnia Lorrin Mais helps him rest well without side effects Back pain He states he always has baseline back pain, but will only take valium and pain medications after increased activity and pain.  BPH flomax was helping with his symptoms but does not seem as effective in the last month.     Review of Systems  Constitutional: Positive for diaphoresis (Some night sweats recently ). Negative for chills and unexpected weight change.  Eyes: Negative for visual disturbance.  Respiratory: Negative for chest tightness and shortness of breath.   Cardiovascular: Negative for chest pain and palpitations.  Gastrointestinal: Negative for nausea.   Musculoskeletal: Negative for neck pain.  Neurological: Negative for headaches.  All other systems  reviewed and are negative.      Objective:   Physical Exam  Constitutional: He is oriented to person, place, and time. He appears well-developed and well-nourished.  HENT:  Head: Normocephalic.  Right Ear: External ear normal.  Left Ear: External ear normal.  Nose: Nose normal.  Mouth/Throat: Oropharynx is clear and moist.  Eyes: EOM are normal. Pupils are equal, round, and reactive to light.  Neck: Normal range of motion. Neck supple. No JVD present. No thyromegaly present.  Cardiovascular: Normal rate, regular rhythm, normal heart sounds and intact distal pulses.  Exam reveals no gallop and no friction rub.   No murmur heard. Pulmonary/Chest: Effort normal and breath sounds normal. No respiratory distress. He has no wheezes. He has no rales. He exhibits no tenderness.  Abdominal: Soft. Bowel sounds are normal. He exhibits no mass. There is no tenderness.  Genitourinary:  Prostate exam deferred to urology  Musculoskeletal: Normal range of motion. He exhibits no edema.  Lymphadenopathy:    He has no cervical adenopathy.  Neurological: He is alert and oriented to person, place, and time. No cranial nerve deficit.  Skin: Skin is warm and dry.  Psychiatric: He has a normal mood and affect. His behavior is normal. Judgment and thought content normal.   BP 117/80 mmHg  Pulse 65  Temp(Src) 97.8 F (36.6 C) (Oral)  Ht '5\' 10"'  (1.778 m)  Wt 235 lb (106.595 kg)  BMI 33.72 kg/m2         Assessment & Plan:  1. Essential hypertension Do not add salt to diet - CMP14+EGFR - olmesartan-hydrochlorothiazide (BENICAR HCT) 40-25 MG per tablet; TAKE ONE (1) TABLET EACH DAY  Dispense: 30 tablet; Refill: 5  2. Type 2 diabetes mellitus without complication Carb counting Increased metformin to BID - POCT glycosylated hemoglobin (Hb A1C) - metFORMIN (GLUCOPHAGE) 1000 MG tablet; Take 1  tablet (1,000 mg total) by mouth 2 (two) times daily with a meal.  Dispense: 60 tablet; Refill: 5 - glucose blood (ONE TOUCH ULTRA TEST) test strip; Test 1 x per day and prn-- dx 250.01  Dispense: 100 each; Refill: 12  3. Hyperlipidemia with target LDL less than 100 Low fat diet - Lipid panel - simvastatin (ZOCOR) 40 MG tablet; Take 1 tablet (40 mg total) by mouth at bedtime.  Dispense: 30 tablet; Refill: 5 - ezetimibe (ZETIA) 10 MG tablet; Take 1 tablet (10 mg total) by mouth daily.  Dispense: 30 tablet; Refill: 5  4. Allergic rhinitis due to pollen Avoid allergens  5. Gastroesophageal reflux disease without esophagitis Avoid spicy foods Do not eat 2 hours prior to bedtime   6. Insomnia Bedtime ritual  7. Depression Stress management - sertraline (ZOLOFT) 100 MG tablet; 2 po qd  Dispense: 60 tablet; Refill: 3  8. Chronic back pain Back exercises    Labs pending Health maintenance reviewed Diet and exercise encouraged Continue all meds Follow up  In 3 month   [place], FNP

## 2015-06-27 LAB — CMP14+EGFR
A/G RATIO: 2.4 (ref 1.1–2.5)
ALK PHOS: 40 IU/L (ref 39–117)
ALT: 32 IU/L (ref 0–44)
AST: 34 IU/L (ref 0–40)
Albumin: 5 g/dL (ref 3.5–5.5)
BILIRUBIN TOTAL: 0.4 mg/dL (ref 0.0–1.2)
BUN / CREAT RATIO: 24 — AB (ref 9–20)
BUN: 22 mg/dL (ref 6–24)
CHLORIDE: 101 mmol/L (ref 97–108)
CO2: 21 mmol/L (ref 18–29)
Calcium: 9.7 mg/dL (ref 8.7–10.2)
Creatinine, Ser: 0.92 mg/dL (ref 0.76–1.27)
GFR calc Af Amer: 107 mL/min/{1.73_m2} (ref >59)
GFR, EST NON AFRICAN AMERICAN: 93 mL/min/{1.73_m2} (ref >59)
GLUCOSE: 173 mg/dL — AB (ref 65–99)
Globulin, Total: 2.1 g/dL (ref 1.5–4.5)
Potassium: 4.7 mmol/L (ref 3.5–5.2)
SODIUM: 141 mmol/L (ref 134–144)
Total Protein: 7.1 g/dL (ref 6.0–8.5)

## 2015-06-27 LAB — LIPID PANEL
Chol/HDL Ratio: 2.7 ratio units (ref 0.0–5.0)
Cholesterol, Total: 110 mg/dL (ref 100–199)
HDL: 41 mg/dL (ref >39)
LDL Calculated: 46 mg/dL (ref 0–99)
TRIGLYCERIDES: 115 mg/dL (ref 0–149)
VLDL Cholesterol Cal: 23 mg/dL (ref 5–40)

## 2015-06-30 ENCOUNTER — Encounter: Payer: Self-pay | Admitting: *Deleted

## 2015-08-06 ENCOUNTER — Other Ambulatory Visit: Payer: Self-pay | Admitting: Nurse Practitioner

## 2015-08-06 NOTE — Telephone Encounter (Signed)
Last seen 06/26/15  MMM If approved route to nurse to call [facility]

## 2015-08-07 NOTE — Telephone Encounter (Signed)
Please call in ambien with 1 refills 

## 2015-08-19 ENCOUNTER — Other Ambulatory Visit: Payer: Self-pay | Admitting: Nurse Practitioner

## 2015-09-29 ENCOUNTER — Ambulatory Visit: Payer: Commercial Managed Care - HMO | Admitting: Nurse Practitioner

## 2015-09-30 ENCOUNTER — Encounter: Payer: Self-pay | Admitting: Nurse Practitioner

## 2015-10-01 ENCOUNTER — Other Ambulatory Visit: Payer: Self-pay | Admitting: Nurse Practitioner

## 2015-10-01 NOTE — Telephone Encounter (Signed)
Last seen 06/26/15  MMM If approved route to nurse to call into  [facility] 

## 2015-10-02 NOTE — Telephone Encounter (Signed)
rx called to pharmacy 

## 2015-10-02 NOTE — Telephone Encounter (Signed)
Please call in ambien with 1 refills 

## 2015-10-27 ENCOUNTER — Other Ambulatory Visit: Payer: Self-pay | Admitting: Nurse Practitioner

## 2015-11-20 ENCOUNTER — Encounter: Payer: Self-pay | Admitting: Nurse Practitioner

## 2015-11-20 ENCOUNTER — Ambulatory Visit (INDEPENDENT_AMBULATORY_CARE_PROVIDER_SITE_OTHER): Payer: Commercial Managed Care - HMO | Admitting: Nurse Practitioner

## 2015-11-20 VITALS — BP 123/82 | HR 96 | Temp 97.6°F | Ht 70.0 in | Wt 231.0 lb

## 2015-11-20 DIAGNOSIS — E785 Hyperlipidemia, unspecified: Secondary | ICD-10-CM | POA: Diagnosis not present

## 2015-11-20 DIAGNOSIS — M48062 Spinal stenosis, lumbar region with neurogenic claudication: Secondary | ICD-10-CM

## 2015-11-20 DIAGNOSIS — M549 Dorsalgia, unspecified: Secondary | ICD-10-CM

## 2015-11-20 DIAGNOSIS — E119 Type 2 diabetes mellitus without complications: Secondary | ICD-10-CM | POA: Diagnosis not present

## 2015-11-20 DIAGNOSIS — I10 Essential (primary) hypertension: Secondary | ICD-10-CM

## 2015-11-20 DIAGNOSIS — G4700 Insomnia, unspecified: Secondary | ICD-10-CM

## 2015-11-20 DIAGNOSIS — K219 Gastro-esophageal reflux disease without esophagitis: Secondary | ICD-10-CM

## 2015-11-20 DIAGNOSIS — M4806 Spinal stenosis, lumbar region: Secondary | ICD-10-CM | POA: Diagnosis not present

## 2015-11-20 DIAGNOSIS — Z23 Encounter for immunization: Secondary | ICD-10-CM

## 2015-11-20 DIAGNOSIS — Z1159 Encounter for screening for other viral diseases: Secondary | ICD-10-CM

## 2015-11-20 DIAGNOSIS — F329 Major depressive disorder, single episode, unspecified: Secondary | ICD-10-CM

## 2015-11-20 DIAGNOSIS — Z1212 Encounter for screening for malignant neoplasm of rectum: Secondary | ICD-10-CM | POA: Diagnosis not present

## 2015-11-20 DIAGNOSIS — G8929 Other chronic pain: Secondary | ICD-10-CM | POA: Diagnosis not present

## 2015-11-20 DIAGNOSIS — G629 Polyneuropathy, unspecified: Secondary | ICD-10-CM | POA: Diagnosis not present

## 2015-11-20 DIAGNOSIS — F32A Depression, unspecified: Secondary | ICD-10-CM

## 2015-11-20 LAB — POCT GLYCOSYLATED HEMOGLOBIN (HGB A1C): Hemoglobin A1C: 6.6

## 2015-11-20 MED ORDER — GABAPENTIN 300 MG PO CAPS: 300.0000 mg | ORAL_CAPSULE | Freq: Every day | ORAL | Status: DC

## 2015-11-20 MED ORDER — SERTRALINE HCL 100 MG PO TABS: ORAL_TABLET | ORAL | Status: DC

## 2015-11-20 MED ORDER — ZOLPIDEM TARTRATE 10 MG PO TABS: 10.0000 mg | ORAL_TABLET | Freq: Every evening | ORAL | Status: DC | PRN

## 2015-11-20 MED ORDER — METFORMIN HCL 1000 MG PO TABS: 1000.0000 mg | ORAL_TABLET | Freq: Two times a day (BID) | ORAL | Status: DC

## 2015-11-20 MED ORDER — SIMVASTATIN 40 MG PO TABS: 40.0000 mg | ORAL_TABLET | Freq: Every day | ORAL | Status: DC

## 2015-11-20 MED ORDER — OLMESARTAN MEDOXOMIL-HCTZ 40-25 MG PO TABS: ORAL_TABLET | ORAL | Status: DC

## 2015-11-20 MED ORDER — EZETIMIBE 10 MG PO TABS: 10.0000 mg | ORAL_TABLET | Freq: Every day | ORAL | Status: DC

## 2015-11-20 NOTE — Progress Notes (Signed)
Subjective:    Patient ID: [name], male    DOB: 07-Jan-1961, 54 y.o.   MRN: [id number]   Patient here today for follow up of chronic medical problems.  C/O burning and stinging in bil feet- worse at night.  Hyperglycemia This is a chronic problem. The current episode started more than 1 year ago. The problem has been waxing and waning. Associated symptoms include diaphoresis (Some night sweats recently ). Pertinent negatives include no chest pain, chills, headaches, nausea or neck pain. Associated symptoms comments: Tingling in bilateral feet. . Nothing aggravates the symptoms. He has tried rest, position changes, lying down and walking for the symptoms. The treatment provided significant relief.  Hypertension This is a chronic problem. The current episode started more than 1 year ago. The problem is unchanged. The problem is controlled. Pertinent negatives include no chest pain, headaches, neck pain, palpitations, peripheral edema or shortness of breath. There are no associated agents to hypertension. Risk factors for coronary artery disease include dyslipidemia, diabetes mellitus, male gender and obesity. Past treatments include angiotensin blockers and diuretics. The current treatment provides significant improvement. Compliance problems include diet and exercise.   Hyperlipidemia This is a chronic problem. The current episode started more than 1 year ago. The problem is uncontrolled. Recent lipid tests were reviewed and are high. Exacerbating diseases include diabetes and obesity. He has no history of hypothyroidism. Factors aggravating his hyperlipidemia include thiazides. Pertinent negatives include no chest pain or shortness of breath. Current antihyperlipidemic treatment includes statins and ezetimibe. The current treatment provides moderate improvement of lipids. Compliance problems include adherence to diet and adherence to exercise.  Risk factors for coronary artery disease include  dyslipidemia, family history, hypertension and male sex.  Diabetes He presents for his follow-up diabetic visit. He has type 2 diabetes mellitus. No MedicAlert identification noted. There are no hypoglycemic associated symptoms. Pertinent negatives for hypoglycemia include no headaches. Associated symptoms include foot paresthesias (bilateral foot tingling last month). Pertinent negatives for diabetes include no chest pain. There are no hypoglycemic complications. Risk factors for coronary artery disease include dyslipidemia, diabetes mellitus, hypertension, male sex and obesity. Current diabetic treatment includes oral agent (monotherapy). He is compliant with treatment most of the time. His weight is stable. When asked about meal planning, he reported none. He has not had a previous visit with a dietitian. His breakfast blood glucose is taken between 8-9 am. His breakfast blood glucose range is generally 130-140 mg/dl. His overall blood glucose range is 130-140 mg/dl. (Does not check very often) An ACE inhibitor/angiotensin II receptor blocker is being taken. He does not see a podiatrist.Eye exam is not current.  GERD nexium works well- no symptoms when takes med Depression He reports he weaned himself off of the Zoloft-last dose 01/09/2015 and has been feeling well since. Feels very positive about his situation.  insomnia Lorrin Mais helps him rest well without side effects Back pain He states he always has baseline back pain, but will only take valium and pain medications after increased activity and pain.  BPH flomax was helping with his symptoms but does not seem as effective in the last month.  ED cialis works well without side effects  Review of Systems  Constitutional: Positive for diaphoresis (Some night sweats recently ). Negative for chills and unexpected weight change.  Eyes: Negative for visual disturbance.  Respiratory: Negative for chest tightness and shortness of breath.     Cardiovascular: Negative for chest pain and palpitations.  Gastrointestinal: Negative for  nausea.  Musculoskeletal: Negative for neck pain.  Neurological: Negative for headaches.  All other systems reviewed and are negative.      Objective:   Physical Exam  Constitutional: He is oriented to person, place, and time. He appears well-developed and well-nourished.  HENT:  Head: Normocephalic.  Right Ear: External ear normal.  Left Ear: External ear normal.  Nose: Nose normal.  Mouth/Throat: Oropharynx is clear and moist.  Eyes: EOM are normal. Pupils are equal, round, and reactive to light.  Neck: Normal range of motion. Neck supple. No JVD present. No thyromegaly present.  Cardiovascular: Normal rate, regular rhythm, normal heart sounds and intact distal pulses.  Exam reveals no gallop and no friction rub.   No murmur heard. Pulmonary/Chest: Effort normal and breath sounds normal. No respiratory distress. He has no wheezes. He has no rales. He exhibits no tenderness.  Abdominal: Soft. Bowel sounds are normal. He exhibits no mass. There is no tenderness.  Genitourinary:  Prostate exam deferred to urology  Musculoskeletal: Normal range of motion. He exhibits no edema.  Lymphadenopathy:    He has no cervical adenopathy.  Neurological: He is alert and oriented to person, place, and time. No cranial nerve deficit.  Skin: Skin is warm and dry.  Psychiatric: He has a normal mood and affect. His behavior is normal. Judgment and thought content normal.   BP 123/82 mmHg  Pulse 96  Temp(Src) 97.6 F (36.4 C) (Oral)  Ht '5\' 10"'  (1.778 m)  Wt 231 lb (104.781 kg)  BMI 33.15 kg/m2  Results for orders placed or performed in visit on 11/20/15  POCT glycosylated hemoglobin (Hb A1C)  Result Value Ref Range   Hemoglobin A1C 6.6           Assessment & Plan:  1. Essential hypertension Do not add salt to diet - CMP14+EGFR - olmesartan-hydrochlorothiazide (BENICAR HCT) 40-25 MG per  tablet; TAKE ONE (1) TABLET EACH DAY  Dispense: 30 tablet; Refill: 5  2. Type 2 diabetes mellitus without complication Carb counting Increased metformin to BID - POCT glycosylated hemoglobin (Hb A1C) - metFORMIN (GLUCOPHAGE) 1000 MG tablet; Take 1 tablet (1,000 mg total) by mouth 2 (two) times daily with a meal.  Dispense: 60 tablet; Refill: 5 - glucose blood (ONE TOUCH ULTRA TEST) test strip; Test 1 x per day and prn-- dx 250.01  Dispense: 100 each; Refill: 12  3. Hyperlipidemia with target LDL less than 100 Low fat diet - Lipid panel - simvastatin (ZOCOR) 40 MG tablet; Take 1 tablet (40 mg total) by mouth at bedtime.  Dispense: 30 tablet; Refill: 5 - ezetimibe (ZETIA) 10 MG tablet; Take 1 tablet (10 mg total) by mouth daily.  Dispense: 30 tablet; Refill: 5  4. Allergic rhinitis due to pollen Avoid allergens  5. Gastroesophageal reflux disease without esophagitis Avoid spicy foods Do not eat 2 hours prior to bedtime   6. Insomnia Bedtime ritual  7. Depression Stress management - sertraline (ZOLOFT) 100 MG tablet; 2 po qd  Dispense: 60 tablet; Refill: 3  8. Chronic back pain Back exercises  9. Neuropathy Do not go barefooted -gabapentin 313m Qhs #30 5 refills   hemoccult cards given to patient with directions Labs pending Health maintenance reviewed Diet and exercise encouraged Continue all meds Follow up  In 3 month   MDrayton FNP

## 2015-11-20 NOTE — Patient Instructions (Signed)

## 2015-11-21 LAB — LIPID PANEL
CHOLESTEROL TOTAL: 122 mg/dL (ref 100–199)
Chol/HDL Ratio: 3.5 ratio units (ref 0.0–5.0)
HDL: 35 mg/dL — ABNORMAL LOW (ref >39)
LDL CALC: 39 mg/dL (ref 0–99)
Triglycerides: 238 mg/dL — ABNORMAL HIGH (ref 0–149)
VLDL Cholesterol Cal: 48 mg/dL — ABNORMAL HIGH (ref 5–40)

## 2015-11-21 LAB — CMP14+EGFR
ALBUMIN: 4.8 g/dL (ref 3.5–5.5)
ALT: 31 IU/L (ref 0–44)
AST: 27 IU/L (ref 0–40)
Albumin/Globulin Ratio: 2.5 (ref 1.1–2.5)
Alkaline Phosphatase: 33 IU/L — ABNORMAL LOW (ref 39–117)
BUN/Creatinine Ratio: 21 — ABNORMAL HIGH (ref 9–20)
BUN: 19 mg/dL (ref 6–24)
Bilirubin Total: 0.5 mg/dL (ref 0.0–1.2)
CALCIUM: 9.6 mg/dL (ref 8.7–10.2)
CO2: 23 mmol/L (ref 18–29)
CREATININE: 0.92 mg/dL (ref 0.76–1.27)
Chloride: 101 mmol/L (ref 97–106)
GFR calc Af Amer: 107 mL/min/{1.73_m2} (ref >59)
GFR, EST NON AFRICAN AMERICAN: 93 mL/min/{1.73_m2} (ref >59)
GLOBULIN, TOTAL: 1.9 g/dL (ref 1.5–4.5)
GLUCOSE: 130 mg/dL — AB (ref 65–99)
Potassium: 4.6 mmol/L (ref 3.5–5.2)
SODIUM: 143 mmol/L (ref 136–144)
Total Protein: 6.7 g/dL (ref 6.0–8.5)

## 2015-11-21 LAB — MICROALBUMIN / CREATININE URINE RATIO
CREATININE, UR: 251.5 mg/dL
MICROALB/CREAT RATIO: 4.4 mg/g{creat} (ref 0.0–30.0)
Microalbumin, Urine: 11.1 ug/mL

## 2015-11-21 LAB — HEPATITIS C ANTIBODY: Hep C Virus Ab: 0.1 s/co ratio (ref 0.0–0.9)

## 2015-12-17 ENCOUNTER — Other Ambulatory Visit: Payer: Self-pay | Admitting: Nurse Practitioner

## 2015-12-18 ENCOUNTER — Encounter: Payer: Self-pay | Admitting: Urology

## 2015-12-18 ENCOUNTER — Ambulatory Visit (INDEPENDENT_AMBULATORY_CARE_PROVIDER_SITE_OTHER): Payer: Commercial Managed Care - HMO | Admitting: Urology

## 2015-12-18 VITALS — BP 126/81 | HR 90 | Ht 70.0 in | Wt 228.3 lb

## 2015-12-18 DIAGNOSIS — Z8639 Personal history of other endocrine, nutritional and metabolic disease: Secondary | ICD-10-CM | POA: Diagnosis not present

## 2015-12-18 DIAGNOSIS — Z87442 Personal history of urinary calculi: Secondary | ICD-10-CM | POA: Diagnosis not present

## 2015-12-18 DIAGNOSIS — N528 Other male erectile dysfunction: Secondary | ICD-10-CM

## 2015-12-18 DIAGNOSIS — N509 Disorder of male genital organs, unspecified: Secondary | ICD-10-CM | POA: Diagnosis not present

## 2015-12-18 DIAGNOSIS — N5089 Other specified disorders of the male genital organs: Secondary | ICD-10-CM

## 2015-12-18 DIAGNOSIS — N401 Enlarged prostate with lower urinary tract symptoms: Secondary | ICD-10-CM

## 2015-12-18 DIAGNOSIS — N138 Other obstructive and reflux uropathy: Secondary | ICD-10-CM

## 2015-12-18 DIAGNOSIS — N529 Male erectile dysfunction, unspecified: Secondary | ICD-10-CM

## 2015-12-18 MED ORDER — TADALAFIL 5 MG PO TABS: 5.0000 mg | ORAL_TABLET | Freq: Every day | ORAL | Status: DC | PRN

## 2015-12-18 NOTE — Progress Notes (Signed)
12/18/2015 3:29 PM   [name] 1961/07/16 [id number]  Referring provider: Chevis Pretty, [place], [place] [id number]  Chief Complaint  Patient presents with  . Benign Prostatic Hypertrophy    1year follow up w/PSA    HPI: Patient is a 55 year old Caucasian male with a remote history of hypogonadism and nephrolithiasis, BPH with LUTS, erectile dysfunction and the new discovery of a scrotal mass who presents today for 1 year follow-up.  History of hypogonadism Patient underwent his last Testopel insertion in 2011. He does not currently have symptoms of hypogonadism.  History of nephrolithiasis Patient underwent URS in 2011. He has not had any episodes of renal colic or gross hematuria.  BPH WITH LUTS His IPSS score today is 7, which is mild lower urinary tract symptomatology. He is pleased with his quality life due to his urinary symptoms. He denies any dysuria, hematuria or suprapubic pain.  He currently taking tamsulosin 0.4 mg daily.  He also denies any recent fevers, chills, nausea or vomiting.  He does not have a family history of PCa.      IPSS      12/18/15 1100       International Prostate Symptom Score   How often have you had the sensation of not emptying your bladder? Less than 1 in 5     How often have you had to urinate less than every two hours? Less than half the time     How often have you found you stopped and started again several times when you urinated? Less than 1 in 5 times     How often have you found it difficult to postpone urination? Less than 1 in 5 times     How often have you had a weak urinary stream? Less than 1 in 5 times     How often have you had to strain to start urination? Not at All     How many times did you typically get up at night to urinate? 1 Time     Total IPSS Score 7     Quality of Life due to urinary symptoms   If you were to spend the rest of your life with your urinary condition just the way  it is now how would you feel about that? Pleased        Score:  1-7 Mild 8-19 Moderate 20-35 Severe  Erectile dysfunction He has been having difficulty with erections for over 3 years.   His major complaint is maintaining an erection.  His libido is preserved.   His risk factors for ED are DM, HTN, neuropathy, depression and hyperlipidemia.  He denies any painful erections or curvatures with his erections.   He has tried Cialis in the past with good results.  Scrotal mass Patient discover the area of concern 3 weeks ago in his left testicle.  It has reduced in size since he first noticed the mass. He has not had hematuria, difficulty with urination or difficulty with ejaculation.  Nor have there been any fevers, chills, nausea or vomiting.  He describes an achy pain associated with the mass.    PMH: Past Medical History  Diagnosis Date  . Hypertension     takes Benicar daily  . Hyperlipidemia     takes Zetia daily  . Pneumonia 1986    walking  . Headache(784.0)     occasionally  . Dizziness   . Arthritis   . Joint pain   .  Chronic back pain     stenosis  . Constipation     with pain meds  . GERD (gastroesophageal reflux disease)     takes Nexium daily  . Urinary frequency     takes Flomax daily  . Urinary urgency   . Diabetes mellitus without complication ([place])     takes Metformni daily  . Depression     takes Zoloft daily  . Insomnia     takes Ambien nightly  . Seasonal allergies     takes Claritin daily  . Tear of medial meniscus of right knee   . Impingement syndrome of left shoulder   . Left rotator cuff tear     Surgical History: Past Surgical History  Procedure Laterality Date  . Nasal septum surgery    . Lithotripsy    . Removal of kidney stones  1981    lt open removal  . Left elbow surgery    . Colonoscopy    . Back surgery  [id number]    lumb fusion  . Tonsillectomy    . Knee arthroscopy with medial menisectomy Right 09/26/2013    Procedure:  RIGHT KNEE ARTHROSCOPY WITH PARTIAL MEDIAL and lateral chrondroplasty MENISECTOMY;  Surgeon: Lorn Junes, MD;  Location: [facility];  Service: Orthopedics;  Laterality: Right;  . Shoulder arthroscopy with rotator cuff repair and subacromial decompression Left 09/26/2013    Procedure: LEFT SHOULDER ARTHROSCOPY WITH DEBRIDEMENT, PARTIAL ROTATOR CUFF REPAIR AND SUBACROMIAL DECOMPRESSION AND SAD ACD DISTAL CLAVICULECTOMY;  Surgeon: Lorn Junes, MD;  Location: [facility];  Service: Orthopedics;  Laterality: Left;    Home Medications:    Medication List       This list is accurate as of: 12/18/15 11:59 PM.  Always use your most recent med list.               diazepam 5 MG tablet  Commonly known as:  VALIUM  Take 1-2 tablets (5-10 mg total) by mouth every 6 (six) hours as needed.     esomeprazole 40 MG capsule  Commonly known as:  NEXIUM  TAKE ONE (1) CAPSULE EACH DAY     ezetimibe 10 MG tablet  Commonly known as:  ZETIA  Take 1 tablet (10 mg total) by mouth daily.     fish oil-omega-3 fatty acids 1000 MG capsule  Take 1 g by mouth 2 (two) times daily.     fluticasone 50 MCG/ACT nasal spray  Commonly known as:  FLONASE  USE 1 SPRAY IN EACH NOSTRIL ONCE DAILY     gabapentin 300 MG capsule  Commonly known as:  NEURONTIN  Take 1 capsule (300 mg total) by mouth at bedtime.     glucose blood test strip  Commonly known as:  ONE TOUCH ULTRA TEST  Test 1 x per day and prn-- dx 250.01     hydrocortisone 2.5 % rectal cream  Commonly known as:  ANUSOL-HC  Place 1 application rectally 2 (two) times daily.     loratadine 10 MG tablet  Commonly known as:  CLARITIN  TAKE ONE (1) TABLET EACH DAY     meloxicam 15 MG tablet  Commonly known as:  MOBIC     metFORMIN 1000 MG tablet  Commonly known as:  GLUCOPHAGE  Take 1 tablet (1,000 mg total) by mouth 2 (two) times daily with a meal.     multivitamin with minerals Tabs tablet  Take 1 tablet by  mouth daily.  olmesartan-hydrochlorothiazide 40-25 MG tablet  Commonly known as:  BENICAR HCT  TAKE ONE (1) TABLET EACH DAY     olopatadine 0.1 % ophthalmic solution  Commonly known as:  PATANOL  INSTILL ONE DROP IN BOTH EYES TWICE DAILY     oxyCODONE-acetaminophen 10-325 MG tablet  Commonly known as:  PERCOCET  1-2 tablets every 4-6 hrs as needed for pain     sertraline 100 MG tablet  Commonly known as:  ZOLOFT  2 po qd     simvastatin 40 MG tablet  Commonly known as:  ZOCOR  Take 1 tablet (40 mg total) by mouth at bedtime.     tadalafil 5 MG tablet  Commonly known as:  CIALIS  Take 1 tablet (5 mg total) by mouth daily as needed for erectile dysfunction.     tamsulosin 0.4 MG Caps capsule  Commonly known as:  FLOMAX  TAKE ONE (1) CAPSULE EACH DAY     zolpidem 10 MG tablet  Commonly known as:  AMBIEN  Take 1 tablet (10 mg total) by mouth at bedtime as needed.        Allergies:  Allergies  Allergen Reactions  . Codeine     anxiety  . Lipitor [Atorvastatin]     Myalgia    Family History: Family History  Problem Relation Age of Onset  . COPD Mother   . Hypertension Father   . Alzheimer's disease Father   . Diabetes Maternal Grandmother     Social History:  reports that he has never smoked. He does not have any smokeless tobacco history on file. He reports that he drinks alcohol. He reports that he does not use illicit drugs.  ROS: UROLOGY Frequent Urination?: No Hard to postpone urination?: No Burning/pain with urination?: No Get up at night to urinate?: Yes Leakage of urine?: No Urine stream starts and stops?: No Trouble starting stream?: No Do you have to strain to urinate?: No Blood in urine?: No Urinary tract infection?: No Sexually transmitted disease?: No Injury to kidneys or bladder?: No Painful intercourse?: No Weak stream?: No Erection problems?: No Penile pain?: No  Gastrointestinal Nausea?: No Vomiting?:  No Indigestion/heartburn?: No Diarrhea?: No Constipation?: No  Constitutional Fever: No Night sweats?: No Weight loss?: No Fatigue?: No  Skin Skin rash/lesions?: No Itching?: No  Eyes Blurred vision?: No Double vision?: No  Ears/Nose/Throat Sore throat?: No Sinus problems?: No  Hematologic/Lymphatic Swollen glands?: No Easy bruising?: No  Cardiovascular Leg swelling?: No Chest pain?: No  Respiratory Cough?: No Shortness of breath?: No  Endocrine Excessive thirst?: No  Musculoskeletal Back pain?: Yes Joint pain?: No  Neurological Headaches?: Yes Dizziness?: No  Psychologic Depression?: No Anxiety?: No  Physical Exam: BP 126/81 mmHg  Pulse 90  Ht 5\' 10"  (1.778 m)  Wt 228 lb 4.8 oz (103.556 kg)  BMI 32.76 kg/m2  Constitutional: Well nourished. Alert and oriented, No acute distress. HEENT: [place] AT, moist mucus membranes. Trachea midline, no masses. Cardiovascular: No clubbing, cyanosis, or edema. Respiratory: Normal respiratory effort, no increased work of breathing. GI: Abdomen is soft, non tender, non distended, no abdominal masses. Liver and spleen not palpable.  No hernias appreciated.  Stool sample for occult testing is not indicated.   GU: No CVA tenderness.  No bladder fullness or masses.  Patient with circumcised phallus.  Urethral meatus is patent.  No penile discharge. No penile lesions or rashes. Scrotum without lesions, cysts, rashes and/or edema.  Testicles are located scrotally bilaterally. No masses are appreciated in  the testicles. Left and right epididymis are normal.  Left testicle with varicocele vs hydrocele. Rectal: Patient with  normal sphincter tone. Anus and perineum without scarring or rashes. No rectal masses are appreciated. Prostate is approximately 45 grams, no nodules are appreciated. Seminal vesicles are normal. Skin: No rashes, bruises or suspicious lesions. Lymph: No cervical or inguinal adenopathy. Neurologic: Grossly  intact, no focal deficits, moving all 4 extremities. Psychiatric: Normal mood and affect.  Laboratory Data: Lab Results  Component Value Date   WBC 8.8 12/22/2012   HGB 14.1 09/26/2013   HCT 42.0 12/22/2012   MCV 91.9 12/22/2012   PLT 246 12/22/2012    Lab Results  Component Value Date   CREATININE 0.92 11/20/2015    Lab Results  Component Value Date   PSA 1.0 12/18/2015   PSA 0.7 02/25/2015   Lab Results  Component Value Date   HGBA1C 6.6 11/20/2015    Assessment & Plan:    1. Scrotal mass:    Patient discovered a lump in his left scrotum 3 weeks ago. It has reduced in size since its discovery. On exam, it feels like a varicocele. I will obtain a scrotal ultrasound to evaluate the internal structures of the scrotum. He will return for the report.   2. BPH with obstruction/lower urinary tract symptoms:   IPSS score is 7/1.  His symptoms are controlled with tamsulosin 0.4 mg daily.  A refill is not needed at this time.  He will return in 1 year for I PSS score, PSA and exam.  - PSA  3. Erectile dysfunction:   Patient is having good results with Cialis 5 mg daily. He'll return in 1 year for SHIM score and exam.  4. History of nephrolithiasis:   No symptoms at this time.  5. History of hypogonadism:   No symptoms at this time.   Return for return for scrotal ultrasound report.  Zara Council, [facility] [address], [place], [place] [address] [phone]

## 2015-12-19 ENCOUNTER — Telehealth: Payer: Self-pay

## 2015-12-19 LAB — PSA: Prostate Specific Ag, Serum: 1 ng/mL (ref 0.0–4.0)

## 2015-12-19 NOTE — Telephone Encounter (Signed)
-----   Message from Nori Riis, PA-C sent at 12/19/2015  8:23 AM EST ----- Please tell the patient that his PSA is stable. He is to undergo scrotal ultrasound in the future and returned to discuss those results.

## 2015-12-19 NOTE — Telephone Encounter (Signed)
Spoke with pt in reference to PSA results and scrotal u/s. Pt voiced understanding.

## 2015-12-20 DIAGNOSIS — Z87442 Personal history of urinary calculi: Secondary | ICD-10-CM | POA: Insufficient documentation

## 2015-12-20 DIAGNOSIS — Z8639 Personal history of other endocrine, nutritional and metabolic disease: Secondary | ICD-10-CM | POA: Insufficient documentation

## 2015-12-20 DIAGNOSIS — N529 Male erectile dysfunction, unspecified: Secondary | ICD-10-CM | POA: Insufficient documentation

## 2015-12-20 DIAGNOSIS — N138 Other obstructive and reflux uropathy: Secondary | ICD-10-CM | POA: Insufficient documentation

## 2015-12-20 DIAGNOSIS — N401 Enlarged prostate with lower urinary tract symptoms: Secondary | ICD-10-CM

## 2015-12-29 ENCOUNTER — Ambulatory Visit
Admission: RE | Admit: 2015-12-29 | Discharge: 2015-12-29 | Disposition: A | Discharge status: Home / Self Care | Payer: Commercial Managed Care - HMO | Source: Ambulatory Visit | Attending: Urology | Admitting: Urology

## 2015-12-29 DIAGNOSIS — I861 Scrotal varices: Secondary | ICD-10-CM | POA: Diagnosis not present

## 2015-12-29 DIAGNOSIS — N433 Hydrocele, unspecified: Secondary | ICD-10-CM | POA: Insufficient documentation

## 2015-12-29 DIAGNOSIS — N5089 Other specified disorders of the male genital organs: Secondary | ICD-10-CM

## 2015-12-29 DIAGNOSIS — N509 Disorder of male genital organs, unspecified: Secondary | ICD-10-CM | POA: Diagnosis present

## 2016-01-01 ENCOUNTER — Other Ambulatory Visit: Payer: Self-pay | Admitting: Nurse Practitioner

## 2016-01-02 ENCOUNTER — Encounter: Payer: Self-pay | Admitting: Urology

## 2016-01-02 ENCOUNTER — Ambulatory Visit (INDEPENDENT_AMBULATORY_CARE_PROVIDER_SITE_OTHER): Payer: Commercial Managed Care - HMO | Admitting: Urology

## 2016-01-02 VITALS — BP 101/67 | HR 90 | Ht 70.0 in | Wt 228.6 lb

## 2016-01-02 DIAGNOSIS — I861 Scrotal varices: Secondary | ICD-10-CM

## 2016-01-02 DIAGNOSIS — N433 Hydrocele, unspecified: Secondary | ICD-10-CM

## 2016-01-02 NOTE — Progress Notes (Signed)
10:22 AM   [name] 1961-04-15 [id number]  Referring provider: Chevis Pretty, [place], [place] [id number]  Chief Complaint  Patient presents with  . Results    Scrotal US    HPI: Patient is a 55 year old Caucasian male with a remote history of hypogonadism and nephrolithiasis, BPH with LUTS, erectile dysfunction and the new discovery of a scrotal mass who presents today to discuss his scrotal ultrasound results.    Scrotal mass Patient discover the area of concern 3 weeks ago in his left testicle.  It has reduced in size since he first noticed the mass. He has not had hematuria, difficulty with urination or difficulty with ejaculation.  Nor have there been any fevers, chills, nausea or vomiting.  He describes an achy pain associated with the mass.  Scrotal ultrasound demonstrated a small right hydrocele and a mild left varicocele.  No testicular abnormality.  I reviewed the films with the patient.     PMH: Past Medical History  Diagnosis Date  . Hypertension     takes Benicar daily  . Hyperlipidemia     takes Zetia daily  . Pneumonia 1986    walking  . Headache(784.0)     occasionally  . Dizziness   . Arthritis   . Joint pain   . Chronic back pain     stenosis  . Constipation     with pain meds  . GERD (gastroesophageal reflux disease)     takes Nexium daily  . Urinary frequency     takes Flomax daily  . Urinary urgency   . Diabetes mellitus without complication ([facility])     takes Metformni daily  . Depression     takes Zoloft daily  . Insomnia     takes Ambien nightly  . Seasonal allergies     takes Claritin daily  . Tear of medial meniscus of right knee   . Impingement syndrome of left shoulder   . Left rotator cuff tear     Surgical History: Past Surgical History  Procedure Laterality Date  . Nasal septum surgery    . Lithotripsy    . Removal of kidney stones  1981    lt open removal  . Left elbow surgery    .  Colonoscopy    . Back surgery  [id number]    lumb fusion  . Tonsillectomy    . Knee arthroscopy with medial menisectomy Right 09/26/2013    Procedure: RIGHT KNEE ARTHROSCOPY WITH PARTIAL MEDIAL and lateral chrondroplasty MENISECTOMY;  Surgeon: Lorn Junes, MD;  Location: [facility];  Service: Orthopedics;  Laterality: Right;  . Shoulder arthroscopy with rotator cuff repair and subacromial decompression Left 09/26/2013    Procedure: LEFT SHOULDER ARTHROSCOPY WITH DEBRIDEMENT, PARTIAL ROTATOR CUFF REPAIR AND SUBACROMIAL DECOMPRESSION AND SAD ACD DISTAL CLAVICULECTOMY;  Surgeon: Lorn Junes, MD;  Location: [facility];  Service: Orthopedics;  Laterality: Left;    Home Medications:    Medication List       This list is accurate as of: 01/02/16 10:22 AM.  Always use your most recent med list.               diazepam 5 MG tablet  Commonly known as:  VALIUM  Take 1-2 tablets (5-10 mg total) by mouth every 6 (six) hours as needed.     esomeprazole 40 MG capsule  Commonly known as:  NEXIUM  TAKE ONE (1) CAPSULE EACH DAY  ezetimibe 10 MG tablet  Commonly known as:  ZETIA  Take 1 tablet (10 mg total) by mouth daily.     fish oil-omega-3 fatty acids 1000 MG capsule  Take 1 g by mouth 2 (two) times daily.     fluticasone 50 MCG/ACT nasal spray  Commonly known as:  FLONASE  USE 1 SPRAY IN EACH NOSTRIL ONCE DAILY     gabapentin 300 MG capsule  Commonly known as:  NEURONTIN  Take 1 capsule (300 mg total) by mouth at bedtime.     glucose blood test strip  Commonly known as:  ONE TOUCH ULTRA TEST  Test 1 x per day and prn-- dx 250.01     hydrocortisone 2.5 % rectal cream  Commonly known as:  ANUSOL-HC  Place 1 application rectally 2 (two) times daily.     loratadine 10 MG tablet  Commonly known as:  CLARITIN  TAKE ONE (1) TABLET EACH DAY     meloxicam 15 MG tablet  Commonly known as:  MOBIC  Reported on 01/02/2016     metFORMIN 1000 MG  tablet  Commonly known as:  GLUCOPHAGE  Take 1 tablet (1,000 mg total) by mouth 2 (two) times daily with a meal.     multivitamin with minerals Tabs tablet  Take 1 tablet by mouth daily.     olmesartan-hydrochlorothiazide 40-25 MG tablet  Commonly known as:  BENICAR HCT  TAKE ONE (1) TABLET EACH DAY     olopatadine 0.1 % ophthalmic solution  Commonly known as:  PATANOL  INSTILL ONE DROP IN BOTH EYES TWICE DAILY     oxyCODONE-acetaminophen 10-325 MG tablet  Commonly known as:  PERCOCET  1-2 tablets every 4-6 hrs as needed for pain     sertraline 100 MG tablet  Commonly known as:  ZOLOFT  2 po qd     simvastatin 40 MG tablet  Commonly known as:  ZOCOR  Take 1 tablet (40 mg total) by mouth at bedtime.     tadalafil 5 MG tablet  Commonly known as:  CIALIS  Take 1 tablet (5 mg total) by mouth daily as needed for erectile dysfunction.     tamsulosin 0.4 MG Caps capsule  Commonly known as:  FLOMAX  TAKE ONE (1) CAPSULE EACH DAY     zolpidem 10 MG tablet  Commonly known as:  AMBIEN  Take 1 tablet (10 mg total) by mouth at bedtime as needed.        Allergies:  Allergies  Allergen Reactions  . Codeine     anxiety  . Lipitor [Atorvastatin]     Myalgia    Family History: Family History  Problem Relation Age of Onset  . COPD Mother   . Hypertension Father   . Alzheimer's disease Father   . Diabetes Maternal Grandmother   . Kidney disease Neg Hx   . Prostate cancer Neg Hx     Social History:  reports that he has never smoked. He does not have any smokeless tobacco history on file. He reports that he drinks alcohol. He reports that he does not use illicit drugs.  ROS: UROLOGY Frequent Urination?: No Hard to postpone urination?: No Burning/pain with urination?: No Get up at night to urinate?: No Leakage of urine?: No Urine stream starts and stops?: No Trouble starting stream?: No Do you have to strain to urinate?: No Blood in urine?: No Urinary tract  infection?: No Sexually transmitted disease?: No Injury to kidneys or bladder?: No Painful intercourse?: No  Weak stream?: No Erection problems?: No Penile pain?: No  Gastrointestinal Nausea?: No Vomiting?: No Indigestion/heartburn?: No Diarrhea?: No Constipation?: No  Constitutional Fever: No Night sweats?: No Weight loss?: No Fatigue?: No  Skin Skin rash/lesions?: No Itching?: No  Eyes Blurred vision?: No Double vision?: No  Ears/Nose/Throat Sore throat?: No Sinus problems?: No  Hematologic/Lymphatic Swollen glands?: No Easy bruising?: No  Cardiovascular Leg swelling?: No Chest pain?: No  Respiratory Cough?: No Shortness of breath?: No  Endocrine Excessive thirst?: No  Musculoskeletal Back pain?: No Joint pain?: No  Neurological Headaches?: No Dizziness?: No  Psychologic Depression?: No Anxiety?: No  Physical Exam: BP 101/67 mmHg  Pulse 90  Ht 5\' 10"  (1.778 m)  Wt 228 lb 9.6 oz (103.692 kg)  BMI 32.80 kg/m2  Constitutional: Well nourished. Alert and oriented, No acute distress. HEENT: [place] AT, moist mucus membranes. Trachea midline, no masses. Cardiovascular: No clubbing, cyanosis, or edema. Respiratory: Normal respiratory effort, no increased work of breathing. Skin: No rashes, bruises or suspicious lesions. Lymph: No cervical or inguinal adenopathy. Neurologic: Grossly intact, no focal deficits, moving all 4 extremities. Psychiatric: Normal mood and affect.  Laboratory Data: Lab Results  Component Value Date   WBC 8.8 12/22/2012   HGB 14.1 09/26/2013   HCT 42.0 12/22/2012   MCV 91.9 12/22/2012   PLT 246 12/22/2012    Lab Results  Component Value Date   CREATININE 0.92 11/20/2015    Lab Results  Component Value Date   PSA 0.7 02/25/2015  PSA   1.0 ng/mL on 12/18/2015  Lab Results  Component Value Date   HGBA1C 6.6 11/20/2015   Pertinent images CLINICAL DATA: Scrotal mass on the left for 3 weeks.  EXAM: SCROTAL  ULTRASOUND  DOPPLER ULTRASOUND OF THE TESTICLES  TECHNIQUE: Complete ultrasound examination of the testicles, epididymis, and other scrotal structures was performed. Color and spectral Doppler ultrasound were also utilized to evaluate blood flow to the testicles.  COMPARISON: None.  FINDINGS: Right testicle  Measurements: 4.6 x 2.7 x 3.4 cm. No mass or microlithiasis visualized.  Left testicle  Measurements: 4.3 x 2.3 x 2.8 cm. No mass or microlithiasis visualized.  Right epididymis: Normal in size and appearance.  Left epididymis: Normal in size and appearance.  Hydrocele: Small right hydrocele  Varicocele: Mild left varicocele  Pulsed Doppler interrogation of both testes demonstrates normal low resistance arterial and venous waveforms bilaterally.  IMPRESSION: No testicular abnormality.  Small right hydrocele and mild left varicocele.   Electronically Signed  By: Rolm Baptise M.D.  On: 12/29/2015 11:51           Assessment & Plan:    1. Right hydrocele:    I reassured the patient that this is a benign finding. I discussed conservative management of the hydrocele with the patient. We will continue to monitor. He will return in 1 year for a repeat exam. He will contact the office if the hydrocele should enlarge or cause severe pain.  2. Left varicocele:   I reassured the patient that this is a benign finding. I discussed conservative management of the varicocele with the patient. We will continue to monitor. He will return in 1 year for a repeat exam. He will contact the office if the varicocele should enlarge or cause severe pain.  3. BPH with obstruction/lower urinary tract symptoms:   IPSS score is 7/1.  His symptoms are controlled with tamsulosin 0.4 mg daily.  A refill is not needed at this time.  He will return in  1 year for I PSS score, PSA and exam.  4. Erectile dysfunction:   Patient is having good results with Cialis 5 mg  daily. He'll return in 1 year for SHIM score and exam.  5. History of nephrolithiasis:   No symptoms at this time.  6. History of hypogonadism:   No symptoms at this time.   Return in about 1 year (around 01/01/2017) for IPSS score and SHIM score.  Zara Council, [facility] [address], [place], [place] [address] [phone]

## 2016-01-06 DIAGNOSIS — N433 Hydrocele, unspecified: Secondary | ICD-10-CM | POA: Insufficient documentation

## 2016-01-06 DIAGNOSIS — I861 Scrotal varices: Secondary | ICD-10-CM | POA: Insufficient documentation

## 2016-01-27 ENCOUNTER — Other Ambulatory Visit: Payer: Self-pay | Admitting: Family Medicine

## 2016-01-27 NOTE — Telephone Encounter (Signed)
Last filled 01/01/16, last seen 11/20/15. Call in at [facility]

## 2016-01-27 NOTE — Telephone Encounter (Signed)
Please call in ambien with 1 refills 

## 2016-01-27 NOTE — Telephone Encounter (Signed)
[facility] called into [facility] for Ambien Okayed per MMM

## 2016-02-13 ENCOUNTER — Other Ambulatory Visit: Payer: Self-pay | Admitting: Nurse Practitioner

## 2016-02-19 ENCOUNTER — Ambulatory Visit (INDEPENDENT_AMBULATORY_CARE_PROVIDER_SITE_OTHER): Payer: Commercial Managed Care - HMO | Admitting: Nurse Practitioner

## 2016-02-19 ENCOUNTER — Encounter: Payer: Self-pay | Admitting: Nurse Practitioner

## 2016-02-19 VITALS — BP 127/85 | HR 77 | Temp 97.9°F | Ht 70.0 in | Wt 229.0 lb

## 2016-02-19 DIAGNOSIS — G4700 Insomnia, unspecified: Secondary | ICD-10-CM

## 2016-02-19 DIAGNOSIS — I10 Essential (primary) hypertension: Secondary | ICD-10-CM | POA: Diagnosis not present

## 2016-02-19 DIAGNOSIS — K219 Gastro-esophageal reflux disease without esophagitis: Secondary | ICD-10-CM

## 2016-02-19 DIAGNOSIS — E785 Hyperlipidemia, unspecified: Secondary | ICD-10-CM | POA: Diagnosis not present

## 2016-02-19 DIAGNOSIS — M545 Low back pain, unspecified: Secondary | ICD-10-CM

## 2016-02-19 DIAGNOSIS — E119 Type 2 diabetes mellitus without complications: Secondary | ICD-10-CM | POA: Diagnosis not present

## 2016-02-19 DIAGNOSIS — F329 Major depressive disorder, single episode, unspecified: Secondary | ICD-10-CM

## 2016-02-19 DIAGNOSIS — G629 Polyneuropathy, unspecified: Secondary | ICD-10-CM

## 2016-02-19 DIAGNOSIS — F32A Depression, unspecified: Secondary | ICD-10-CM

## 2016-02-19 LAB — BAYER DCA HB A1C WAIVED: HB A1C (BAYER DCA - WAIVED): 6.5 % (ref <7.0)

## 2016-02-19 MED ORDER — METHYLPREDNISOLONE ACETATE 80 MG/ML IJ SUSP
80.0000 mg | Freq: Once | INTRAMUSCULAR | Status: AC
Start: 2016-02-19 — End: 2016-02-19
  Administered 2016-02-19: 80 mg via INTRAMUSCULAR

## 2016-02-19 NOTE — Progress Notes (Signed)
Subjective:    Patient ID: [name], male    DOB: 1961/02/17, 55 y.o.   MRN: [id number]   Patient here today for follow up of chronic medical problems.  Outpatient Encounter Prescriptions as of 02/19/2016  Medication Sig  . diazepam (VALIUM) 5 MG tablet Take 1-2 tablets (5-10 mg total) by mouth every 6 (six) hours as needed.  Marland Kitchen esomeprazole (NEXIUM) 40 MG capsule TAKE ONE (1) CAPSULE EACH DAY  . ezetimibe (ZETIA) 10 MG tablet Take 1 tablet (10 mg total) by mouth daily.  . fish oil-omega-3 fatty acids 1000 MG capsule Take 1 g by mouth 2 (two) times daily.  . fluticasone (FLONASE) 50 MCG/ACT nasal spray USE 1 SPRAY IN EACH NOSTRIL ONCE DAILY  . gabapentin (NEURONTIN) 300 MG capsule Take 1 capsule (300 mg total) by mouth at bedtime.  Marland Kitchen glucose blood (ONE TOUCH ULTRA TEST) test strip Test 1 x per day and prn-- dx 250.01  . hydrocortisone (ANUSOL-HC) 2.5 % rectal cream Place 1 application rectally 2 (two) times daily.  Marland Kitchen loratadine (CLARITIN) 10 MG tablet TAKE ONE (1) TABLET EACH DAY  . meloxicam (MOBIC) 15 MG tablet Reported on 01/02/2016  . metFORMIN (GLUCOPHAGE) 1000 MG tablet Take 1 tablet (1,000 mg total) by mouth 2 (two) times daily with a meal.  . Multiple Vitamin (MULTIVITAMIN WITH MINERALS) TABS Take 1 tablet by mouth daily.  Marland Kitchen olmesartan-hydrochlorothiazide (BENICAR HCT) 40-25 MG tablet TAKE ONE (1) TABLET EACH DAY  . olopatadine (PATANOL) 0.1 % ophthalmic solution INSTILL ONE DROP IN BOTH EYES TWICE DAILY  . oxyCODONE-acetaminophen (PERCOCET) 10-325 MG per tablet 1-2 tablets every 4-6 hrs as needed for pain  . sertraline (ZOLOFT) 100 MG tablet 2 po qd  . simvastatin (ZOCOR) 40 MG tablet Take 1 tablet (40 mg total) by mouth at bedtime.  . tadalafil (CIALIS) 5 MG tablet Take 1 tablet (5 mg total) by mouth daily as needed for erectile dysfunction.  . tamsulosin (FLOMAX) 0.4 MG CAPS capsule TAKE ONE (1) CAPSULE EACH DAY  . zolpidem (AMBIEN) 10 MG tablet TAKE ONE TABLET BY MOUTH AT  BEDTIME AS NEEDED   No facility-administered encounter medications on file as of 02/19/2016.    * patient was getting up some trees in yard and injured his lower back- he had back surgery 3 years ago.   Hyperglycemia This is a chronic problem. The current episode started more than 1 year ago. The problem has been waxing and waning. Associated symptoms include diaphoresis (Some night sweats recently ). Pertinent negatives include no chest pain, chills, headaches, nausea, neck pain or weakness. Associated symptoms comments: Tingling in bilateral feet. . Nothing aggravates the symptoms. He has tried rest, position changes, lying down, walking and sleep for the symptoms. The treatment provided significant relief.  Hypertension This is a chronic problem. The current episode started more than 1 year ago. The problem is unchanged. The problem is controlled. Pertinent negatives include no chest pain, headaches, neck pain, palpitations, peripheral edema or shortness of breath. There are no associated agents to hypertension. Risk factors for coronary artery disease include dyslipidemia, diabetes mellitus, male gender and obesity. Past treatments include angiotensin blockers and diuretics. The current treatment provides significant improvement. Compliance problems include diet and exercise.   Hyperlipidemia This is a chronic problem. The current episode started more than 1 year ago. The problem is uncontrolled. Recent lipid tests were reviewed and are high. Exacerbating diseases include diabetes and obesity. He has no history of hypothyroidism. Factors aggravating  his hyperlipidemia include thiazides. Pertinent negatives include no chest pain or shortness of breath. Current antihyperlipidemic treatment includes statins and ezetimibe. The current treatment provides moderate improvement of lipids. Compliance problems include adherence to diet and adherence to exercise.  Risk factors for coronary artery disease include  dyslipidemia, family history, hypertension and male sex.  Diabetes He presents for his follow-up diabetic visit. He has type 2 diabetes mellitus. No MedicAlert identification noted. There are no hypoglycemic associated symptoms. Pertinent negatives for hypoglycemia include no headaches. Associated symptoms include foot paresthesias (bilateral foot tingling last month). Pertinent negatives for diabetes include no chest pain and no weakness. There are no hypoglycemic complications. Risk factors for coronary artery disease include dyslipidemia, diabetes mellitus, hypertension, male sex and obesity. Current diabetic treatment includes oral agent (monotherapy). He is compliant with treatment most of the time. His weight is stable. When asked about meal planning, he reported none. He has not had a previous visit with a dietitian. His breakfast blood glucose is taken between 8-9 am. His breakfast blood glucose range is generally 130-140 mg/dl. His overall blood glucose range is 130-140 mg/dl. (Does not check very often) An ACE inhibitor/angiotensin II receptor blocker is being taken. He does not see a podiatrist.Eye exam is not current.  GERD nexium works well- no symptoms when takes med Depression He reports he weaned himself off of the Zoloft-last dose 01/09/2015 and has been feeling well since. Feels very positive about his situation.  insomnia Lorrin Mais helps him rest well without side effects Back pain He states he always has baseline back pain, but will only take valium and pain medications after increased activity and pain.  BPH flomax was helping with his symptoms but does not seem as effective in the last month.  ED cialis works well without side effects  Review of Systems  Constitutional: Positive for diaphoresis (Some night sweats recently ). Negative for chills and unexpected weight change.  Eyes: Negative for visual disturbance.  Respiratory: Negative for chest tightness and shortness of breath.    Cardiovascular: Negative for chest pain and palpitations.  Gastrointestinal: Negative for nausea.  Musculoskeletal: Negative for neck pain.  Neurological: Negative for weakness and headaches.  All other systems reviewed and are negative.      Objective:   Physical Exam  Constitutional: He is oriented to person, place, and time. He appears well-developed and well-nourished.  HENT:  Head: Normocephalic.  Right Ear: External ear normal.  Left Ear: External ear normal.  Nose: Nose normal.  Mouth/Throat: Oropharynx is clear and moist.  Eyes: EOM are normal. Pupils are equal, round, and reactive to light.  Neck: Normal range of motion. Neck supple. No JVD present. No thyromegaly present.  Cardiovascular: Normal rate, regular rhythm, normal heart sounds and intact distal pulses.  Exam reveals no gallop and no friction rub.   No murmur heard. Pulmonary/Chest: Effort normal and breath sounds normal. No respiratory distress. He has no wheezes. He has no rales. He exhibits no tenderness.  Abdominal: Soft. Bowel sounds are normal. He exhibits no mass. There is no tenderness.  Genitourinary:  Prostate exam deferred to urology  Musculoskeletal: Normal range of motion. He exhibits no edema.  Decrease ROM of lumbar spine due to pain on flexion and extension (-) SLR bil Motor strength and sensation distally intact  Lymphadenopathy:    He has no cervical adenopathy.  Neurological: He is alert and oriented to person, place, and time. No cranial nerve deficit.  Skin: Skin is warm and dry.  Psychiatric: He has a normal mood and affect. His behavior is normal. Judgment and thought content normal.   BP 127/85 mmHg  Pulse 77  Temp(Src) 97.9 F (36.6 C) (Oral)  Ht '5\' 10"'  (1.778 m)  Wt 229 lb (103.874 kg)  BMI 32.86 kg/m2  Hgba1c discussed at appointment- 6.5%      Assessment & Plan:  1. Essential hypertension Do not add salt to diet - CMP14+EGFR  2. Type 2 diabetes mellitus without  complication, without long-term current use of insulin (HCC) Continue to watch carbs - Bayer DCA Hb A1c Waived  3. Hyperlipidemia with target LDL less than 100 Low fat diet - Lipid panel  4. Gastroesophageal reflux disease without esophagitis Avoid spicy foods Do not eat 2 hours prior to bedtime  5. Neuropathy ([place])  6. Depression Stress management  7. Insomnia Bedtime ritual  8. Midline low back pain without sciatica Moist heat No heavy lifting - methylPREDNISolone acetate (DEPO-MEDROL) injection 80 mg; Inject 1 mL (80 mg total) into the muscle once.   Encouraged to do hemoccult cards Keep appointment for eye exam in morning Labs pending Health maintenance reviewed Diet and exercise encouraged Continue all meds Follow up  In 3 months   [place], FNP

## 2016-02-19 NOTE — Patient Instructions (Signed)

## 2016-02-20 LAB — CMP14+EGFR
ALBUMIN: 4.7 g/dL (ref 3.5–5.5)
ALK PHOS: 30 IU/L — AB (ref 39–117)
ALT: 25 IU/L (ref 0–44)
AST: 27 IU/L (ref 0–40)
Albumin/Globulin Ratio: 2.1 (ref 1.1–2.5)
BILIRUBIN TOTAL: 0.6 mg/dL (ref 0.0–1.2)
BUN / CREAT RATIO: 17 (ref 9–20)
BUN: 14 mg/dL (ref 6–24)
CHLORIDE: 98 mmol/L (ref 96–106)
CO2: 22 mmol/L (ref 18–29)
Calcium: 9.7 mg/dL (ref 8.7–10.2)
Creatinine, Ser: 0.81 mg/dL (ref 0.76–1.27)
GFR calc Af Amer: 115 mL/min/{1.73_m2} (ref >59)
GFR calc non Af Amer: 99 mL/min/{1.73_m2} (ref >59)
GLOBULIN, TOTAL: 2.2 g/dL (ref 1.5–4.5)
Glucose: 117 mg/dL — ABNORMAL HIGH (ref 65–99)
POTASSIUM: 4.4 mmol/L (ref 3.5–5.2)
SODIUM: 137 mmol/L (ref 134–144)
Total Protein: 6.9 g/dL (ref 6.0–8.5)

## 2016-02-20 LAB — LIPID PANEL
CHOLESTEROL TOTAL: 121 mg/dL (ref 100–199)
Chol/HDL Ratio: 3 ratio units (ref 0.0–5.0)
HDL: 41 mg/dL (ref >39)
LDL Calculated: 59 mg/dL (ref 0–99)
Triglycerides: 104 mg/dL (ref 0–149)
VLDL Cholesterol Cal: 21 mg/dL (ref 5–40)

## 2016-02-26 ENCOUNTER — Ambulatory Visit: Payer: Self-pay | Admitting: Urology

## 2016-03-08 ENCOUNTER — Other Ambulatory Visit: Payer: Self-pay | Admitting: Nurse Practitioner

## 2016-04-09 ENCOUNTER — Other Ambulatory Visit: Payer: Self-pay | Admitting: Nurse Practitioner

## 2016-04-09 NOTE — Telephone Encounter (Signed)
Patient last seen in office on 02-19-16. Rx for ambien last filled on 02-27-16 for #30. Please advise. If approved please route to Pool B so nurse can phone to pharmacy

## 2016-04-12 NOTE — Telephone Encounter (Signed)
Please call in ambien with 1 refills 

## 2016-05-11 ENCOUNTER — Other Ambulatory Visit: Payer: Self-pay | Admitting: Nurse Practitioner

## 2016-05-11 NOTE — Telephone Encounter (Signed)
Please call in ambien with 1 refills 

## 2016-05-11 NOTE — Telephone Encounter (Signed)
Patient seen for insomnia March 2017

## 2016-05-12 NOTE — Telephone Encounter (Signed)
Rx called in to pharmacy. 

## 2016-06-10 ENCOUNTER — Other Ambulatory Visit: Payer: Self-pay | Admitting: Nurse Practitioner

## 2016-06-10 ENCOUNTER — Other Ambulatory Visit: Payer: Self-pay | Admitting: Family Medicine

## 2016-06-21 ENCOUNTER — Encounter: Payer: Self-pay | Admitting: Nurse Practitioner

## 2016-06-21 ENCOUNTER — Ambulatory Visit (INDEPENDENT_AMBULATORY_CARE_PROVIDER_SITE_OTHER): Payer: Commercial Managed Care - HMO | Admitting: Nurse Practitioner

## 2016-06-21 VITALS — BP 111/76 | HR 70 | Temp 97.2°F | Ht 70.0 in | Wt 234.0 lb

## 2016-06-21 DIAGNOSIS — N138 Other obstructive and reflux uropathy: Secondary | ICD-10-CM

## 2016-06-21 DIAGNOSIS — G8929 Other chronic pain: Secondary | ICD-10-CM

## 2016-06-21 DIAGNOSIS — F32A Depression, unspecified: Secondary | ICD-10-CM

## 2016-06-21 DIAGNOSIS — N401 Enlarged prostate with lower urinary tract symptoms: Secondary | ICD-10-CM | POA: Diagnosis not present

## 2016-06-21 DIAGNOSIS — K219 Gastro-esophageal reflux disease without esophagitis: Secondary | ICD-10-CM | POA: Diagnosis not present

## 2016-06-21 DIAGNOSIS — G629 Polyneuropathy, unspecified: Secondary | ICD-10-CM

## 2016-06-21 DIAGNOSIS — N529 Male erectile dysfunction, unspecified: Secondary | ICD-10-CM

## 2016-06-21 DIAGNOSIS — F329 Major depressive disorder, single episode, unspecified: Secondary | ICD-10-CM

## 2016-06-21 DIAGNOSIS — T148 Other injury of unspecified body region: Secondary | ICD-10-CM | POA: Diagnosis not present

## 2016-06-21 DIAGNOSIS — I10 Essential (primary) hypertension: Secondary | ICD-10-CM

## 2016-06-21 DIAGNOSIS — M549 Dorsalgia, unspecified: Secondary | ICD-10-CM

## 2016-06-21 DIAGNOSIS — W57XXXA Bitten or stung by nonvenomous insect and other nonvenomous arthropods, initial encounter: Secondary | ICD-10-CM | POA: Diagnosis not present

## 2016-06-21 DIAGNOSIS — E119 Type 2 diabetes mellitus without complications: Secondary | ICD-10-CM | POA: Diagnosis not present

## 2016-06-21 DIAGNOSIS — E785 Hyperlipidemia, unspecified: Secondary | ICD-10-CM

## 2016-06-21 DIAGNOSIS — G4700 Insomnia, unspecified: Secondary | ICD-10-CM

## 2016-06-21 DIAGNOSIS — N528 Other male erectile dysfunction: Secondary | ICD-10-CM

## 2016-06-21 LAB — BAYER DCA HB A1C WAIVED: HB A1C (BAYER DCA - WAIVED): 7.6 % — ABNORMAL HIGH (ref <7.0)

## 2016-06-21 MED ORDER — GABAPENTIN 300 MG PO CAPS: 300.0000 mg | ORAL_CAPSULE | Freq: Every day | ORAL | Status: DC

## 2016-06-21 MED ORDER — METFORMIN HCL 1000 MG PO TABS: 1000.0000 mg | ORAL_TABLET | Freq: Two times a day (BID) | ORAL | Status: DC

## 2016-06-21 MED ORDER — SIMVASTATIN 40 MG PO TABS: 40.0000 mg | ORAL_TABLET | Freq: Every day | ORAL | Status: DC

## 2016-06-21 MED ORDER — EZETIMIBE 10 MG PO TABS
10.0000 mg | ORAL_TABLET | Freq: Every day | ORAL | Status: DC
Start: 2016-06-21 — End: 2017-01-10

## 2016-06-21 MED ORDER — ESOMEPRAZOLE MAGNESIUM 40 MG PO CPDR: 40.0000 mg | DELAYED_RELEASE_CAPSULE | Freq: Every day | ORAL | Status: DC

## 2016-06-21 MED ORDER — TADALAFIL 5 MG PO TABS: 5.0000 mg | ORAL_TABLET | Freq: Every day | ORAL | Status: DC | PRN

## 2016-06-21 MED ORDER — SERTRALINE HCL 100 MG PO TABS: ORAL_TABLET | ORAL | Status: DC

## 2016-06-21 MED ORDER — OLMESARTAN MEDOXOMIL-HCTZ 40-25 MG PO TABS: ORAL_TABLET | ORAL | Status: DC

## 2016-06-21 MED ORDER — OXYCODONE-ACETAMINOPHEN 10-325 MG PO TABS: ORAL_TABLET | ORAL | Status: DC

## 2016-06-21 MED ORDER — ZOLPIDEM TARTRATE 10 MG PO TABS: 10.0000 mg | ORAL_TABLET | Freq: Every evening | ORAL | Status: DC | PRN

## 2016-06-21 NOTE — Progress Notes (Signed)
Subjective:    Patient ID: [name], male    DOB: 1961/09/01, 55 y.o.   MRN: [id number]   Patient here today for follow up of chronic medical problems.  Outpatient Encounter Prescriptions as of 06/21/2016  Medication Sig  . diazepam (VALIUM) 5 MG tablet Take 1-2 tablets (5-10 mg total) by mouth every 6 (six) hours as needed.  Marland Kitchen esomeprazole (NEXIUM) 40 MG capsule TAKE ONE (1) CAPSULE EACH DAY  . ezetimibe (ZETIA) 10 MG tablet Take 1 tablet (10 mg total) by mouth daily.  . fish oil-omega-3 fatty acids 1000 MG capsule Take 1 g by mouth 2 (two) times daily.  . fluticasone (FLONASE) 50 MCG/ACT nasal spray USE 1 SPRAY IN EACH NOSTRIL ONCE DAILY  . gabapentin (NEURONTIN) 300 MG capsule TAKE ONE CAPSULE BY MOUTH AT BEDTIME  . glucose blood (ONE TOUCH ULTRA TEST) test strip Test 1 x per day and prn-- dx 250.01  . hydrocortisone (ANUSOL-HC) 2.5 % rectal cream Place 1 application rectally 2 (two) times daily.  Marland Kitchen loratadine (CLARITIN) 10 MG tablet TAKE ONE (1) TABLET EACH DAY  . meloxicam (MOBIC) 15 MG tablet Reported on 01/02/2016  . metFORMIN (GLUCOPHAGE) 1000 MG tablet Take 1 tablet (1,000 mg total) by mouth 2 (two) times daily with a meal.  . Multiple Vitamin (MULTIVITAMIN WITH MINERALS) TABS Take 1 tablet by mouth daily.  Marland Kitchen olmesartan-hydrochlorothiazide (BENICAR HCT) 40-25 MG tablet TAKE ONE (1) TABLET EACH DAY  . olopatadine (PATANOL) 0.1 % ophthalmic solution INSTILL ONE DROP IN BOTH EYES TWICE DAILY  . oxyCODONE-acetaminophen (PERCOCET) 10-325 MG per tablet 1-2 tablets every 4-6 hrs as needed for pain  . sertraline (ZOLOFT) 100 MG tablet 2 po qd  . simvastatin (ZOCOR) 40 MG tablet Take 1 tablet (40 mg total) by mouth at bedtime.  . tadalafil (CIALIS) 5 MG tablet Take 1 tablet (5 mg total) by mouth daily as needed for erectile dysfunction.  . tamsulosin (FLOMAX) 0.4 MG CAPS capsule TAKE ONE (1) CAPSULE EACH DAY  . zolpidem (AMBIEN) 10 MG tablet TAKE ONE TABLET AT BEDTIME AS NEEDED          Hyperglycemia This is a chronic problem. The current episode started more than 1 year ago. The problem has been waxing and waning. Associated symptoms include diaphoresis (Some night sweats recently ). Pertinent negatives include no chest pain, chills, headaches, nausea, neck pain or weakness. Associated symptoms comments: Tingling in bilateral feet. . Nothing aggravates the symptoms. He has tried rest, position changes, lying down, walking and sleep for the symptoms. The treatment provided significant relief.  Hypertension This is a chronic problem. The current episode started more than 1 year ago. The problem is unchanged. The problem is controlled. Pertinent negatives include no chest pain, headaches, neck pain, palpitations, peripheral edema or shortness of breath. There are no associated agents to hypertension. Risk factors for coronary artery disease include dyslipidemia, diabetes mellitus, male gender and obesity. Past treatments include angiotensin blockers and diuretics. The current treatment provides significant improvement. Compliance problems include diet and exercise.   Hyperlipidemia This is a chronic problem. The current episode started more than 1 year ago. The problem is uncontrolled. Recent lipid tests were reviewed and are high. Exacerbating diseases include diabetes and obesity. He has no history of hypothyroidism. Factors aggravating his hyperlipidemia include thiazides. Pertinent negatives include no chest pain or shortness of breath. Current antihyperlipidemic treatment includes statins and ezetimibe. The current treatment provides moderate improvement of lipids. Compliance problems include adherence to  diet and adherence to exercise.  Risk factors for coronary artery disease include dyslipidemia, family history, hypertension and male sex.  Diabetes He presents for his follow-up diabetic visit. He has type 2 diabetes mellitus. No MedicAlert identification noted. There are no  hypoglycemic associated symptoms. Pertinent negatives for hypoglycemia include no headaches. Associated symptoms include foot paresthesias (bilateral foot tingling last month). Pertinent negatives for diabetes include no chest pain and no weakness. There are no hypoglycemic complications. Risk factors for coronary artery disease include dyslipidemia, diabetes mellitus, hypertension, male sex and obesity. Current diabetic treatment includes oral agent (monotherapy). He is compliant with treatment most of the time. His weight is stable. When asked about meal planning, he reported none. He has not had a previous visit with a dietitian. His breakfast blood glucose is taken between 8-9 am. His breakfast blood glucose range is generally 130-140 mg/dl. His overall blood glucose range is 130-140 mg/dl. (Does not check very often) An ACE inhibitor/angiotensin II receptor blocker is being taken. He does not see a podiatrist.Eye exam is not current.  GERD nexium works well- no symptoms when takes med Depression He reports he weaned himself off of the Zoloft-last dose 01/09/2015 and has been feeling well since. Feels very positive about his situation.  insomnia Lorrin Mais helps him rest well without side effects Back pain He states he always has baseline back pain, but will only take valium and pain medications after increased activity and pain. Has ran out pf percocet that was rx over a year ago. BPH flomax was helping with his symptoms but does not seem as effective in the last month.  ED cialis works well without side effects  Review of Systems  Constitutional: Positive for diaphoresis (Some night sweats recently ). Negative for chills and unexpected weight change.  Eyes: Negative for visual disturbance.  Respiratory: Negative for chest tightness and shortness of breath.   Cardiovascular: Negative for chest pain and palpitations.  Gastrointestinal: Negative for nausea.  Musculoskeletal: Negative for neck pain.   Neurological: Negative for weakness and headaches.  All other systems reviewed and are negative.      Objective:   Physical Exam  Constitutional: He is oriented to person, place, and time. He appears well-developed and well-nourished.  HENT:  Head: Normocephalic.  Right Ear: External ear normal.  Left Ear: External ear normal.  Nose: Nose normal.  Mouth/Throat: Oropharynx is clear and moist.  Eyes: EOM are normal. Pupils are equal, round, and reactive to light.  Neck: Normal range of motion. Neck supple. No JVD present. No thyromegaly present.  Cardiovascular: Normal rate, regular rhythm, normal heart sounds and intact distal pulses.  Exam reveals no gallop and no friction rub.   No murmur heard. Pulmonary/Chest: Effort normal and breath sounds normal. No respiratory distress. He has no wheezes. He has no rales. He exhibits no tenderness.  Abdominal: Soft. Bowel sounds are normal. He exhibits no mass. There is no tenderness.  Genitourinary:  Prostate exam deferred to urology  Musculoskeletal: Normal range of motion. He exhibits no edema.  Decrease ROM of lumbar spine due to pain on flexion and extension (-) SLR bil Motor strength and sensation distally intact  Lymphadenopathy:    He has no cervical adenopathy.  Neurological: He is alert and oriented to person, place, and time. No cranial nerve deficit.  Skin: Skin is warm and dry.  Psychiatric: He has a normal mood and affect. His behavior is normal. Judgment and thought content normal.   BP 111/76 mmHg  Pulse 70  Temp(Src) 97.2 F (36.2 C) (Oral)  Ht '5\' 10"'  (1.778 m)  Wt 234 lb (106.142 kg)  BMI 33.58 kg/m2  hgba1c 7.6% up from 6.5% at last visit       Assessment & Plan:  1. Essential hypertension Do not add salt to deit - CMP14+EGFR - olmesartan-hydrochlorothiazide (BENICAR HCT) 40-25 MG tablet; TAKE ONE (1) TABLET EACH DAY  Dispense: 30 tablet; Refill: 5  2. Type 2 diabetes mellitus without complication,  without long-term current use of insulin (HCC) Stricter carb counting Patient wants o see if can get back under control on his own - Bayer DCA Hb A1c Waived - metFORMIN (GLUCOPHAGE) 1000 MG tablet; Take 1 tablet (1,000 mg total) by mouth 2 (two) times daily with a meal.  Dispense: 60 tablet; Refill: 5  3. Hyperlipidemia with target LDL less than 100 Low fat diet - Lipid panel - ezetimibe (ZETIA) 10 MG tablet; Take 1 tablet (10 mg total) by mouth daily.  Dispense: 30 tablet; Refill: 5 - simvastatin (ZOCOR) 40 MG tablet; Take 1 tablet (40 mg total) by mouth at bedtime.  Dispense: 30 tablet; Refill: 5  4. Tick bite - Lyme Ab/Western Blot Reflex  5. Gastroesophageal reflux disease without esophagitis Avoid spicy foods Do not eat 2 hours prior to bedtime - esomeprazole (NEXIUM) 40 MG capsule; Take 1 capsule (40 mg total) by mouth daily at 12 noon.  Dispense: 30 capsule; Refill: 5  6. Neuropathy ([place]) Do not go barefooted - gabapentin (NEURONTIN) 300 MG capsule; Take 1 capsule (300 mg total) by mouth at bedtime.  Dispense: 30 capsule; Refill: 5  7. BPH with obstruction/lower urinary tract symptoms  8. Erectile dysfunction of organic origin - tadalafil (CIALIS) 5 MG tablet; Take 1 tablet (5 mg total) by mouth daily as needed for erectile dysfunction.  Dispense: 30 tablet; Refill: 12  9. Depression Stress management - sertraline (ZOLOFT) 100 MG tablet; 2 po qd  Dispense: 60 tablet; Refill: 5  10. Insomnia Bedtime ritual - zolpidem (AMBIEN) 10 MG tablet; Take 1 tablet (10 mg total) by mouth at bedtime as needed.  Dispense: 30 tablet; Refill: 2  11. Chronic back pain - oxyCODONE-acetaminophen (PERCOCET) 10-325 MG tablet; 1-2 tablets every 4-6 hrs as needed for pain  Dispense: 60 tablet; Refill: 0   Do hemoccult cards given Labs pending Health maintenance reviewed Diet and exercise encouraged Continue all meds Follow up  In 3 months   [place], FNP

## 2016-06-21 NOTE — Patient Instructions (Signed)

## 2016-06-22 LAB — LYME AB/WESTERN BLOT REFLEX: Lyme IgG/IgM Ab: <0.91 {ISR} (ref 0.00–0.90)

## 2016-06-22 LAB — CMP14+EGFR
ALBUMIN: 4.8 g/dL (ref 3.5–5.5)
ALK PHOS: 35 IU/L — AB (ref 39–117)
ALT: 32 IU/L (ref 0–44)
AST: 35 IU/L (ref 0–40)
Albumin/Globulin Ratio: 2.3 — ABNORMAL HIGH (ref 1.2–2.2)
BUN / CREAT RATIO: 25 — AB (ref 9–20)
BUN: 30 mg/dL — AB (ref 6–24)
Bilirubin Total: 0.4 mg/dL (ref 0.0–1.2)
CO2: 20 mmol/L (ref 18–29)
CREATININE: 1.18 mg/dL (ref 0.76–1.27)
Calcium: 9.2 mg/dL (ref 8.7–10.2)
Chloride: 101 mmol/L (ref 96–106)
GFR calc Af Amer: 79 mL/min/{1.73_m2} (ref >59)
GFR calc non Af Amer: 68 mL/min/{1.73_m2} (ref >59)
GLUCOSE: 144 mg/dL — AB (ref 65–99)
Globulin, Total: 2.1 g/dL (ref 1.5–4.5)
Potassium: 4.3 mmol/L (ref 3.5–5.2)
Sodium: 141 mmol/L (ref 134–144)
TOTAL PROTEIN: 6.9 g/dL (ref 6.0–8.5)

## 2016-06-22 LAB — LIPID PANEL
CHOLESTEROL TOTAL: 138 mg/dL (ref 100–199)
Chol/HDL Ratio: 3.9 ratio units (ref 0.0–5.0)
HDL: 35 mg/dL — AB (ref >39)
LDL CALC: 78 mg/dL (ref 0–99)
Triglycerides: 125 mg/dL (ref 0–149)
VLDL CHOLESTEROL CAL: 25 mg/dL (ref 5–40)

## 2016-06-22 NOTE — Progress Notes (Signed)
Patient aware.

## 2016-07-07 NOTE — Progress Notes (Signed)
10:28 AM   [name] 08/30/1961 [id number]  Referring provider: Chevis Pretty, [place], [place] [address]  Chief Complaint  Patient presents with  . Groin Pain    HPI: Patient is a 55 year old Caucasian male who presents today complaining of a 3 to 4 week history of scrotal pain.    Patient has a remote history of hypogonadism and nephrolithiasis, BPH with LUTS, erectile dysfunction.  A scrotal ultrasound performed earlier this year demonstrated a small right hydrocele and a mild left varicocele.  No testicular abnormality.    He states the scrotum does swell intermittently.  He is not having dysuria, penile discharge or other urinary symptoms.  He describes the pain as throbbing.  He has found Mobic helps to ease the pain.  He has also been having dark urine with a foul odor.  He has not had flank pain, fevers, chills, nausea or vomiting.   He has not had any recent imaging studies or passage of fragments.    His UA is unremarkable.  Patient admits to not keeping hydrated while working outside.  The malodorous urine happens after he is outside.     PMH: Past Medical History:  Diagnosis Date  . Arthritis   . Chronic back pain    stenosis  . Constipation    with pain meds  . Depression    takes Zoloft daily  . Diabetes mellitus without complication ([place])    takes Metformni daily  . Dizziness   . GERD (gastroesophageal reflux disease)    takes Nexium daily  . Headache(784.0)    occasionally  . Hyperlipidemia    takes Zetia daily  . Hypertension    takes Benicar daily  . Impingement syndrome of left shoulder   . Insomnia    takes Ambien nightly  . Joint pain   . Left rotator cuff tear   . Pneumonia 1986   walking  . Seasonal allergies    takes Claritin daily  . Tear of medial meniscus of right knee   . Urinary frequency    takes Flomax daily  . Urinary urgency     Surgical History: Past Surgical History:  Procedure  Laterality Date  . BACK SURGERY  [id number]   lumb fusion  . COLONOSCOPY    . KNEE ARTHROSCOPY WITH MEDIAL MENISECTOMY Right 09/26/2013   Procedure: RIGHT KNEE ARTHROSCOPY WITH PARTIAL MEDIAL and lateral chrondroplasty MENISECTOMY;  Surgeon: Lorn Junes, MD;  Location: [facility];  Service: Orthopedics;  Laterality: Right;  . left elbow surgery    . LITHOTRIPSY    . NASAL SEPTUM SURGERY    . removal of kidney stones  1981   lt open removal  . SHOULDER ARTHROSCOPY WITH ROTATOR CUFF REPAIR AND SUBACROMIAL DECOMPRESSION Left 09/26/2013   Procedure: LEFT SHOULDER ARTHROSCOPY WITH DEBRIDEMENT, PARTIAL ROTATOR CUFF REPAIR AND SUBACROMIAL DECOMPRESSION AND SAD ACD DISTAL CLAVICULECTOMY;  Surgeon: Lorn Junes, MD;  Location: [facility];  Service: Orthopedics;  Laterality: Left;  . TONSILLECTOMY      Home Medications:    Medication List       Accurate as of 07/08/16 10:28 AM. Always use your most recent med list.          diazepam 5 MG tablet Commonly known as:  VALIUM Take 1-2 tablets (5-10 mg total) by mouth every 6 (six) hours as needed.   esomeprazole 40 MG capsule Commonly known as:  NEXIUM Take 1 capsule (  40 mg total) by mouth daily at 12 noon.   ezetimibe 10 MG tablet Commonly known as:  ZETIA Take 1 tablet (10 mg total) by mouth daily.   fish oil-omega-3 fatty acids 1000 MG capsule Take 1 g by mouth 2 (two) times daily.   fluticasone 50 MCG/ACT nasal spray Commonly known as:  FLONASE USE 1 SPRAY IN EACH NOSTRIL ONCE DAILY   gabapentin 300 MG capsule Commonly known as:  NEURONTIN Take 1 capsule (300 mg total) by mouth at bedtime.   gabapentin 300 MG capsule Commonly known as:  NEURONTIN Take 1 capsule (300 mg total) by mouth at bedtime.   glucose blood test strip Commonly known as:  ONE TOUCH ULTRA TEST Test 1 x per day and prn-- dx 250.01   hydrocortisone 2.5 % rectal cream Commonly known as:  ANUSOL-HC Place 1 application  rectally 2 (two) times daily.   loratadine 10 MG tablet Commonly known as:  CLARITIN TAKE ONE (1) TABLET EACH DAY   meloxicam 15 MG tablet Commonly known as:  MOBIC Reported on 01/02/2016   metFORMIN 1000 MG tablet Commonly known as:  GLUCOPHAGE Take 1 tablet (1,000 mg total) by mouth 2 (two) times daily with a meal.   multivitamin with minerals Tabs tablet Take 1 tablet by mouth daily.   olmesartan-hydrochlorothiazide 40-25 MG tablet Commonly known as:  BENICAR HCT TAKE ONE (1) TABLET EACH DAY   olopatadine 0.1 % ophthalmic solution Commonly known as:  PATANOL INSTILL ONE DROP IN BOTH EYES TWICE DAILY   oxyCODONE-acetaminophen 10-325 MG tablet Commonly known as:  PERCOCET 1-2 tablets every 4-6 hrs as needed for pain   sertraline 100 MG tablet Commonly known as:  ZOLOFT 2 po qd   simvastatin 40 MG tablet Commonly known as:  ZOCOR Take 1 tablet (40 mg total) by mouth at bedtime.   tadalafil 5 MG tablet Commonly known as:  CIALIS Take 1 tablet (5 mg total) by mouth daily as needed for erectile dysfunction.   tamsulosin 0.4 MG Caps capsule Commonly known as:  FLOMAX TAKE ONE (1) CAPSULE EACH DAY   zolpidem 10 MG tablet Commonly known as:  AMBIEN Take 1 tablet (10 mg total) by mouth at bedtime as needed.       Allergies:  Allergies  Allergen Reactions  . Codeine     anxiety  . Lipitor [Atorvastatin]     Myalgia    Family History: Family History  Problem Relation Age of Onset  . COPD Mother   . Hypertension Father   . Alzheimer's disease Father   . Diabetes Maternal Grandmother   . Kidney disease Neg Hx   . Prostate cancer Neg Hx     Social History:  reports that he has never smoked. He does not have any smokeless tobacco history on file. He reports that he drinks alcohol. He reports that he does not use drugs.  ROS: UROLOGY Frequent Urination?: No Hard to postpone urination?: No Burning/pain with urination?: No Get up at night to urinate?:  No Leakage of urine?: No Urine stream starts and stops?: No Trouble starting stream?: No Do you have to strain to urinate?: No Blood in urine?: No Urinary tract infection?: No Sexually transmitted disease?: No Injury to kidneys or bladder?: No Painful intercourse?: No Weak stream?: No Erection problems?: No Penile pain?: No  Gastrointestinal Nausea?: No Vomiting?: No Indigestion/heartburn?: No Diarrhea?: No Constipation?: No  Constitutional Fever: No Night sweats?: No Weight loss?: No Fatigue?: No  Skin Skin rash/lesions?: No  Itching?: No  Eyes Blurred vision?: No Double vision?: No  Ears/Nose/Throat Sore throat?: No Sinus problems?: Yes  Hematologic/Lymphatic Swollen glands?: No Easy bruising?: No  Cardiovascular Leg swelling?: No Chest pain?: No  Respiratory Cough?: No Shortness of breath?: No  Endocrine Excessive thirst?: No  Musculoskeletal Back pain?: Yes Joint pain?: No  Neurological Headaches?: No Dizziness?: No  Psychologic Depression?: No Anxiety?: No  Physical Exam: BP 135/82   Pulse 79   Ht 5\' 10"  (1.778 m)   Wt 234 lb 9.6 oz (106.4 kg)   BMI 33.66 kg/m   Constitutional: Well nourished. Alert and oriented, No acute distress. HEENT: [place] AT, moist mucus membranes. Trachea midline, no masses. Cardiovascular: No clubbing, cyanosis, or edema. Respiratory: Normal respiratory effort, no increased work of breathing. GI: Abdomen is soft, non tender, non distended, no abdominal masses. Liver and spleen not palpable.  No hernias appreciated.  Stool sample for occult testing is not indicated.   GU: No CVA tenderness.  No bladder fullness or masses.  Patient with circumcised phallus.   Urethral meatus is patent.  No penile discharge. No penile lesions or rashes. Scrotum without lesions, cysts, rashes and/or edema.  Testicles are located scrotally bilaterally. No masses are appreciated in the testicles. Left and right epididymis are normal.   Tender in the right inguinal canal.  Rectal: Not performed at today's visit.   Skin: No rashes, bruises or suspicious lesions. Lymph: No cervical or inguinal adenopathy. Neurologic: Grossly intact, no focal deficits, moving all 4 extremities. Psychiatric: Normal mood and affect.  Laboratory Data: Lab Results  Component Value Date   WBC 8.8 12/22/2012   HGB 14.1 09/26/2013   HCT 42.0 12/22/2012   MCV 91.9 12/22/2012   PLT 246 12/22/2012    Lab Results  Component Value Date   CREATININE 1.18 06/21/2016    Lab Results  Component Value Date   PSA 0.7 02/25/2015  PSA   1.0 ng/mL on 12/18/2015  Lab Results  Component Value Date   HGBA1C 6.6 11/20/2015   Pertinent images CLINICAL DATA: Scrotal mass on the left for 3 weeks.  EXAM: SCROTAL ULTRASOUND  DOPPLER ULTRASOUND OF THE TESTICLES  TECHNIQUE: Complete ultrasound examination of the testicles, epididymis, and other scrotal structures was performed. Color and spectral Doppler ultrasound were also utilized to evaluate blood flow to the testicles.  COMPARISON: None.  FINDINGS: Right testicle  Measurements: 4.6 x 2.7 x 3.4 cm. No mass or microlithiasis visualized.  Left testicle  Measurements: 4.3 x 2.3 x 2.8 cm. No mass or microlithiasis visualized.  Right epididymis: Normal in size and appearance.  Left epididymis: Normal in size and appearance.  Hydrocele: Small right hydrocele  Varicocele: Mild left varicocele  Pulsed Doppler interrogation of both testes demonstrates normal low resistance arterial and venous waveforms bilaterally.  IMPRESSION: No testicular abnormality.  Small right hydrocele and mild left varicocele.   Electronically Signed  By: Rolm Baptise M.D.  On: 12/29/2015 11:51           Assessment & Plan:    1. Right groin:   Patient's scrotal exam was unremarkable.  He did have tenderness in the right inguinal canal.  He does have a history of  nephrolithiasis, so I will obtain a CT renal scan study to rule out referred pain due to a stone.  I have also sent in a a script for gabapentin 300 mg qhs.  2. Malodorous urine:   Most likely due to dehydration.  3. Right hydrocele:  I reassured the patient that this is a benign finding. I discussed conservative management of the hydrocele with the patient. We will continue to monitor.   4. Left varicocele:   I reassured the patient that this is a benign finding. I discussed conservative management of the varicocele with the patient. We will continue to monitor.     5. BPH with obstruction/lower urinary tract symptoms:   Not addressed at this visit.    6. Erectile dysfunction:   Not addressed at this visit.    7. History of nephrolithiasis:   See above.    8. History of hypogonadism:   Not addressed at this visit.     Return for CT report.  Zara Council, [facility] [address], [place], [place] [address] [phone]

## 2016-07-08 ENCOUNTER — Ambulatory Visit: Payer: Commercial Managed Care - HMO | Admitting: Urology

## 2016-07-08 ENCOUNTER — Encounter: Payer: Self-pay | Admitting: Urology

## 2016-07-08 ENCOUNTER — Other Ambulatory Visit: Payer: Self-pay | Admitting: Family Medicine

## 2016-07-08 VITALS — BP 135/82 | HR 79 | Ht 70.0 in | Wt 234.6 lb

## 2016-07-08 DIAGNOSIS — R1030 Lower abdominal pain, unspecified: Secondary | ICD-10-CM

## 2016-07-08 DIAGNOSIS — R1031 Right lower quadrant pain: Secondary | ICD-10-CM

## 2016-07-08 DIAGNOSIS — G629 Polyneuropathy, unspecified: Secondary | ICD-10-CM

## 2016-07-08 DIAGNOSIS — N433 Hydrocele, unspecified: Secondary | ICD-10-CM | POA: Diagnosis not present

## 2016-07-08 DIAGNOSIS — I861 Scrotal varices: Secondary | ICD-10-CM | POA: Diagnosis not present

## 2016-07-08 DIAGNOSIS — R8290 Unspecified abnormal findings in urine: Secondary | ICD-10-CM

## 2016-07-08 DIAGNOSIS — Z87442 Personal history of urinary calculi: Secondary | ICD-10-CM

## 2016-07-08 LAB — URINALYSIS, COMPLETE
Bilirubin, UA: NEGATIVE
Ketones, UA: NEGATIVE
Leukocytes, UA: NEGATIVE
NITRITE UA: NEGATIVE
PH UA: 5 (ref 5.0–7.5)
Protein, UA: NEGATIVE
RBC, UA: NEGATIVE
Specific Gravity, UA: 1.025 (ref 1.005–1.030)
UUROB: 0.2 mg/dL (ref 0.2–1.0)

## 2016-07-08 LAB — MICROSCOPIC EXAMINATION
Bacteria, UA: NONE SEEN
RBC MICROSCOPIC, UA: NONE SEEN /HPF (ref 0–?)
WBC UA: NONE SEEN /HPF (ref 0–?)

## 2016-07-08 MED ORDER — GABAPENTIN 300 MG PO CAPS: 300.0000 mg | ORAL_CAPSULE | Freq: Every day | ORAL | 5 refills | Status: DC

## 2016-07-08 MED ORDER — GABAPENTIN 300 MG PO CAPS: 300.0000 mg | ORAL_CAPSULE | Freq: Every day | ORAL | 12 refills | Status: DC

## 2016-07-19 ENCOUNTER — Ambulatory Visit
Admission: RE | Admit: 2016-07-19 | Discharge: 2016-07-19 | Disposition: A | Discharge status: Home / Self Care | Payer: 59 | Source: Ambulatory Visit | Attending: Urology | Admitting: Urology

## 2016-07-19 DIAGNOSIS — I700 Atherosclerosis of aorta: Secondary | ICD-10-CM | POA: Diagnosis not present

## 2016-07-19 DIAGNOSIS — R1030 Lower abdominal pain, unspecified: Secondary | ICD-10-CM | POA: Diagnosis present

## 2016-07-19 DIAGNOSIS — R918 Other nonspecific abnormal finding of lung field: Secondary | ICD-10-CM | POA: Insufficient documentation

## 2016-07-19 DIAGNOSIS — R1031 Right lower quadrant pain: Secondary | ICD-10-CM

## 2016-07-19 DIAGNOSIS — K760 Fatty (change of) liver, not elsewhere classified: Secondary | ICD-10-CM | POA: Diagnosis not present

## 2016-07-22 ENCOUNTER — Telehealth: Payer: Self-pay | Admitting: Urology

## 2016-07-22 NOTE — Telephone Encounter (Signed)
Pt called requesting his CT results from Monday 07/19/16

## 2016-07-26 ENCOUNTER — Telehealth: Payer: Self-pay

## 2016-07-26 NOTE — Telephone Encounter (Signed)
-----   Message from Nori Riis, PA-C sent at 07/22/2016  7:47 PM EDT ----- Please notify the patient that the CT scan did not identify any kidney stones.

## 2016-07-26 NOTE — Telephone Encounter (Signed)
Spoke with pt in reference to CT results. Pt voiced understanding.  

## 2016-08-05 ENCOUNTER — Encounter: Payer: Self-pay | Admitting: Urology

## 2016-08-05 ENCOUNTER — Ambulatory Visit (INDEPENDENT_AMBULATORY_CARE_PROVIDER_SITE_OTHER): Payer: Commercial Managed Care - HMO | Admitting: Urology

## 2016-08-05 ENCOUNTER — Telehealth: Payer: Self-pay | Admitting: Urology

## 2016-08-05 VITALS — BP 107/70 | HR 67 | Ht 70.0 in | Wt 228.2 lb

## 2016-08-05 DIAGNOSIS — I861 Scrotal varices: Secondary | ICD-10-CM | POA: Diagnosis not present

## 2016-08-05 DIAGNOSIS — R1031 Right lower quadrant pain: Secondary | ICD-10-CM | POA: Diagnosis not present

## 2016-08-05 DIAGNOSIS — N401 Enlarged prostate with lower urinary tract symptoms: Secondary | ICD-10-CM | POA: Diagnosis not present

## 2016-08-05 DIAGNOSIS — Z87442 Personal history of urinary calculi: Secondary | ICD-10-CM | POA: Diagnosis not present

## 2016-08-05 DIAGNOSIS — N433 Hydrocele, unspecified: Secondary | ICD-10-CM | POA: Diagnosis not present

## 2016-08-05 DIAGNOSIS — N138 Other obstructive and reflux uropathy: Secondary | ICD-10-CM

## 2016-08-05 LAB — MICROSCOPIC EXAMINATION
BACTERIA UA: NONE SEEN
WBC, UA: NONE SEEN /hpf (ref 0–?)

## 2016-08-05 LAB — URINALYSIS, COMPLETE
BILIRUBIN UA: NEGATIVE
Ketones, UA: NEGATIVE
LEUKOCYTES UA: NEGATIVE
Nitrite, UA: NEGATIVE
PH UA: 5 (ref 5.0–7.5)
PROTEIN UA: NEGATIVE
RBC UA: NEGATIVE
SPEC GRAV UA: 1.02 (ref 1.005–1.030)
Urobilinogen, Ur: 0.2 mg/dL (ref 0.2–1.0)

## 2016-08-05 MED ORDER — TAMSULOSIN HCL 0.4 MG PO CAPS: 0.4000 mg | ORAL_CAPSULE | Freq: Every day | ORAL | 3 refills | Status: DC

## 2016-08-05 NOTE — Progress Notes (Signed)
8:42 PM   [name] 1961-07-09 [id number]  Referring provider: Chevis Pretty, [place], [place] [address]  Chief Complaint  Patient presents with  . Results    CT    HPI: Patient is a 55 year old Caucasian male who presents today to discuss his CT results.    Background history Patient presented complaining of a 3 to 4 week history of scrotal pain.  Patient has a remote history of hypogonadism and nephrolithiasis, BPH with LUTS, erectile dysfunction.  A scrotal ultrasound performed earlier this year demonstrated a small right hydrocele and a mild left varicocele.  No testicular abnormality.  He states the scrotum does swell intermittently.  He is not having dysuria, penile discharge or other urinary symptoms.  He describes the pain as throbbing.  He has found Mobic helps to ease the pain.  He has also been having dark urine with a foul odor.  He has not had flank pain, fevers, chills, nausea or vomiting.   He has not had any recent imaging studies or passage of fragments.    Patient admits to not keeping hydrated while working outside.  The malodorous urine happens after he is outside.    He was given a trial of gabapentin for the scrotal pain.  It was not effective in controlling the pain.  He states the pain wakes him from sleep and he finds relief when he empties his bladder.  He also finds his brother's Percocet helpful in controlling the pain.    He has not had fevers, chills, nausea or vomiting.    CT Renal scan study revealed no acute findings to explain the patient's pain.  I have personally reviewed the films.  UA was unremarkable.    PMH: Past Medical History:  Diagnosis Date  . Arthritis   . Chronic back pain    stenosis  . Constipation    with pain meds  . Depression    takes Zoloft daily  . Diabetes mellitus without complication ([place])    takes Metformni daily  . Dizziness   . GERD (gastroesophageal reflux disease)    takes  Nexium daily  . Headache(784.0)    occasionally  . Hyperlipidemia    takes Zetia daily  . Hypertension    takes Benicar daily  . Impingement syndrome of left shoulder   . Insomnia    takes Ambien nightly  . Joint pain   . Left rotator cuff tear   . Pneumonia 1986   walking  . Seasonal allergies    takes Claritin daily  . Tear of medial meniscus of right knee   . Urinary frequency    takes Flomax daily  . Urinary urgency     Surgical History: Past Surgical History:  Procedure Laterality Date  . BACK SURGERY  [id number]   lumb fusion  . COLONOSCOPY    . KNEE ARTHROSCOPY WITH MEDIAL MENISECTOMY Right 09/26/2013   Procedure: RIGHT KNEE ARTHROSCOPY WITH PARTIAL MEDIAL and lateral chrondroplasty MENISECTOMY;  Surgeon: Lorn Junes, MD;  Location: [facility];  Service: Orthopedics;  Laterality: Right;  . left elbow surgery    . LITHOTRIPSY    . NASAL SEPTUM SURGERY    . removal of kidney stones  1981   lt open removal  . SHOULDER ARTHROSCOPY WITH ROTATOR CUFF REPAIR AND SUBACROMIAL DECOMPRESSION Left 09/26/2013   Procedure: LEFT SHOULDER ARTHROSCOPY WITH DEBRIDEMENT, PARTIAL ROTATOR CUFF REPAIR AND SUBACROMIAL DECOMPRESSION AND SAD ACD DISTAL CLAVICULECTOMY;  Surgeon: Lorn Junes, MD;  Location: [facility];  Service: Orthopedics;  Laterality: Left;  . TONSILLECTOMY      Home Medications:    Medication List       Accurate as of 08/05/16  8:42 PM. Always use your most recent med list.          diazepam 5 MG tablet Commonly known as:  VALIUM Take 1-2 tablets (5-10 mg total) by mouth every 6 (six) hours as needed.   esomeprazole 40 MG capsule Commonly known as:  NEXIUM Take 1 capsule (40 mg total) by mouth daily at 12 noon.   ezetimibe 10 MG tablet Commonly known as:  ZETIA Take 1 tablet (10 mg total) by mouth daily.   fish oil-omega-3 fatty acids 1000 MG capsule Take 1 g by mouth 2 (two) times daily.   fluticasone 50 MCG/ACT  nasal spray Commonly known as:  FLONASE USE 1 SPRAY IN EACH NOSTRIL ONCE DAILY   gabapentin 300 MG capsule Commonly known as:  NEURONTIN Take 1 capsule (300 mg total) by mouth at bedtime.   gabapentin 300 MG capsule Commonly known as:  NEURONTIN Take 1 capsule (300 mg total) by mouth at bedtime.   glucose blood test strip Commonly known as:  ONE TOUCH ULTRA TEST Test 1 x per day and prn-- dx 250.01   hydrocortisone 2.5 % rectal cream Commonly known as:  ANUSOL-HC Place 1 application rectally 2 (two) times daily.   loratadine 10 MG tablet Commonly known as:  CLARITIN TAKE ONE (1) TABLET EACH DAY   meloxicam 15 MG tablet Commonly known as:  MOBIC Reported on 01/02/2016   metFORMIN 1000 MG tablet Commonly known as:  GLUCOPHAGE Take 1 tablet (1,000 mg total) by mouth 2 (two) times daily with a meal.   multivitamin with minerals Tabs tablet Take 1 tablet by mouth daily.   olmesartan-hydrochlorothiazide 40-25 MG tablet Commonly known as:  BENICAR HCT TAKE ONE (1) TABLET EACH DAY   olopatadine 0.1 % ophthalmic solution Commonly known as:  PATANOL INSTILL ONE DROP IN BOTH EYES TWICE DAILY   oxyCODONE-acetaminophen 10-325 MG tablet Commonly known as:  PERCOCET 1-2 tablets every 4-6 hrs as needed for pain   sertraline 100 MG tablet Commonly known as:  ZOLOFT 2 po qd   simvastatin 40 MG tablet Commonly known as:  ZOCOR Take 1 tablet (40 mg total) by mouth at bedtime.   tadalafil 5 MG tablet Commonly known as:  CIALIS Take 1 tablet (5 mg total) by mouth daily as needed for erectile dysfunction.   tamsulosin 0.4 MG Caps capsule Commonly known as:  FLOMAX Take 1 capsule (0.4 mg total) by mouth daily.   zolpidem 10 MG tablet Commonly known as:  AMBIEN Take 1 tablet (10 mg total) by mouth at bedtime as needed.       Allergies:  Allergies  Allergen Reactions  . Codeine     anxiety  . Lipitor [Atorvastatin]     Myalgia    Family History: Family History    Problem Relation Age of Onset  . COPD Mother   . Hypertension Father   . Alzheimer's disease Father   . Diabetes Maternal Grandmother   . Kidney disease Neg Hx   . Prostate cancer Neg Hx     Social History:  reports that he has never smoked. He does not have any smokeless tobacco history on file. He reports that he drinks alcohol. He reports that he does not use drugs.  ROS: UROLOGY Frequent Urination?: No Hard to postpone urination?: No Burning/pain with urination?: No Get up at night to urinate?: Yes Leakage of urine?: No Urine stream starts and stops?: Yes Trouble starting stream?: Yes Do you have to strain to urinate?: No Blood in urine?: No Urinary tract infection?: No Sexually transmitted disease?: No Injury to kidneys or bladder?: No Painful intercourse?: No Weak stream?: No Erection problems?: No Penile pain?: No  Gastrointestinal Nausea?: No Vomiting?: No Indigestion/heartburn?: No Diarrhea?: No Constipation?: No  Constitutional Fever: No Night sweats?: No Weight loss?: No Fatigue?: No  Skin Skin rash/lesions?: No Itching?: No  Eyes Blurred vision?: No Double vision?: No  Ears/Nose/Throat Sore throat?: No Sinus problems?: No  Hematologic/Lymphatic Swollen glands?: No Easy bruising?: No  Cardiovascular Leg swelling?: No Chest pain?: No  Respiratory Cough?: No Shortness of breath?: No  Endocrine Excessive thirst?: No  Musculoskeletal Back pain?: No Joint pain?: No  Neurological Headaches?: No Dizziness?: No  Psychologic Depression?: No Anxiety?: No  Physical Exam: BP 107/70   Pulse 67   Ht 5\' 10"  (1.778 m)   Wt 228 lb 3.2 oz (103.5 kg)   BMI 32.74 kg/m   Constitutional: Well nourished. Alert and oriented, No acute distress. HEENT: [place] AT, moist mucus membranes. Trachea midline, no masses. Cardiovascular: No clubbing, cyanosis, or edema. Respiratory: Normal respiratory effort, no increased work of breathing. Skin:  No rashes, bruises or suspicious lesions. Lymph: No cervical or inguinal adenopathy. Neurologic: Grossly intact, no focal deficits, moving all 4 extremities. Psychiatric: Normal mood and affect.  Laboratory Data: Lab Results  Component Value Date   WBC 8.8 12/22/2012   HGB 14.1 09/26/2013   HCT 42.0 12/22/2012   MCV 91.9 12/22/2012   PLT 246 12/22/2012    Lab Results  Component Value Date   CREATININE 1.18 06/21/2016    Lab Results  Component Value Date   PSA 0.7 02/25/2015  PSA   1.0 ng/mL on 12/18/2015  Lab Results  Component Value Date   HGBA1C 6.6 11/20/2015   Pertinent images CLINICAL DATA:  Right groin pain for 1/2 months.  EXAM: CT ABDOMEN AND PELVIS WITHOUT CONTRAST  TECHNIQUE: Multidetector CT imaging of the abdomen and pelvis was performed following the standard protocol without IV contrast.  COMPARISON:  Testicular ultrasound 12/29/2015. CT abdomen pelvis 12/16/2011.  FINDINGS: Lower chest: Lung bases show two 4 mm nodules in the right middle lobe. Heart size normal. No pericardial or pleural effusion.  Hepatobiliary: Liver is decreased in attenuation diffusely with sparing along the gallbladder fossa. Liver and gallbladder are otherwise unremarkable. No biliary ductal dilatation.  Pancreas: Negative.  Spleen: Negative.  Adrenals/Urinary Tract: Adrenal glands are unremarkable. Low-attenuation lesions in the kidneys measure up to 1.6 cm on the right and are likely cysts although definitive characterization is limited without post-contrast imaging. Ureters are decompressed. Bladder is unremarkable.  Stomach/Bowel: Stomach, small bowel, appendix and colon are unremarkable.  Vascular/Lymphatic: Atherosclerotic calcification of the arterial vasculature without abdominal aortic aneurysm. No pathologically enlarged lymph nodes.  Reproductive: Prostate is visualized.  Other: Small right inguinal hernia contains fat. No free fluid.  Tiny periumbilical hernia contains fat. Mesenteries and peritoneum are unremarkable.  Musculoskeletal: No worrisome lytic or sclerotic lesions. Postoperative changes in the lumbar spine.  IMPRESSION: 1. No acute findings to explain the patient's pain. 2. Two nodules in the right middle lobe, nonspecific. No follow-up needed if patient is low-risk (and has no known or suspected primary neoplasm). Non-contrast chest CT can be considered in 12 months  if patient is high-risk. This recommendation follows the consensus statement: Guidelines for Management of Incidental Pulmonary Nodules Detected on CT Images:From the Fleischner Society 2017; published online before print (10.1148/radiol.IJ:2314499). 3. Hepatic steatosis. 4. Aortic atherosclerosis.   Electronically Signed   By: Lorin Picket M.D.   On: 07/19/2016 15:59  Assessment & Plan:    1. Right groin pain  - patient's scrotal exam was unremarkable   - CT renal scan study did not identify an etiology for his scrotal pain  - gabapentin ineffective  - worsened when bladder is full  - trial of tamsulosin  - RTC in one month for symptom recheck  - UA unremarkable  2. Right hydrocele:    I reassured the patient that this is a benign finding. I discussed conservative management of the hydrocele with the patient. We will continue to monitor.   3. Left varicocele:   I reassured the patient that this is a benign finding. I discussed conservative management of the varicocele with the patient. We will continue to monitor.     4. BPH with LUTS  - Continue conservative management, avoiding bladder irritants and timed voiding's  - Initiate alpha-blocker (tamsulosin), discussed side effects  - RTC in one month for IPSS, PVR and exam   5. Erectile dysfunction:   Not addressed at this visit.    6. History of nephrolithiasis:   No stones seen on CT Renal scan.     8. History of hypogonadism:   Not addressed at this visit.      Return in about 1 month (around 09/05/2016) for IPSS, PVR and exam.  Zara Council, [facility]  [facility] [address], [place], [place] [address] [phone]

## 2016-08-05 NOTE — Telephone Encounter (Signed)
Please let the patient know that his UA was negative and I would like to see him in one month for IPSS, PVR and exam.

## 2016-08-06 NOTE — Telephone Encounter (Signed)
No answer

## 2016-08-09 NOTE — Telephone Encounter (Signed)
Spoke with pt in reference to u/a and 27mo f/u. Pt voiced understanding. Pt was transferred to the front to make f/u appt.

## 2016-08-09 NOTE — Telephone Encounter (Signed)
LMOM

## 2016-08-25 IMAGING — US US SCROTUM
1 series · 14 of 25 positions shown · non-contrast
Comparison: None.

CLINICAL DATA: Scrotal mass on the left for 3 weeks.

EXAM:
SCROTAL ULTRASOUND
DOPPLER ULTRASOUND OF THE TESTICLES
TECHNIQUE: Complete ultrasound examination of the testicles, epididymis, and
other scrotal structures was performed. Color and spectral Doppler
ultrasound were also utilized to evaluate blood flow to the
testicles.

[Series 1: us scrotum · 0.07mm/px · 14 of 81 slices shown]
[im 1/81]
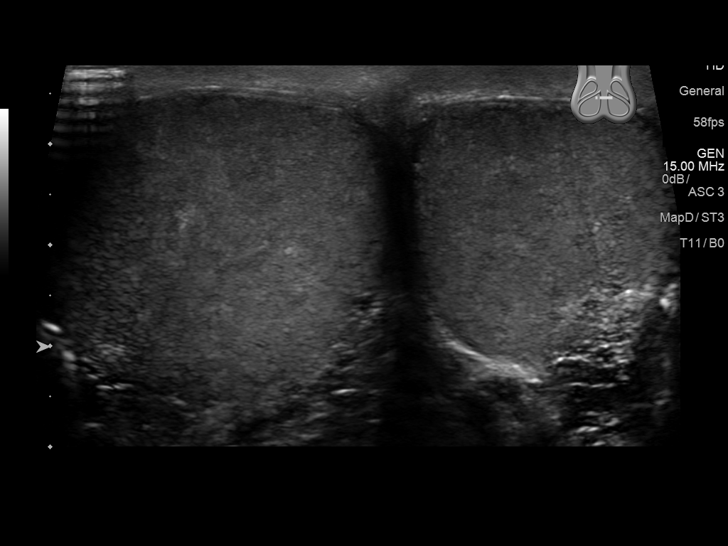
[im 7/81]
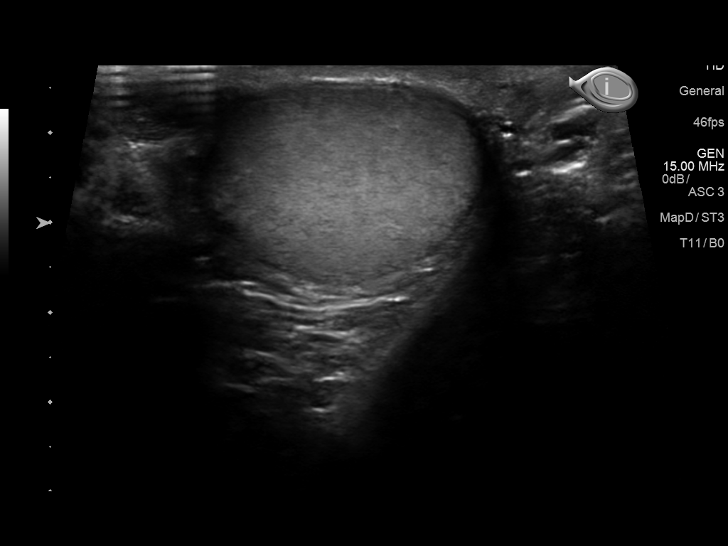
[im 14/81]
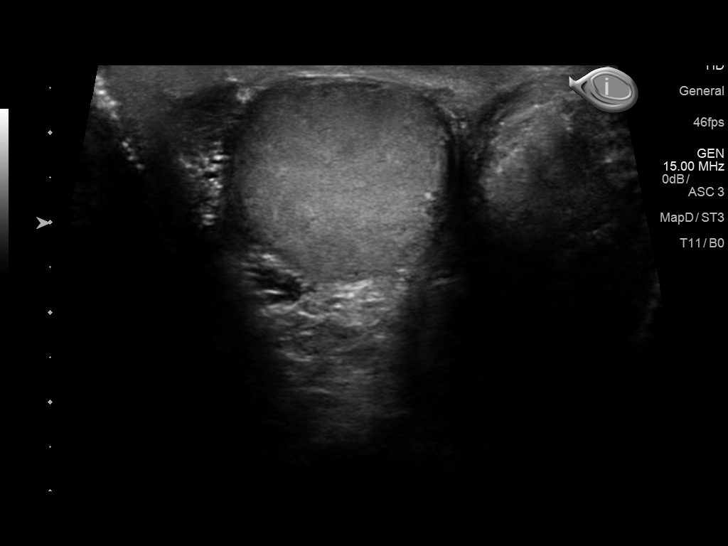
[im 21/81]
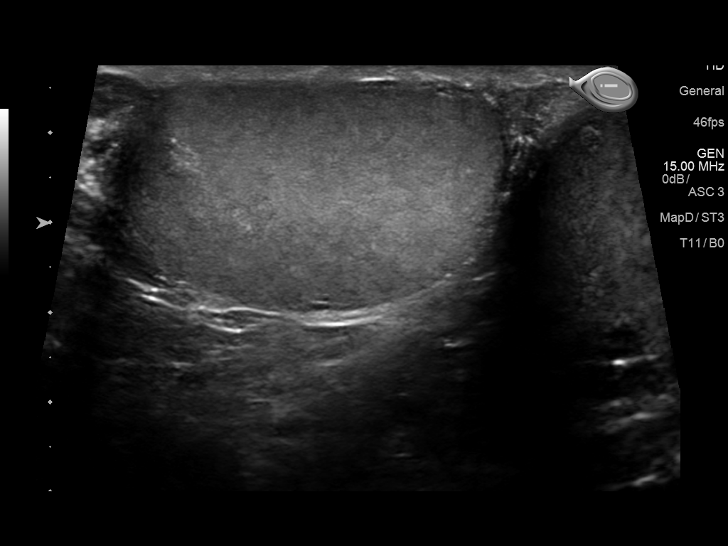
[im 27/81]
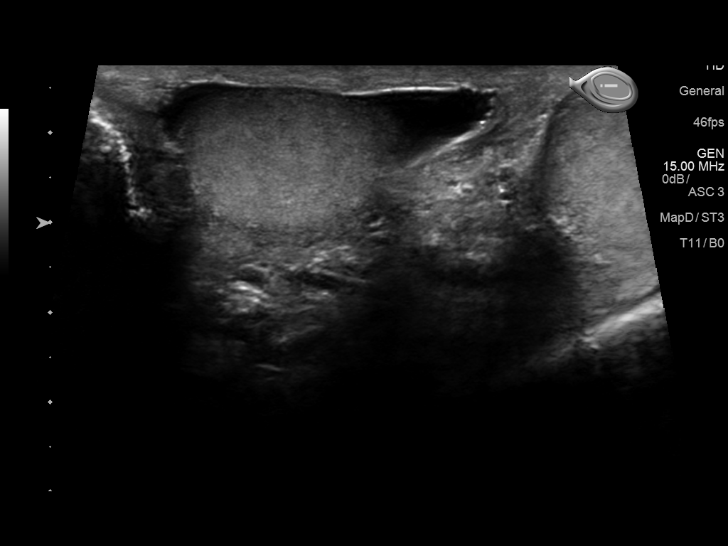
[im 31/81]
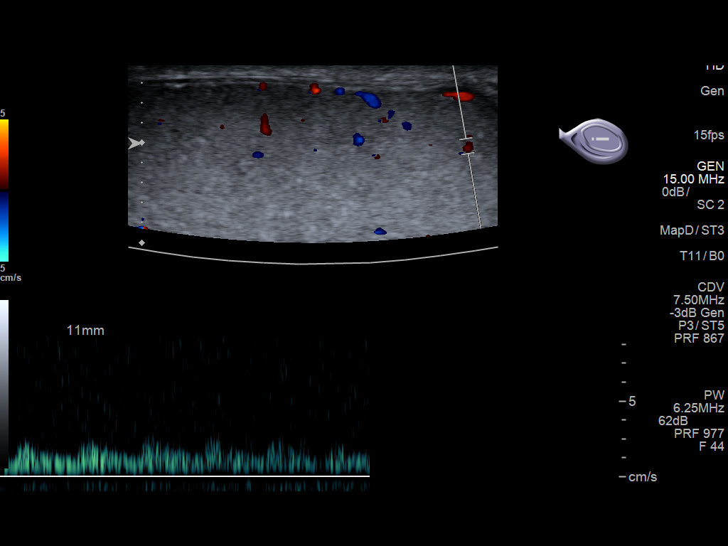
[im 37/81]
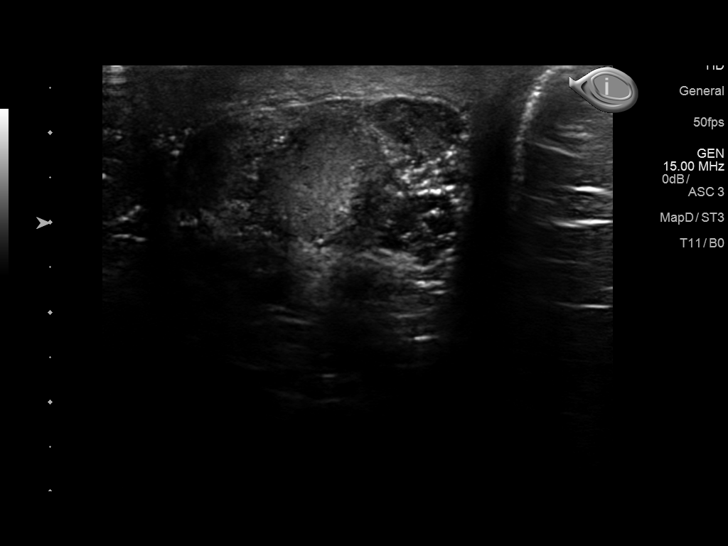
[im 44/81]
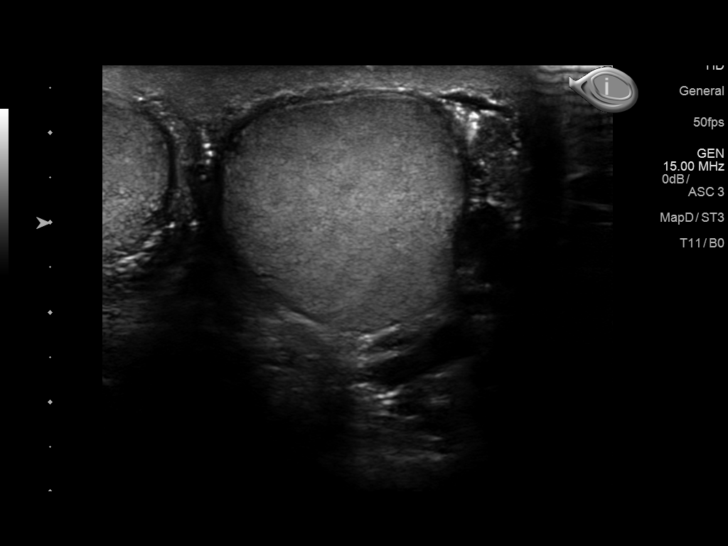
[im 51/81]
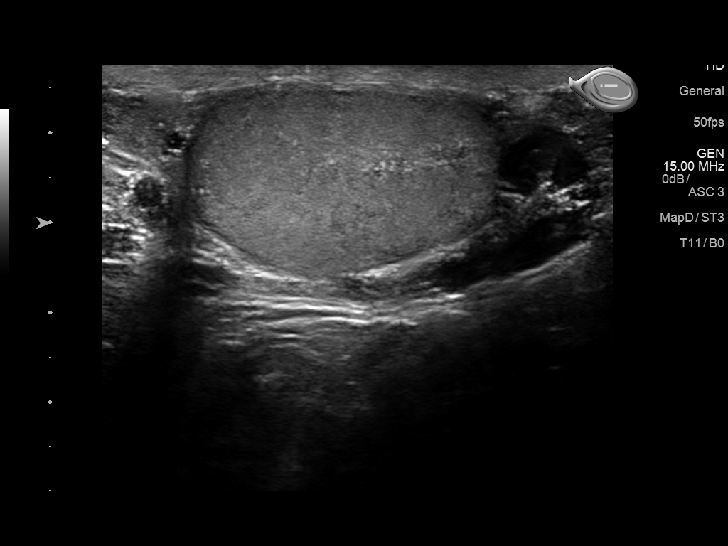
[im 54/81]
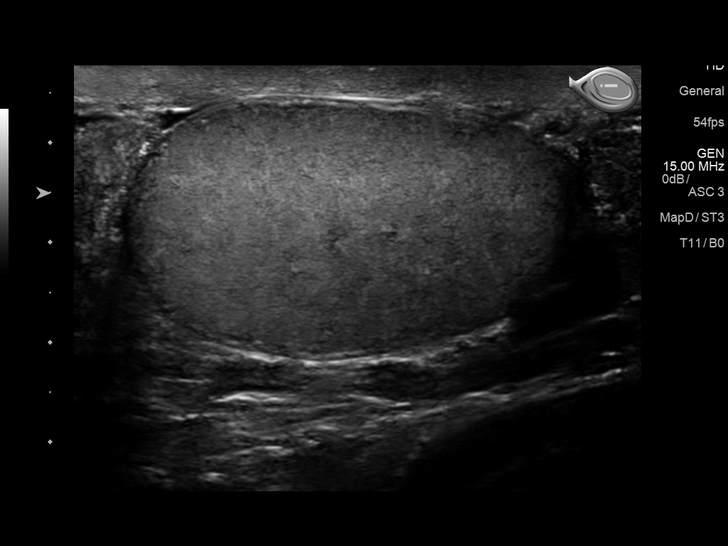
[im 61/81]
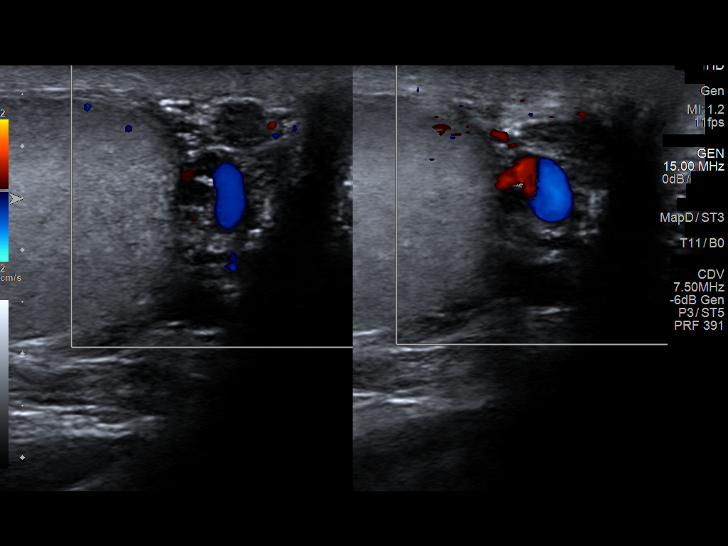
[im 67/81]
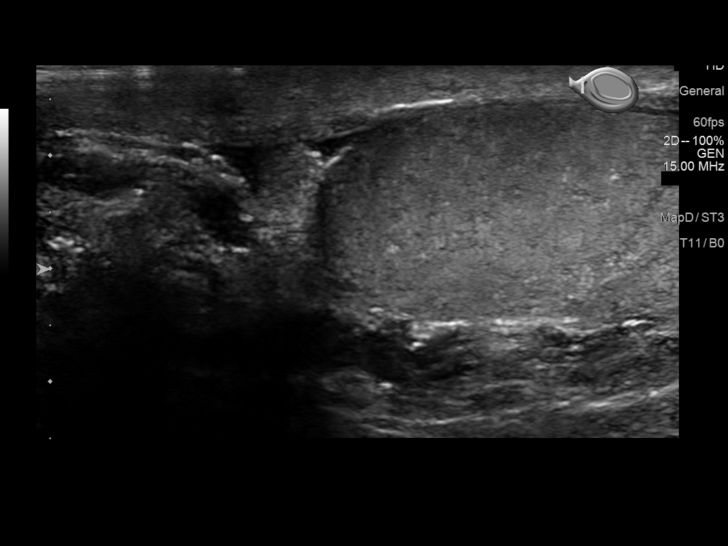
[im 74/81]
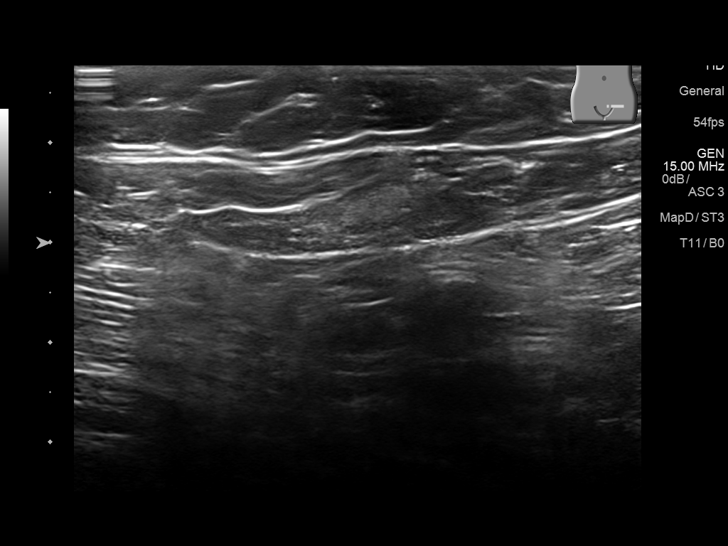
[im 81/81]
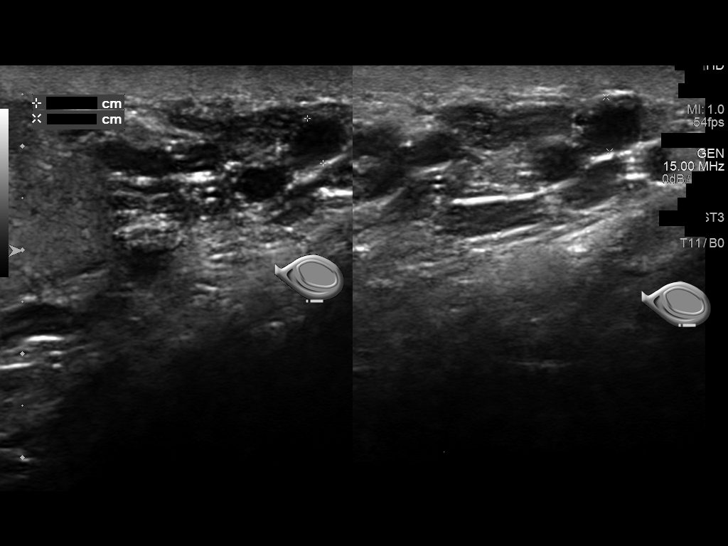

[14 of 25 positions shown; findings below may reference images not displayed]

FINDINGS: Right testicle

Measurements: 4.6 x 2.7 x 3.4 cm. No mass or microlithiasis
visualized.

Left testicle

Measurements: 4.3 x 2.3 x 2.8 cm. No mass or microlithiasis
visualized.

Right epididymis:  Normal in size and appearance.

Left epididymis:  Normal in size and appearance.

Hydrocele:  Small right hydrocele

Varicocele:  Mild left varicocele

Pulsed Doppler interrogation of both testes demonstrates normal low
resistance arterial and venous waveforms bilaterally.
IMPRESSION: No testicular abnormality.

Small right hydrocele and mild left varicocele.

## 2016-09-06 ENCOUNTER — Other Ambulatory Visit: Payer: Self-pay | Admitting: Nurse Practitioner

## 2016-09-06 ENCOUNTER — Other Ambulatory Visit: Payer: Self-pay

## 2016-09-06 DIAGNOSIS — G4700 Insomnia, unspecified: Secondary | ICD-10-CM

## 2016-09-06 MED ORDER — ZOLPIDEM TARTRATE 10 MG PO TABS: 10.0000 mg | ORAL_TABLET | Freq: Every evening | ORAL | 1 refills | Status: DC | PRN

## 2016-09-06 NOTE — Telephone Encounter (Signed)
Please call in [place] with 1 refillsambien

## 2016-09-09 ENCOUNTER — Ambulatory Visit: Payer: Commercial Managed Care - HMO | Admitting: Urology

## 2016-09-16 ENCOUNTER — Ambulatory Visit: Payer: Commercial Managed Care - HMO | Admitting: Urology

## 2016-09-17 ENCOUNTER — Encounter: Payer: Self-pay | Admitting: Urology

## 2016-10-05 ENCOUNTER — Other Ambulatory Visit: Payer: Self-pay | Admitting: Nurse Practitioner

## 2016-10-11 ENCOUNTER — Ambulatory Visit (INDEPENDENT_AMBULATORY_CARE_PROVIDER_SITE_OTHER): Payer: Commercial Managed Care - HMO | Admitting: Nurse Practitioner

## 2016-10-11 ENCOUNTER — Encounter: Payer: Self-pay | Admitting: Nurse Practitioner

## 2016-10-11 VITALS — BP 92/63 | HR 77 | Temp 97.2°F | Ht 70.0 in | Wt 229.0 lb

## 2016-10-11 DIAGNOSIS — F3342 Major depressive disorder, recurrent, in full remission: Secondary | ICD-10-CM | POA: Diagnosis not present

## 2016-10-11 DIAGNOSIS — K219 Gastro-esophageal reflux disease without esophagitis: Secondary | ICD-10-CM

## 2016-10-11 DIAGNOSIS — M48062 Spinal stenosis, lumbar region with neurogenic claudication: Secondary | ICD-10-CM

## 2016-10-11 DIAGNOSIS — G629 Polyneuropathy, unspecified: Secondary | ICD-10-CM

## 2016-10-11 DIAGNOSIS — E119 Type 2 diabetes mellitus without complications: Secondary | ICD-10-CM

## 2016-10-11 DIAGNOSIS — N401 Enlarged prostate with lower urinary tract symptoms: Secondary | ICD-10-CM

## 2016-10-11 DIAGNOSIS — N529 Male erectile dysfunction, unspecified: Secondary | ICD-10-CM

## 2016-10-11 DIAGNOSIS — Z23 Encounter for immunization: Secondary | ICD-10-CM

## 2016-10-11 DIAGNOSIS — I10 Essential (primary) hypertension: Secondary | ICD-10-CM

## 2016-10-11 DIAGNOSIS — N138 Other obstructive and reflux uropathy: Secondary | ICD-10-CM

## 2016-10-11 DIAGNOSIS — E785 Hyperlipidemia, unspecified: Secondary | ICD-10-CM

## 2016-10-11 DIAGNOSIS — F5101 Primary insomnia: Secondary | ICD-10-CM | POA: Diagnosis not present

## 2016-10-11 DIAGNOSIS — M5442 Lumbago with sciatica, left side: Secondary | ICD-10-CM | POA: Diagnosis not present

## 2016-10-11 DIAGNOSIS — M5441 Lumbago with sciatica, right side: Secondary | ICD-10-CM

## 2016-10-11 DIAGNOSIS — G8929 Other chronic pain: Secondary | ICD-10-CM

## 2016-10-11 LAB — BAYER DCA HB A1C WAIVED: HB A1C: 6.6 % (ref <7.0)

## 2016-10-11 MED ORDER — GABAPENTIN 300 MG PO CAPS: 300.0000 mg | ORAL_CAPSULE | Freq: Two times a day (BID) | ORAL | 12 refills | Status: DC

## 2016-10-11 MED ORDER — ZOLPIDEM TARTRATE 10 MG PO TABS: 10.0000 mg | ORAL_TABLET | Freq: Every evening | ORAL | 1 refills | Status: DC | PRN

## 2016-10-11 NOTE — Patient Instructions (Signed)
Diabetes and Foot Care Diabetes may cause you to have problems because of poor blood supply (circulation) to your feet and legs. This may cause the skin on your feet to become thinner, break easier, and heal more slowly. Your skin may become dry, and the skin may peel and crack. You may also have nerve damage in your legs and feet causing decreased feeling in them. You may not notice minor injuries to your feet that could lead to infections or more serious problems. Taking care of your feet is one of the most important things you can do for yourself.  HOME CARE INSTRUCTIONS  Wear shoes at all times, even in the house. Do not go barefoot. Bare feet are easily injured.  Check your feet daily for blisters, cuts, and redness. If you cannot see the bottom of your feet, use a mirror or ask someone for help.  Wash your feet with warm water (do not use hot water) and mild soap. Then pat your feet and the areas between your toes until they are completely dry. Do not soak your feet as this can dry your skin.  Apply a moisturizing lotion or petroleum jelly (that does not contain alcohol and is unscented) to the skin on your feet and to dry, brittle toenails. Do not apply lotion between your toes.  Trim your toenails straight across. Do not dig under them or around the cuticle. File the edges of your nails with an emery board or nail file.  Do not cut corns or calluses or try to remove them with medicine.  Wear clean socks or stockings every day. Make sure they are not too tight. Do not wear knee-high stockings since they may decrease blood flow to your legs.  Wear shoes that fit properly and have enough cushioning. To break in new shoes, wear them for just a few hours a day. This prevents you from injuring your feet. Always look in your shoes before you put them on to be sure there are no objects inside.  Do not cross your legs. This may decrease the blood flow to your feet.  If you find a minor scrape,  cut, or break in the skin on your feet, keep it and the skin around it clean and dry. These areas may be cleansed with mild soap and water. Do not cleanse the area with peroxide, alcohol, or iodine.  When you remove an adhesive bandage, be sure not to damage the skin around it.  If you have a wound, look at it several times a day to make sure it is healing.  Do not use heating pads or hot water bottles. They may burn your skin. If you have lost feeling in your feet or legs, you may not know it is happening until it is too late.  Make sure your health care provider performs a complete foot exam at least annually or more often if you have foot problems. Report any cuts, sores, or bruises to your health care provider immediately. SEEK MEDICAL CARE IF:   You have an injury that is not healing.  You have cuts or breaks in the skin.  You have an ingrown nail.  You notice redness on your legs or feet.  You feel burning or tingling in your legs or feet.  You have pain or cramps in your legs and feet.  Your legs or feet are numb.  Your feet always feel cold. SEEK IMMEDIATE MEDICAL CARE IF:   There is increasing redness,   swelling, or pain in or around a wound.  There is a red line that goes up your leg.  Pus is coming from a wound.  You develop a fever or as directed by your health care provider.  You notice a bad smell coming from an ulcer or wound.   This information is not intended to replace advice given to you by your health care provider. Make sure you discuss any questions you have with your health care provider.   Document Released: 11/26/2000 Document Revised: 08/01/2013 Document Reviewed: 05/08/2013 Elsevier Interactive Patient Education 2016 [employer]  

## 2016-10-11 NOTE — Progress Notes (Signed)
 Subjective:    Patient ID: [name], male    DOB: 07/20/1961, 55 y.o.   MRN: [id number]   Patient here today for follow up of chronic medical problems. No complaints today.  Outpatient Encounter Prescriptions as of 06/21/2016  Medication Sig  . diazepam (VALIUM) 5 MG tablet Take 1-2 tablets (5-10 mg total) by mouth every 6 (six) hours as needed.  . esomeprazole (NEXIUM) 40 MG capsule TAKE ONE (1) CAPSULE EACH DAY  . ezetimibe (ZETIA) 10 MG tablet Take 1 tablet (10 mg total) by mouth daily.  . fish oil-omega-3 fatty acids 1000 MG capsule Take 1 g by mouth 2 (two) times daily.  . fluticasone (FLONASE) 50 MCG/ACT nasal spray USE 1 SPRAY IN EACH NOSTRIL ONCE DAILY  . gabapentin (NEURONTIN) 300 MG capsule TAKE ONE CAPSULE BY MOUTH AT BEDTIME  . glucose blood (ONE TOUCH ULTRA TEST) test strip Test 1 x per day and prn-- dx 250.01  . hydrocortisone (ANUSOL-HC) 2.5 % rectal cream Place 1 application rectally 2 (two) times daily.  . loratadine (CLARITIN) 10 MG tablet TAKE ONE (1) TABLET EACH DAY  . meloxicam (MOBIC) 15 MG tablet Reported on 01/02/2016  . metFORMIN (GLUCOPHAGE) 1000 MG tablet Take 1 tablet (1,000 mg total) by mouth 2 (two) times daily with a meal.  . Multiple Vitamin (MULTIVITAMIN WITH MINERALS) TABS Take 1 tablet by mouth daily.  . olmesartan-hydrochlorothiazide (BENICAR HCT) 40-25 MG tablet TAKE ONE (1) TABLET EACH DAY  . olopatadine (PATANOL) 0.1 % ophthalmic solution INSTILL ONE DROP IN BOTH EYES TWICE DAILY  . oxyCODONE-acetaminophen (PERCOCET) 10-325 MG per tablet 1-2 tablets every 4-6 hrs as needed for pain  . sertraline (ZOLOFT) 100 MG tablet 2 po qd  . simvastatin (ZOCOR) 40 MG tablet Take 1 tablet (40 mg total) by mouth at bedtime.  . tadalafil (CIALIS) 5 MG tablet Take 1 tablet (5 mg total) by mouth daily as needed for erectile dysfunction.  . tamsulosin (FLOMAX) 0.4 MG CAPS capsule TAKE ONE (1) CAPSULE EACH DAY  . zolpidem (AMBIEN) 10 MG tablet TAKE ONE TABLET AT  BEDTIME AS NEEDED        Hyperglycemia  This is a chronic problem. The current episode started more than 1 year ago. The problem has been waxing and waning. Associated symptoms include diaphoresis (Some night sweats recently ). Pertinent negatives include no chest pain, chills, headaches, nausea, neck pain or weakness. Associated symptoms comments: Tingling in bilateral feet. . Nothing aggravates the symptoms. He has tried rest, position changes, lying down, walking and sleep for the symptoms. The treatment provided significant relief.  Hypertension  This is a chronic problem. The current episode started more than 1 year ago. The problem is unchanged. The problem is controlled. Pertinent negatives include no chest pain, headaches, neck pain, palpitations, peripheral edema or shortness of breath. There are no associated agents to hypertension. Risk factors for coronary artery disease include dyslipidemia, diabetes mellitus, male gender and obesity. Past treatments include angiotensin blockers and diuretics. The current treatment provides significant improvement. Compliance problems include diet and exercise.   Hyperlipidemia  This is a chronic problem. The current episode started more than 1 year ago. The problem is uncontrolled. Recent lipid tests were reviewed and are high. Exacerbating diseases include diabetes and obesity. He has no history of hypothyroidism. Factors aggravating his hyperlipidemia include thiazides. Pertinent negatives include no chest pain or shortness of breath. Current antihyperlipidemic treatment includes statins and ezetimibe. The current treatment provides moderate improvement of lipids.   Compliance problems include adherence to diet and adherence to exercise.  Risk factors for coronary artery disease include dyslipidemia, family history, hypertension and male sex.  Diabetes  He presents for his follow-up diabetic visit. He has type 2 diabetes mellitus. No MedicAlert  identification noted. There are no hypoglycemic associated symptoms. Pertinent negatives for hypoglycemia include no headaches. Associated symptoms include foot paresthesias (bilateral foot tingling last month). Pertinent negatives for diabetes include no chest pain and no weakness. There are no hypoglycemic complications. Risk factors for coronary artery disease include dyslipidemia, diabetes mellitus, hypertension, male sex and obesity. Current diabetic treatment includes oral agent (monotherapy). He is compliant with treatment most of the time. His weight is stable. When asked about meal planning, he reported none. He has not had a previous visit with a dietitian. His breakfast blood glucose is taken between 8-9 am. His breakfast blood glucose range is generally 130-140 mg/dl. His overall blood glucose range is 130-140 mg/dl. (Does not check very often) An ACE inhibitor/angiotensin II receptor blocker is being taken. He does not see a podiatrist.Eye exam is not current.  GERD nexium works well- no symptoms when takes med Depression He reports he weaned himself off of the Zoloft-last dose 01/09/2015 and has been feeling well since. Feels very positive about his situation.  insomnia ambien helps him rest well without side effects Back pain/lumbar stenosis He states he always has baseline back pain, but will only take valium and pain medications after increased activity and pain. Only takes percocet as needed- takes maybe 2-3 times a month. Has started to see chiropractor. He would like to try gabapentin- says pain is nerve pain and goes down both legs- worse on right then left. BPH flomax was helping with his symptoms but does not seem as effective in the last month.  ED cialis works well without side effects  Review of Systems  Constitutional: Positive for diaphoresis (Some night sweats recently ). Negative for chills and unexpected weight change.  Eyes: Negative for visual disturbance.    Respiratory: Negative for chest tightness and shortness of breath.   Cardiovascular: Negative for chest pain and palpitations.  Gastrointestinal: Negative for nausea.  Musculoskeletal: Negative for neck pain.  Neurological: Negative for weakness and headaches.  All other systems reviewed and are negative.      Objective:   Physical Exam  Constitutional: He is oriented to person, place, and time. He appears well-developed and well-nourished.  HENT:  Head: Normocephalic.  Right Ear: External ear normal.  Left Ear: External ear normal.  Nose: Nose normal.  Mouth/Throat: Oropharynx is clear and moist.  Eyes: EOM are normal. Pupils are equal, round, and reactive to light.  Neck: Normal range of motion. Neck supple. No JVD present. No thyromegaly present.  Cardiovascular: Normal rate, regular rhythm, normal heart sounds and intact distal pulses.  Exam reveals no gallop and no friction rub.   No murmur heard. Pulmonary/Chest: Effort normal and breath sounds normal. No respiratory distress. He has no wheezes. He has no rales. He exhibits no tenderness.  Abdominal: Soft. Bowel sounds are normal. He exhibits no mass. There is no tenderness.  Genitourinary:  Genitourinary Comments: Prostate exam deferred to urology  Musculoskeletal: Normal range of motion. He exhibits no edema.  Decrease ROM of lumbar spine due to pain on flexion and extension (-) SLR bil Motor strength and sensation distally intact  Lymphadenopathy:    He has no cervical adenopathy.  Neurological: He is alert and oriented to person, place, and time.   No cranial nerve deficit.  Skin: Skin is warm and dry.  Psychiatric: He has a normal mood and affect. His behavior is normal. Judgment and thought content normal.   BP 92/63   Pulse 77   Temp 97.2 F (36.2 C) (Oral)   Ht _0  (1.778 m)   Wt 229 lb (103.9 kg)   BMI 32.86 kg/m    hgba1c 6.6% down from 7.6%       Assessment & Plan:  1. Essential  hypertension Low sodium diet - CMP14+EGFR  2. Type 2 diabetes mellitus without complication, without long-term current use of insulin (HCC) Continue to watch carbs in diet - Bayer DCA Hb A1c Waived  3. Hyperlipidemia with target LDL less than 100 Low fat diet - Lipid panel  4. Gastroesophageal reflux disease without esophagitis Avoid spicy foods Do not eat 2 hours prior to bedtime  5. Neuropathy ([place]) Do not go barefooted  6. BPH with obstruction/lower urinary tract symptoms  7. Erectile dysfunction of organic origin  8. Chronic bilateral low back pain with bilateral sciatica moist heat to back Back stretches - gabapentin (NEURONTIN) 300 MG capsule; Take 1 capsule (300 mg total) by mouth 2 (two) times daily.  Dispense: 60 capsule; Refill: 12  9. Recurrent major depressive disorder, in full remission ([place]) Stress management  10. Primary insomnia Bedtime routine - zolpidem (AMBIEN) 10 MG tablet; Take 1 tablet (10 mg total) by mouth at bedtime as needed.  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

## 2016-10-12 LAB — LIPID PANEL
CHOLESTEROL TOTAL: 124 mg/dL (ref 100–199)
Chol/HDL Ratio: 3 ratio units (ref 0.0–5.0)
HDL: 42 mg/dL (ref >39)
LDL CALC: 61 mg/dL (ref 0–99)
TRIGLYCERIDES: 103 mg/dL (ref 0–149)
VLDL Cholesterol Cal: 21 mg/dL (ref 5–40)

## 2016-10-12 LAB — CMP14+EGFR
ALK PHOS: 32 IU/L — AB (ref 39–117)
ALT: 35 IU/L (ref 0–44)
AST: 40 IU/L (ref 0–40)
Albumin/Globulin Ratio: 2 (ref 1.2–2.2)
Albumin: 4.5 g/dL (ref 3.5–5.5)
BUN/Creatinine Ratio: 23 — ABNORMAL HIGH (ref 9–20)
BUN: 24 mg/dL (ref 6–24)
Bilirubin Total: 0.5 mg/dL (ref 0.0–1.2)
CO2: 24 mmol/L (ref 18–29)
CREATININE: 1.06 mg/dL (ref 0.76–1.27)
Calcium: 9.4 mg/dL (ref 8.7–10.2)
Chloride: 98 mmol/L (ref 96–106)
GFR calc Af Amer: 90 mL/min/{1.73_m2} (ref >59)
GFR calc non Af Amer: 78 mL/min/{1.73_m2} (ref >59)
GLOBULIN, TOTAL: 2.2 g/dL (ref 1.5–4.5)
GLUCOSE: 137 mg/dL — AB (ref 65–99)
Potassium: 4.3 mmol/L (ref 3.5–5.2)
SODIUM: 139 mmol/L (ref 134–144)
Total Protein: 6.7 g/dL (ref 6.0–8.5)

## 2016-11-03 ENCOUNTER — Other Ambulatory Visit: Payer: Self-pay | Admitting: Nurse Practitioner

## 2016-12-10 ENCOUNTER — Other Ambulatory Visit: Payer: Self-pay | Admitting: Nurse Practitioner

## 2016-12-10 DIAGNOSIS — I10 Essential (primary) hypertension: Secondary | ICD-10-CM

## 2016-12-23 ENCOUNTER — Other Ambulatory Visit: Payer: Self-pay

## 2016-12-23 DIAGNOSIS — N401 Enlarged prostate with lower urinary tract symptoms: Secondary | ICD-10-CM

## 2016-12-28 ENCOUNTER — Other Ambulatory Visit: Payer: PPO

## 2016-12-28 DIAGNOSIS — N401 Enlarged prostate with lower urinary tract symptoms: Secondary | ICD-10-CM

## 2016-12-29 LAB — PSA: Prostate Specific Ag, Serum: 0.9 ng/mL (ref 0.0–4.0)

## 2017-01-04 ENCOUNTER — Ambulatory Visit: Payer: PPO | Admitting: Urology

## 2017-01-04 ENCOUNTER — Encounter: Payer: Self-pay | Admitting: Urology

## 2017-01-04 VITALS — BP 129/81 | HR 92 | Ht 70.0 in | Wt 231.8 lb

## 2017-01-04 DIAGNOSIS — N401 Enlarged prostate with lower urinary tract symptoms: Secondary | ICD-10-CM

## 2017-01-04 DIAGNOSIS — N529 Male erectile dysfunction, unspecified: Secondary | ICD-10-CM | POA: Diagnosis not present

## 2017-01-04 DIAGNOSIS — N138 Other obstructive and reflux uropathy: Secondary | ICD-10-CM

## 2017-01-04 DIAGNOSIS — R102 Pelvic and perineal pain: Secondary | ICD-10-CM | POA: Diagnosis not present

## 2017-01-04 MED ORDER — TADALAFIL 5 MG PO TABS: 5.0000 mg | ORAL_TABLET | Freq: Every day | ORAL | 3 refills | Status: DC | PRN

## 2017-01-04 MED ORDER — TAMSULOSIN HCL 0.4 MG PO CAPS: 0.4000 mg | ORAL_CAPSULE | Freq: Every day | ORAL | 3 refills | Status: DC

## 2017-01-04 NOTE — Progress Notes (Signed)
01/04/2017 11:51 AM   [name] April 02, 1961 [id number]  Referring provider: Chevis Pretty, [place],  [id number]  Chief Complaint  Patient presents with  . Follow-up    HPI: Patient is a 56 year old Caucasian male who presents today for a follow up for erectile dysfunction and BPH with LU TS.  Erectile dysfunction His SHIM score is 16, which is mild to moderate ED.  He has been having difficulty with erections for several years.  His major complaint is lack firmness.  His libido is preserved.   His risk factors for ED are age, BPH, DM, HTN, HLD and blood pressure medications.   He denies any painful erections or curvatures with his erections.   He has tried PDE5-inhibitors in the past with mixed results.        SHIM    Row Name 01/04/17 1118         SHIM: Over the last 6 months:   How do you rate your confidence that you could get and keep an erection? Moderate     When you had erections with sexual stimulation, how often were your erections hard enough for penetration (entering your partner)? Sometimes (about half the time)     During sexual intercourse, how often were you able to maintain your erection after you had penetrated (entered) your partner? Slightly Difficult     During sexual intercourse, how difficult was it to maintain your erection to completion of intercourse? Slightly Difficult     When you attempted sexual intercourse, how often was it satisfactory for you? Very Difficult       SHIM Total Score   SHIM 16        Score: 1-7 Severe ED 8-11 Moderate ED 12-16 Mild-Moderate ED 17-21 Mild ED 22-25 No ED    BPH WITH LUTS His IPSS score today is 14, which is moderate lower urinary tract symptomatology. He is mixed with his quality life due to his urinary symptoms.  His previous IPSS score was 7/1.      His major complaint today are dysuria and nocturia.  He has had these symptoms for several years.  He denies any  dysuria, hematuria or suprapubic pain.   He currently taking tamsulosin 0.4 mg daily and tadalfil 5 mg daily.    He also denies any recent fevers, chills, nausea or vomiting.  He does not have a family history of PCa.      IPSS    Row Name 01/04/17 1100         International Prostate Symptom Score   How often have you had the sensation of not emptying your bladder? Less than half the time     How often have you had to urinate less than every two hours? Less than half the time     How often have you found you stopped and started again several times when you urinated? Less than 1 in 5 times     How often have you found it difficult to postpone urination? Less than 1 in 5 times     How often have you had a weak urinary stream? Less than half the time     How often have you had to strain to start urination? Less than 1 in 5 times     How many times did you typically get up at night to urinate? 5 Times     Total IPSS Score 14  Quality of Life due to urinary symptoms   If you were to spend the rest of your life with your urinary condition just the way it is now how would you feel about that? Mixed        Score:  1-7 Mild 8-19 Moderate 20-35 Severe  Patient is still complaining of scrotal pain and intermittent dysuria.  CT scan and scrotal ultrasound have not found a reason for his symptoms.    PMH: Past Medical History:  Diagnosis Date  . Arthritis   . Chronic back pain    stenosis  . Constipation    with pain meds  . Depression    takes Zoloft daily  . Diabetes mellitus without complication ([name])    takes Metformni daily  . Dizziness   . GERD (gastroesophageal reflux disease)    takes Nexium daily  . Headache(784.0)    occasionally  . Hyperlipidemia    takes Zetia daily  . Hypertension    takes Benicar daily  . Impingement syndrome of left shoulder   . Insomnia    takes Ambien nightly  . Joint pain   . Left rotator cuff tear   . Pneumonia 1986    walking  . Seasonal allergies    takes Claritin daily  . Tear of medial meniscus of right knee   . Urinary frequency    takes Flomax daily  . Urinary urgency     Surgical History: Past Surgical History:  Procedure Laterality Date  . BACK SURGERY  [id number]   lumb fusion  . COLONOSCOPY    . KNEE ARTHROSCOPY WITH MEDIAL MENISECTOMY Right 09/26/2013   Procedure: RIGHT KNEE ARTHROSCOPY WITH PARTIAL MEDIAL and lateral chrondroplasty MENISECTOMY;  Surgeon: Lorn Junes, MD;  Location: [place];  Service: Orthopedics;  Laterality: Right;  . left elbow surgery    . LITHOTRIPSY    . NASAL SEPTUM SURGERY    . removal of kidney stones  1981   lt open removal  . SHOULDER ARTHROSCOPY WITH ROTATOR CUFF REPAIR AND SUBACROMIAL DECOMPRESSION Left 09/26/2013   Procedure: LEFT SHOULDER ARTHROSCOPY WITH DEBRIDEMENT, PARTIAL ROTATOR CUFF REPAIR AND SUBACROMIAL DECOMPRESSION AND SAD ACD DISTAL CLAVICULECTOMY;  Surgeon: Lorn Junes, MD;  Location: [place];  Service: Orthopedics;  Laterality: Left;  . TONSILLECTOMY      Home Medications:  Allergies as of 01/04/2017      Reactions   Codeine    anxiety   Lipitor [atorvastatin]    Myalgia      Medication List       Accurate as of 01/04/17 11:51 AM. Always use your most recent med list.          diazepam 5 MG tablet Commonly known as:  VALIUM Take 1-2 tablets (5-10 mg total) by mouth every 6 (six) hours as needed.   esomeprazole 40 MG capsule Commonly known as:  NEXIUM Take 1 capsule (40 mg total) by mouth daily at 12 noon.   ezetimibe 10 MG tablet Commonly known as:  ZETIA Take 1 tablet (10 mg total) by mouth daily.   fish oil-omega-3 fatty acids 1000 MG capsule Take 1 g by mouth 2 (two) times daily.   fluticasone 50 MCG/ACT nasal spray Commonly known as:  FLONASE USE 1 SPRAY IN EACH NOSTRIL ONCE DAILY   gabapentin 300 MG capsule Commonly known as:  NEURONTIN Take 1 capsule (300 mg total)  by mouth 2 (two) times daily.   glucose blood test strip Commonly known  as:  ONE TOUCH ULTRA TEST Test 1 x per day and prn-- dx 250.01   hydrocortisone 2.5 % rectal cream Commonly known as:  ANUSOL-HC Place 1 application rectally 2 (two) times daily.   loratadine 10 MG tablet Commonly known as:  CLARITIN TAKE ONE (1) TABLET EACH DAY   meloxicam 15 MG tablet Commonly known as:  MOBIC TAKE 1 TABLET DAILY WITH FOOD   metFORMIN 1000 MG tablet Commonly known as:  GLUCOPHAGE Take 1 tablet (1,000 mg total) by mouth 2 (two) times daily with a meal.   multivitamin with minerals Tabs tablet Take 1 tablet by mouth daily.   olmesartan-hydrochlorothiazide 40-25 MG tablet Commonly known as:  BENICAR HCT TAKE ONE (1) TABLET EACH DAY   olopatadine 0.1 % ophthalmic solution Commonly known as:  PATANOL PLACE 1 DROP IN EACH EYE TWICE DAILY   oxyCODONE-acetaminophen 10-325 MG tablet Commonly known as:  PERCOCET 1-2 tablets every 4-6 hrs as needed for pain   sertraline 100 MG tablet Commonly known as:  ZOLOFT 2 po qd   simvastatin 40 MG tablet Commonly known as:  ZOCOR Take 1 tablet (40 mg total) by mouth at bedtime.   tadalafil 5 MG tablet Commonly known as:  CIALIS Take 1 tablet (5 mg total) by mouth daily as needed for erectile dysfunction.   tamsulosin 0.4 MG Caps capsule Commonly known as:  FLOMAX Take 1 capsule (0.4 mg total) by mouth daily.   zolpidem 10 MG tablet Commonly known as:  AMBIEN Take 1 tablet (10 mg total) by mouth at bedtime as needed.       Allergies:  Allergies  Allergen Reactions  . Codeine     anxiety  . Lipitor [Atorvastatin]     Myalgia    Family History: Family History  Problem Relation Age of Onset  . COPD Mother   . Hypertension Father   . Alzheimer's disease Father   . Diabetes Maternal Grandmother   . Kidney disease Neg Hx   . Prostate cancer Neg Hx     Social History:  reports that he has never smoked. He has never used  smokeless tobacco. He reports that he drinks alcohol. He reports that he does not use drugs.  ROS: UROLOGY Frequent Urination?: No Hard to postpone urination?: No Burning/pain with urination?: Yes Get up at night to urinate?: Yes Leakage of urine?: No Urine stream starts and stops?: No Trouble starting stream?: No Do you have to strain to urinate?: No Blood in urine?: No Urinary tract infection?: No Sexually transmitted disease?: No Injury to kidneys or bladder?: No Painful intercourse?: No Weak stream?: No Erection problems?: No Penile pain?: No  Gastrointestinal Nausea?: No Vomiting?: No Indigestion/heartburn?: No Diarrhea?: No Constipation?: Yes  Constitutional Fever: No Night sweats?: No Weight loss?: No Fatigue?: No  Skin Skin rash/lesions?: No Itching?: No  Eyes Blurred vision?: No Double vision?: No  Ears/Nose/Throat Sore throat?: No Sinus problems?: Yes  Hematologic/Lymphatic Swollen glands?: Yes Easy bruising?: No  Cardiovascular Leg swelling?: No Chest pain?: No  Respiratory Cough?: No Shortness of breath?: No  Endocrine Excessive thirst?: No  Musculoskeletal Back pain?: Yes Joint pain?: No  Neurological Headaches?: No Dizziness?: No  Psychologic Depression?: No Anxiety?: No  Physical Exam: BP 129/81 (BP Location: Left Arm, Patient Position: Sitting, Cuff Size: Normal)   Pulse 92   Ht 5\' 10"  (1.778 m)   Wt 231 lb 12.8 oz (105.1 kg)   BMI 33.26 kg/m   Constitutional: Well nourished. Alert and oriented, No  acute distress. HEENT: [place] AT, moist mucus membranes. Trachea midline, no masses. Cardiovascular: No clubbing, cyanosis, or edema. Respiratory: Normal respiratory effort, no increased work of breathing. GI: Abdomen is soft, non tender, non distended, no abdominal masses. Liver and spleen not palpable.  No hernias appreciated.  Stool sample for occult testing is not indicated.   GU: No CVA tenderness.  No bladder fullness  or masses.  Patient with circumcised phallus.  Urethral meatus is patent.  No penile discharge. No penile lesions or rashes. Scrotum without lesions, cysts, rashes and/or edema.  Testicles are located scrotally bilaterally. No masses are appreciated in the testicles. Left and right epididymis are normal. Rectal: Patient with  normal sphincter tone. Anus and perineum without scarring or rashes. No rectal masses are appreciated. Prostate is approximately 50 grams, no nodules are appreciated. Seminal vesicles are normal. Skin: No rashes, bruises or suspicious lesions. Lymph: No cervical or inguinal adenopathy. Neurologic: Grossly intact, no focal deficits, moving all 4 extremities. Psychiatric: Normal mood and affect.  Laboratory Data: Lab Results  Component Value Date   WBC 8.8 12/22/2012   HGB 14.1 09/26/2013   HCT 42.0 12/22/2012   MCV 91.9 12/22/2012   PLT 246 12/22/2012    Lab Results  Component Value Date   CREATININE 1.06 10/11/2016    Lab Results  Component Value Date   PSA 0.7 02/25/2015  PSA 1.0  12/18/2015 PSA 0.9  12/28/2016   Lab Results  Component Value Date   HGBA1C 6.6 11/20/2015       Component Value Date/Time   CHOL 124 10/11/2016 0821   CHOL 211 (H) 03/16/2013 1137   HDL 42 10/11/2016 0821   HDL 45 02/25/2015 1114   HDL 50 03/16/2013 1137   CHOLHDL 3.0 10/11/2016 0821   LDLCALC 61 10/11/2016 0821   LDLCALC 63 01/16/2014 1233   LDLCALC 126 (H) 03/16/2013 1137    Lab Results  Component Value Date   AST 40 10/11/2016   Lab Results  Component Value Date   ALT 35 10/11/2016    Assessment & Plan:    1. BPH with LUTS  - IPSS score is 14/3, it is worsening  - Continue conservative management, avoiding bladder irritants and timed voiding's  - Continue tamsulosin 0.4 mg daily and Cialis 5 mg daily; refills given  - RTC in 12 months for IPSS, PSA and exam   2. Erectile dysfunction  - SHIM score is 16  - Continue Cialis  - RTC in 12 months for  repeat SHIM score and exam   3. Pelvic pain  - work up has not found an etiology for his pain  - will refer to PT for further evaluation  Return in about 1 year (around 01/04/2018) for IPSS, SHIM, PSA and exam.  These notes generated with voice recognition software. I apologize for typographical errors.  Zara Council, [facility] [address] [place], [place] [address] [phone]

## 2017-01-10 ENCOUNTER — Encounter: Payer: Self-pay | Admitting: Nurse Practitioner

## 2017-01-10 DIAGNOSIS — E785 Hyperlipidemia, unspecified: Secondary | ICD-10-CM

## 2017-01-10 DIAGNOSIS — E119 Type 2 diabetes mellitus without complications: Secondary | ICD-10-CM

## 2017-01-10 DIAGNOSIS — M5442 Lumbago with sciatica, left side: Secondary | ICD-10-CM

## 2017-01-10 DIAGNOSIS — K219 Gastro-esophageal reflux disease without esophagitis: Secondary | ICD-10-CM

## 2017-01-10 DIAGNOSIS — G8929 Other chronic pain: Secondary | ICD-10-CM

## 2017-01-10 DIAGNOSIS — I10 Essential (primary) hypertension: Secondary | ICD-10-CM

## 2017-01-10 DIAGNOSIS — F5101 Primary insomnia: Secondary | ICD-10-CM

## 2017-01-10 DIAGNOSIS — M5441 Lumbago with sciatica, right side: Secondary | ICD-10-CM

## 2017-01-11 ENCOUNTER — Telehealth: Payer: Self-pay | Admitting: Nurse Practitioner

## 2017-01-11 MED ORDER — MELOXICAM 15 MG PO TABS: 15.0000 mg | ORAL_TABLET | Freq: Every day | ORAL | 1 refills | Status: DC

## 2017-01-11 MED ORDER — ZOLPIDEM TARTRATE 10 MG PO TABS: 10.0000 mg | ORAL_TABLET | Freq: Every evening | ORAL | 1 refills | Status: DC | PRN

## 2017-01-11 MED ORDER — GABAPENTIN 300 MG PO CAPS: 300.0000 mg | ORAL_CAPSULE | Freq: Two times a day (BID) | ORAL | 1 refills | Status: DC

## 2017-01-11 MED ORDER — METFORMIN HCL 1000 MG PO TABS: 1000.0000 mg | ORAL_TABLET | Freq: Two times a day (BID) | ORAL | 1 refills | Status: DC

## 2017-01-11 MED ORDER — DIAZEPAM 5 MG PO TABS: 5.0000 mg | ORAL_TABLET | Freq: Four times a day (QID) | ORAL | 1 refills | Status: DC | PRN

## 2017-01-11 MED ORDER — LORATADINE 10 MG PO TABS: ORAL_TABLET | ORAL | 1 refills | Status: AC

## 2017-01-11 MED ORDER — OLMESARTAN MEDOXOMIL-HCTZ 40-25 MG PO TABS: ORAL_TABLET | ORAL | 1 refills | Status: DC

## 2017-01-11 MED ORDER — ESOMEPRAZOLE MAGNESIUM 40 MG PO CPDR: 40.0000 mg | DELAYED_RELEASE_CAPSULE | Freq: Every day | ORAL | 1 refills | Status: DC

## 2017-01-11 MED ORDER — SIMVASTATIN 40 MG PO TABS: 40.0000 mg | ORAL_TABLET | Freq: Every day | ORAL | 1 refills | Status: DC

## 2017-01-11 MED ORDER — EZETIMIBE 10 MG PO TABS: 10.0000 mg | ORAL_TABLET | Freq: Every day | ORAL | 1 refills | Status: DC

## 2017-01-11 NOTE — Telephone Encounter (Signed)
Pt aware written Rx is at front desk ready for pickup  

## 2017-01-11 NOTE — Telephone Encounter (Signed)
Closing this encounter. Addressed in refill encounter

## 2017-01-19 ENCOUNTER — Ambulatory Visit: Payer: PPO | Attending: Urology | Admitting: Physical Therapy

## 2017-01-19 DIAGNOSIS — M791 Myalgia, unspecified site: Secondary | ICD-10-CM

## 2017-01-19 DIAGNOSIS — M6281 Muscle weakness (generalized): Secondary | ICD-10-CM | POA: Insufficient documentation

## 2017-01-19 DIAGNOSIS — R2689 Other abnormalities of gait and mobility: Secondary | ICD-10-CM | POA: Insufficient documentation

## 2017-01-19 NOTE — Patient Instructions (Signed)
  Proper body mechanics with getting out of a chair to decrease strain  on back &pelvic floor   Avoid holding your breath when Getting out of the chair:  Scoot to front part of chair chair Heels behind feet, feet are hip width apart, nose over toes  Inhale like you are smelling roses Exhale to stand    _______  [name] as you lift  _______  Mini squat when bending

## 2017-01-20 ENCOUNTER — Telehealth: Payer: Self-pay

## 2017-01-20 NOTE — Therapy (Addendum)
[place] MAIN [facility] SERVICES [address] [place], [place], [address] Phone: [phone]   Fax:  [phone]  Physical Therapy Evaluation  Patient Details  Name: [name] MRN: [id number] Date of Birth: 1961/11/14 Referring Provider: Zara Council  Encounter Date: 01/19/2017      PT End of Session - 01/20/17 2248    Visit Number 1   Number of Visits 12   Date for PT Re-Evaluation 04/13/17   PT Start Time 1100   PT Stop Time 1205   PT Time Calculation (min) 65 min   Activity Tolerance Patient tolerated treatment well;No increased pain   Behavior During Therapy WFL for tasks assessed/performed      Past Medical History:  Diagnosis Date  . Arthritis   . Chronic back pain    stenosis  . Constipation    with pain meds  . Depression    takes Zoloft daily  . Diabetes mellitus without complication ([place])    takes Metformni daily  . Dizziness   . GERD (gastroesophageal reflux disease)    takes Nexium daily  . Headache(784.0)    occasionally  . Hyperlipidemia    takes Zetia daily  . Hypertension    takes Benicar daily  . Impingement syndrome of left shoulder   . Insomnia    takes Ambien nightly  . Joint pain   . Left rotator cuff tear   . Pneumonia 1986   walking  . Seasonal allergies    takes Claritin daily  . Tear of medial meniscus of right knee   . Urinary frequency    takes Flomax daily  . Urinary urgency     Past Surgical History:  Procedure Laterality Date  . BACK SURGERY  [id number]   lumb fusion  . COLONOSCOPY    . KNEE ARTHROSCOPY WITH MEDIAL MENISECTOMY Right 09/26/2013   Procedure: RIGHT KNEE ARTHROSCOPY WITH PARTIAL MEDIAL and lateral chrondroplasty MENISECTOMY;  Surgeon: Lorn Junes, MD;  Location: [place];  Service: Orthopedics;  Laterality: Right;  . left elbow surgery    . LITHOTRIPSY    . NASAL SEPTUM SURGERY    . removal of kidney stones  1981   lt open removal  .  SHOULDER ARTHROSCOPY WITH ROTATOR CUFF REPAIR AND SUBACROMIAL DECOMPRESSION Left 09/26/2013   Procedure: LEFT SHOULDER ARTHROSCOPY WITH DEBRIDEMENT, PARTIAL ROTATOR CUFF REPAIR AND SUBACROMIAL DECOMPRESSION AND SAD ACD DISTAL CLAVICULECTOMY;  Surgeon: Lorn Junes, MD;  Location: [place];  Service: Orthopedics;  Laterality: Left;  . TONSILLECTOMY      There were no vitals filed for this visit.       Subjective Assessment - 01/20/17 2240    Subjective  1)  Pelvic pain came on 1 year ago insiduously. Pain was worse at night and first thing in the morning 5-6/10, described as achey pain. During the day, no pain except burning with urination.  At the time, pt was treated for variocele and hydrocele. Medications have helped decrease swelling in the perineal area and the issue resolved. Six weeks ago, the Sx reoccurred at a level of 5-6/10 at night and pt was referred to PT.  Pt takes a diffierent medication compared to last bout of pain which has helped him at night.  Pt had sleeping difficulties 6-7 years ago when family members passed away. Pt has been taking sleep medications since then to assist with sleep. But pain interrupts him at night 2-3 x/ night.  Burning with  urination occurs initally and at completion. This occurs 50% of the time with urination.    2) CLBP that occurs intermittently with long periods of sitting, standing, after palying golf, bending with yardwork. Pain radiating down his leg sometimes (L > R , lateral to pinky toe) after stand / walking > 20 min. Pt had a back injury 12-15 years ago from a lifting injury at work. Pt underwent fusion of L4-5 and f/u w/ PT. Pt curently does a few stretches but not the whole PT program.     Pertinent History Pt had a fitness routine for 3x / weeks for 6-8 weeks but stopped 2 months ago due to lack of motivation after his dog passed away. The dog meant alot to him because he adopted the dog after his mom passed.  Pt was doing  crunches 3 sets of 30 with a machine (pt can not remember weight) prior to two months ago.         Patient Stated Goals getting in shape and feeling better             [facility] PT Assessment - 01/20/17 2243      Assessment   Medical Diagnosis pelvic pain    Referring Provider Zara Council     Precautions   Precautions None     Restrictions   Weight Bearing Restrictions No     Balance Screen   Has the patient fallen in the past 6 months No     Prior Function   Level of Independence Independent     Observation/Other Assessments   Observations rounded shoulders   Other Surveys  --  PSQI, NIH -CPSI      Coordination   Gross Motor Movements are Fluid and Coordinated --  limited diaphragmatic excursion   Fine Motor Movements are Fluid and Coordinated --  limited pelvic floor ROM     Squat   Comments anterior COM     Sit to Stand   Comments breath holding     Posture/Postural Control   Posture Comments lumbopelvic perturbation with leg movements     AROM   Overall AROM Comments limited lumbar segmental mobility due surgery     PROM   Overall PROM Comments hip IR ~5 deg B      Palpation   Spinal mobility increased hypomobility at thoracic spine, increased  midback tensions   Palpation comment increased lumbar scar restrictions                 Pelvic Floor Special Questions - 01/20/17 2246    Diastasis Recti 3 fingers below sternum, above umbilicus   External Palpation increased tensions tenderness at B coccygeus          [facility] Adult PT Treatment/Exercise - 01/20/17 2243      Therapeutic Activites    Therapeutic Activities --  see pt instructions     Neuro Re-ed    Neuro Re-ed Details  see pt instructions                PT Education - 01/20/17 2247    Education provided Yes   Education Details POC, anatomy,physiology, goals, HEP   Person(s) Educated Patient   Methods Explanation;Demonstration;Tactile cues;Verbal cues;Handout    Comprehension Returned demonstration;Verbalized understanding             PT Long Term Goals - 01/19/17 1141      PT LONG TERM GOAL #1   Title Pt will report less interruption by pelvic  pain from 2-3 x/ night to < 1 x / night in order to improve quality of sleep.   Time 12   Period Weeks   Status New     PT LONG TERM GOAL #2   Title Pt will decrease his PSQI score from 43% to < 38% in order to improve QOL    Time 12   Period Weeks   Status New     PT LONG TERM GOAL #3   Title Pt will demo less than 1 fingers width along linea alba at least across 2 visits in order to increase intraabdominal pressure and decrease pelvic floor mm tensions to have less pain   Time 12   Period Weeks   Status New     PT LONG TERM GOAL #4   Title Pt will demo decreased tenderness and tensions at B coccygeus mm in order to progress to deep core coordination and advanced fitness exercises   Time 12   Period Weeks   Status New     PT LONG TERM GOAL #5   Title Pt will demo proper alignment and form with fitness routine (weight machine, yoga postures, stretches) in order to minimize relapse of pain   Time 12   Period Weeks   Status New     Additional Long Term Goals   Additional Long Term Goals Yes     PT LONG TERM GOAL #6   Title Pt will demo IND with deep core, multifidis, and thoracolumbar strengthening HEP in order to not have pain with use of weed sprayer    Time 12   Period Weeks   Status New               Plan - 01/20/17 2248    Clinical Impression Statement Pt is a 56 yo male who c/o pelvic pain,  CLBP, sleeping difficulties . These deficits impacts his QOL and ability to sleep and to perform yard work.  Pt's clinical presentations include diastasis recti, limited segmental mobility of spine, increased mm tensions of pelvic floor and thoracic mm, limited hip ROM, and limited education of proper fitness exercises and body mechanics with household / yard chores  that do not load  the pelvic floor or strain the abdominal / back mm, and limited strategies for stress management.  Following Tx, pt demo'd IND with proper body mechanics with household tasks to decrease straining back and co-activation of deep core mm with gravity loaded tasks. Pt has undergone life stressors related to family deaths that likely played a role on pt's Sx. Thus, pt would benefit from a biopsychosocial approach.      Rehab Potential Good   Clinical Impairments Affecting Rehab Potential life stress, back injury, heavy lifting on the job in the past, sit-up and crunches, co-morbidities   PT Frequency 1x / week   PT Duration 12 weeks   PT Treatment/Interventions ADLs/Self Care Home Management;Aquatic Therapy;Traction;Moist Heat;Gait training;Stair training;Functional mobility training;Neuromuscular re-education;Balance training;Therapeutic exercise;Therapeutic activities;Patient/family education;Manual techniques;Scar mobilization;Manual lymph drainage;Taping      Patient will benefit from skilled therapeutic intervention in order to improve the following deficits and impairments:  Pain, Improper body mechanics, Decreased scar mobility, Decreased range of motion, Increased muscle spasms, Decreased mobility, Decreased endurance, Decreased coordination, Decreased activity tolerance, Decreased safety awareness, Decreased strength, Increased fascial restricitons, Decreased balance, Abnormal gait, Difficulty walking, Hypomobility  Visit Diagnosis: Myalgia - Plan: PT plan of care cert/re-cert  Other abnormalities of gait and mobility - Plan: PT plan of care  cert/re-cert  Muscle weakness (generalized) - Plan: PT plan of care cert/re-cert      G-Codes - [id number]    Functional Assessment Tool Used PSQI, clinical judgement , NIH-CPSI, ODI    Functional Limitation Changing and maintaining body position   Changing and Maintaining Body Position Current Status [id number]) At least 40 percent but less than 60  percent impaired, limited or restricted   Changing and Maintaining Body Position Goal Status [id number]) At least 20 percent but less than 40 percent impaired, limited or restricted       Problem List Patient Active Problem List   Diagnosis Date Noted  . Hydrocele sac 01/06/2016  . Varicocele 01/06/2016  . Erectile dysfunction of organic origin 12/20/2015  . BPH with obstruction/lower urinary tract symptoms 12/20/2015  . History of hypogonadism 12/20/2015  . History of nephrolithiasis 12/20/2015  . Neuropathy ([place]) 11/20/2015  . GERD (gastroesophageal reflux disease)   . Chronic back pain   . Hypertension   . Allergic rhinitis 07/25/2013  . Insomnia 07/25/2013  . Diabetes ([place]) 03/16/2013  . Hyperlipidemia with target LDL less than 100 03/16/2013  . Depression 03/16/2013  . Lumbar stenosis with neurogenic claudication 12/26/2012    Jerl Mina ,PT, DPT, E-RYT  01/21/2017, 8:40 AM  [place] MAIN [facility] SERVICES [address] [place], [place], [address] Phone: [phone]   Fax:  [phone]  Name: [name] MRN: [id number] Date of Birth: 1961-05-21

## 2017-01-21 NOTE — Telephone Encounter (Signed)
Left detailed message regarding RX 

## 2017-01-21 NOTE — Telephone Encounter (Signed)
Let patient know please

## 2017-01-24 ENCOUNTER — Ambulatory Visit: Payer: PPO | Admitting: Physical Therapy

## 2017-01-24 DIAGNOSIS — M6281 Muscle weakness (generalized): Secondary | ICD-10-CM

## 2017-01-24 DIAGNOSIS — M791 Myalgia, unspecified site: Secondary | ICD-10-CM

## 2017-01-24 DIAGNOSIS — R2689 Other abnormalities of gait and mobility: Secondary | ICD-10-CM

## 2017-01-24 NOTE — Patient Instructions (Addendum)
Stretches: Modify double knee to chest to single knee to chest with other leg staying strong and feet hip width apart, strap or towel under the thigh  Inhale, exhale belly sinks then knee to chest    Open book (handout)  _________   Deep core   You are now ready to begin training the deep core muscles system: diaphragm, transverse abdominis, pelvic floor . These muscles must work together as a team.           The key to these exercises to train the brain to coordinate the timing of these muscles and to have them turn on for long periods of time to hold you upright against gravity (especially important if you are on your feet all day).These muscles are postural muscles and play a role stabilizing your spine and bodyweight. By doing these repetitions slowly and correctly instead of doing crunches, you will achieve a flatter belly without a lower pooch. You are also placing your spine in a more neutral position and breathing properly which in turn, decreases your risk for problems related to your pelvic floor, abdominal, and low back such as pelvic organ prolapse, hernias, diastasis recti (separation of superficial muscles), disk herniations, spinal fractures. These exercises set a solid foundation for you to later progress to resistance/ strength training with therabands and weights and return to other typical fitness exercises with a stronger deeper core.   Do level 1 : 10 reps Do level 2: 10 reps (left and right = 1 rep) x 3 sets , 2 x day Do not progress to level 3 for 3-4 weeks. You know you are ready when you do not have any rocking of pelvis nor arching in your back    (handout)   _______  Scooping dog food and dumping it into the bowl, Use the squat technique where you scoot hips back and then your knees behind toes

## 2017-01-24 NOTE — Telephone Encounter (Signed)
x

## 2017-01-24 NOTE — Therapy (Signed)
[place] MAIN [facility] SERVICES [address] [place], [place], [address] Phone: [phone]   Fax:  [phone]  Physical Therapy Treatment  Patient Details  Name: [name] MRN: [id number] Date of Birth: [birth year] Referring Provider: Zara Council  Encounter Date: 01/24/2017      PT End of Session - 01/24/17 1055    Visit Number 2   Number of Visits 12   Date for PT Re-Evaluation 04/13/17   Authorization Type g -code   PT Start Time 1010   PT Stop Time 1105   PT Time Calculation (min) 55 min      Past Medical History:  Diagnosis Date  . Arthritis   . Chronic back pain    stenosis  . Constipation    with pain meds  . Depression    takes Zoloft daily  . Diabetes mellitus without complication ([place])    takes Metformni daily  . Dizziness   . GERD (gastroesophageal reflux disease)    takes Nexium daily  . Headache(784.0)    occasionally  . Hyperlipidemia    takes Zetia daily  . Hypertension    takes Benicar daily  . Impingement syndrome of left shoulder   . Insomnia    takes Ambien nightly  . Joint pain   . Left rotator cuff tear   . Pneumonia 1986   walking  . Seasonal allergies    takes Claritin daily  . Tear of medial meniscus of right knee   . Urinary frequency    takes Flomax daily  . Urinary urgency     Past Surgical History:  Procedure Laterality Date  . BACK SURGERY  [id number]   lumb fusion  . COLONOSCOPY    . KNEE ARTHROSCOPY WITH MEDIAL MENISECTOMY Right 09/26/2013   Procedure: RIGHT KNEE ARTHROSCOPY WITH PARTIAL MEDIAL and lateral chrondroplasty MENISECTOMY;  Surgeon: Lorn Junes, MD;  Location: [place];  Service: Orthopedics;  Laterality: Right;  . left elbow surgery    . LITHOTRIPSY    . NASAL SEPTUM SURGERY    . removal of kidney stones  1981   lt open removal  . SHOULDER ARTHROSCOPY WITH ROTATOR CUFF REPAIR AND SUBACROMIAL DECOMPRESSION Left 09/26/2013   Procedure: LEFT  SHOULDER ARTHROSCOPY WITH DEBRIDEMENT, PARTIAL ROTATOR CUFF REPAIR AND SUBACROMIAL DECOMPRESSION AND SAD ACD DISTAL CLAVICULECTOMY;  Surgeon: Lorn Junes, MD;  Location: [place];  Service: Orthopedics;  Laterality: Left;  . TONSILLECTOMY      There were no vitals filed for this visit.      Subjective Assessment - 01/24/17 1015    Subjective Pt reported he has been practicing his HEP with gettign out of bed, sitting, and lifting.    Pertinent History Pt had a fitness routine for 3x / weeks for 6-8 weeks but stopped 2 months ago due to lack of motivation after his dog passed away. The dog meant alot to him because he adopted the dog after his mom passed.  Pt was doing crunches 3 sets of 30 with a machine (pt can not remember weight) prior to two months ago.         Patient Stated Goals getting in shape and feeling better             [facility] PT Assessment - 01/24/17 1039      Posture/Postural Control   Posture Comments required cues for less perturbation with deep core level 2, required cue for pelvic neutral and less  lumbar lordosis      Palpation   Palpation comment decreased scar restrictions and tenderness following scar release                  Pelvic Floor Special Questions - 01/24/17 1036    Diastasis Recti no fingers below nor above. Required manual Tx for excursion and depression of ribcage only     External Palpation no tensions at B coccygeus            [facility] Adult PT Treatment/Exercise - 01/24/17 1039      Therapeutic Activites    Therapeutic Activities --  see pt instructions     Neuro Re-ed    Neuro Re-ed Details  see pt instructions     Manual Therapy   Manual therapy comments myofascial release for vertnum, intercostals, STM paraspinals to faciliate exursion/ depression of ribcage                 PT Education - 01/24/17 1055    Education provided Yes   Education Details HEP   Person(s) Educated Patient   Methods  Explanation;Demonstration;Tactile cues;Verbal cues;Handout   Comprehension Returned demonstration;Verbalized understanding             PT Long Term Goals - 01/24/17 1101      PT LONG TERM GOAL #1   Title Pt will report less interruption by pelvic pain from 2-3 x/ night to < 1 x / night in order to improve quality of sleep.   Time 12   Period Weeks   Status On-going     PT LONG TERM GOAL #2   Title Pt will decrease his PSQI score from 43% to < 38% in order to improve QOL    Time 12   Period Weeks   Status On-going     PT LONG TERM GOAL #3   Title Pt will demo less than 1 fingers width along linea alba at least across 2 visits in order to increase intraabdominal pressure and decrease pelvic floor mm tensions to have less pain   Time 12   Period Weeks   Status Achieved     PT LONG TERM GOAL #4   Title Pt will demo decreased tenderness and tensions at B coccygeus mm in order to progress to deep core coordination and advanced fitness exercises   Time 12   Period Weeks   Status Achieved     PT LONG TERM GOAL #5   Title Pt will demo proper alignment and form with fitness routine (weight machine, yoga postures, stretches) in order to minimize relapse of pain   Time 12   Period Weeks   Status On-going     PT LONG TERM GOAL #6   Title Pt will demo IND with deep core, multifidis, and thoracolumbar strengthening HEP in order to not have pain with use of weed sprayer    Time 12   Period Weeks   Status On-going               Plan - 01/24/17 1056    Clinical Impression Statement Pt demo'd decreased abdominal separation and decreased pelvic floor mm tensions compared to last session.  Pt required modification and cuing for co-activation of deep core mm with his self-selected exercise (double knee to chest) to (single knee to chest with a towel) in order to decrease abdominal bulging. Pt demo'd correctly. Initiated to deep core level 1-2. Plan to incorporate multifidis  strengthening at next session. Pt continues  to benefit from skilled PT.      Rehab Potential Good   Clinical Impairments Affecting Rehab Potential life stress, back injury, heavy lifting on the job in the past, sit-up and crunches, co-morbidities   PT Frequency 1x / week   PT Duration 12 weeks   PT Treatment/Interventions ADLs/Self Care Home Management;Aquatic Therapy;Traction;Moist Heat;Gait training;Stair training;Functional mobility training;Neuromuscular re-education;Balance training;Therapeutic exercise;Therapeutic activities;Patient/family education;Manual techniques;Scar mobilization;Manual lymph drainage;Taping   Consulted and Agree with Plan of Care Patient      Patient will benefit from skilled therapeutic intervention in order to improve the following deficits and impairments:  Pain, Improper body mechanics, Decreased scar mobility, Decreased range of motion, Increased muscle spasms, Decreased mobility, Decreased endurance, Decreased coordination, Decreased activity tolerance, Decreased safety awareness, Decreased strength, Increased fascial restricitons, Decreased balance, Abnormal gait, Difficulty walking, Hypomobility  Visit Diagnosis: Myalgia  Other abnormalities of gait and mobility  Muscle weakness (generalized)     Problem List Patient Active Problem List   Diagnosis Date Noted  . Hydrocele sac 01/06/2016  . Varicocele 01/06/2016  . Erectile dysfunction of organic origin 12/20/2015  . BPH with obstruction/lower urinary tract symptoms 12/20/2015  . History of hypogonadism 12/20/2015  . History of nephrolithiasis 12/20/2015  . Neuropathy ([place]) 11/20/2015  . GERD (gastroesophageal reflux disease)   . Chronic back pain   . Hypertension   . Allergic rhinitis 07/25/2013  . Insomnia 07/25/2013  . Diabetes ([place]) 03/16/2013  . Hyperlipidemia with target LDL less than 100 03/16/2013  . Depression 03/16/2013  . Lumbar stenosis with neurogenic claudication 12/26/2012     Jerl Mina ,PT, DPT, E-RYT  01/24/2017, 11:04 AM  [place] MAIN [facility] SERVICES [address] [place], [place], [address] Phone: [phone]   Fax:  [phone]  Name: [name] MRN: [id number] Date of Birth: 1961/03/17

## 2017-01-31 ENCOUNTER — Ambulatory Visit (INDEPENDENT_AMBULATORY_CARE_PROVIDER_SITE_OTHER): Payer: PPO | Admitting: Nurse Practitioner

## 2017-01-31 ENCOUNTER — Encounter: Payer: Self-pay | Admitting: Nurse Practitioner

## 2017-01-31 VITALS — BP 96/70 | HR 79 | Temp 97.3°F | Ht 70.0 in | Wt 230.0 lb

## 2017-01-31 DIAGNOSIS — I10 Essential (primary) hypertension: Secondary | ICD-10-CM

## 2017-01-31 DIAGNOSIS — Z1212 Encounter for screening for malignant neoplasm of rectum: Secondary | ICD-10-CM | POA: Diagnosis not present

## 2017-01-31 DIAGNOSIS — F5101 Primary insomnia: Secondary | ICD-10-CM | POA: Diagnosis not present

## 2017-01-31 DIAGNOSIS — G629 Polyneuropathy, unspecified: Secondary | ICD-10-CM | POA: Diagnosis not present

## 2017-01-31 DIAGNOSIS — Z1211 Encounter for screening for malignant neoplasm of colon: Secondary | ICD-10-CM | POA: Diagnosis not present

## 2017-01-31 DIAGNOSIS — E785 Hyperlipidemia, unspecified: Secondary | ICD-10-CM | POA: Diagnosis not present

## 2017-01-31 DIAGNOSIS — E119 Type 2 diabetes mellitus without complications: Secondary | ICD-10-CM | POA: Diagnosis not present

## 2017-01-31 DIAGNOSIS — F3342 Major depressive disorder, recurrent, in full remission: Secondary | ICD-10-CM | POA: Diagnosis not present

## 2017-01-31 DIAGNOSIS — M48062 Spinal stenosis, lumbar region with neurogenic claudication: Secondary | ICD-10-CM | POA: Diagnosis not present

## 2017-01-31 DIAGNOSIS — K219 Gastro-esophageal reflux disease without esophagitis: Secondary | ICD-10-CM | POA: Diagnosis not present

## 2017-01-31 LAB — BAYER DCA HB A1C WAIVED: HB A1C: 6.9 % (ref <7.0)

## 2017-01-31 NOTE — Patient Instructions (Signed)

## 2017-01-31 NOTE — Progress Notes (Signed)
 Subjective:    Patient ID: [name], male    DOB: 09/02/1961, 56 y.o.   MRN: [id number]   Patient here today for follow up of chronic medical problems. No complaints today.  Outpatient Encounter Prescriptions as of 06/21/2016  Medication Sig  . diazepam (VALIUM) 5 MG tablet Take 1-2 tablets (5-10 mg total) by mouth every 6 (six) hours as needed.  . esomeprazole (NEXIUM) 40 MG capsule TAKE ONE (1) CAPSULE EACH DAY  . ezetimibe (ZETIA) 10 MG tablet Take 1 tablet (10 mg total) by mouth daily.  . fish oil-omega-3 fatty acids 1000 MG capsule Take 1 g by mouth 2 (two) times daily.  . fluticasone (FLONASE) 50 MCG/ACT nasal spray USE 1 SPRAY IN EACH NOSTRIL ONCE DAILY  . gabapentin (NEURONTIN) 300 MG capsule TAKE ONE CAPSULE BY MOUTH AT BEDTIME  . glucose blood (ONE TOUCH ULTRA TEST) test strip Test 1 x per day and prn-- dx 250.01  . hydrocortisone (ANUSOL-HC) 2.5 % rectal cream Place 1 application rectally 2 (two) times daily.  . loratadine (CLARITIN) 10 MG tablet TAKE ONE (1) TABLET EACH DAY  . meloxicam (MOBIC) 15 MG tablet Reported on 01/02/2016  . metFORMIN (GLUCOPHAGE) 1000 MG tablet Take 1 tablet (1,000 mg total) by mouth 2 (two) times daily with a meal.  . Multiple Vitamin (MULTIVITAMIN WITH MINERALS) TABS Take 1 tablet by mouth daily.  . olmesartan-hydrochlorothiazide (BENICAR HCT) 40-25 MG tablet TAKE ONE (1) TABLET EACH DAY  . olopatadine (PATANOL) 0.1 % ophthalmic solution INSTILL ONE DROP IN BOTH EYES TWICE DAILY  . oxyCODONE-acetaminophen (PERCOCET) 10-325 MG per tablet 1-2 tablets every 4-6 hrs as needed for pain  . sertraline (ZOLOFT) 100 MG tablet 2 po qd  . simvastatin (ZOCOR) 40 MG tablet Take 1 tablet (40 mg total) by mouth at bedtime.  . tadalafil (CIALIS) 5 MG tablet Take 1 tablet (5 mg total) by mouth daily as needed for erectile dysfunction.  . tamsulosin (FLOMAX) 0.4 MG CAPS capsule TAKE ONE (1) CAPSULE EACH DAY  . zolpidem (AMBIEN) 10 MG tablet TAKE ONE TABLET AT  BEDTIME AS NEEDED        Hyperglycemia  This is a chronic problem. The current episode started more than 1 year ago. The problem has been waxing and waning. Associated symptoms include diaphoresis (Some night sweats recently ). Pertinent negatives include no chest pain, chills, headaches, nausea, neck pain or weakness. Associated symptoms comments: Tingling in bilateral feet. . Nothing aggravates the symptoms. He has tried rest, position changes, lying down, walking and sleep for the symptoms. The treatment provided significant relief.  Hypertension  This is a chronic problem. The current episode started more than 1 year ago. The problem is unchanged. The problem is controlled. Pertinent negatives include no chest pain, headaches, neck pain, palpitations, peripheral edema or shortness of breath. There are no associated agents to hypertension. Risk factors for coronary artery disease include dyslipidemia, diabetes mellitus, male gender and obesity. Past treatments include angiotensin blockers and diuretics. The current treatment provides significant improvement. Compliance problems include diet and exercise.   Hyperlipidemia  This is a chronic problem. The current episode started more than 1 year ago. The problem is uncontrolled. Recent lipid tests were reviewed and are high. Exacerbating diseases include diabetes and obesity. He has no history of hypothyroidism. Factors aggravating his hyperlipidemia include thiazides. Pertinent negatives include no chest pain or shortness of breath. Current antihyperlipidemic treatment includes statins and ezetimibe. The current treatment provides moderate improvement of lipids.   Compliance problems include adherence to diet and adherence to exercise.  Risk factors for coronary artery disease include dyslipidemia, family history, hypertension and male sex.  Diabetes  He presents for his follow-up diabetic visit. He has type 2 diabetes mellitus. No MedicAlert  identification noted. There are no hypoglycemic associated symptoms. Pertinent negatives for hypoglycemia include no headaches. Associated symptoms include foot paresthesias (bilateral foot tingling last month). Pertinent negatives for diabetes include no chest pain and no weakness. There are no hypoglycemic complications. Risk factors for coronary artery disease include dyslipidemia, diabetes mellitus, hypertension, male sex and obesity. Current diabetic treatment includes oral agent (monotherapy). He is compliant with treatment most of the time. His weight is stable. When asked about meal planning, he reported none. He has not had a previous visit with a dietitian. His breakfast blood glucose is taken between 8-9 am. His breakfast blood glucose range is generally 130-140 mg/dl. His overall blood glucose range is 130-140 mg/dl. (Does not check very often) An ACE inhibitor/angiotensin II receptor blocker is being taken. He does not see a podiatrist.Eye exam is not current.  GERD nexium works well- no symptoms when takes med Depression He reports he weaned himself off of the Zoloft-last dose 01/09/2015 and has been feeling well since. Feels very positive about his situation.  insomnia ambien helps him rest well without side effects Back pain/lumbar stenosis He states he always has baseline back pain, but will only take valium and pain medications after increased activity and pain. Only takes percocet as needed- takes maybe 2-3 times a month. Has started to see chiropractor. He would like to try gabapentin- says pain is nerve pain and goes down both legs- worse on right then left. BPH flomax was helping with his symptoms but does not seem as effective in the last month.  ED cialis works well without side effects  Review of Systems  Constitutional: Positive for diaphoresis (Some night sweats recently ). Negative for chills and unexpected weight change.  Eyes: Negative for visual disturbance.    Respiratory: Negative for chest tightness and shortness of breath.   Cardiovascular: Negative for chest pain and palpitations.  Gastrointestinal: Negative for nausea.  Musculoskeletal: Negative for neck pain.  Neurological: Negative for weakness and headaches.  All other systems reviewed and are negative.      Objective:   Physical Exam  Constitutional: He is oriented to person, place, and time. He appears well-developed and well-nourished.  HENT:  Head: Normocephalic.  Right Ear: External ear normal.  Left Ear: External ear normal.  Nose: Nose normal.  Mouth/Throat: Oropharynx is clear and moist.  Eyes: EOM are normal. Pupils are equal, round, and reactive to light.  Neck: Normal range of motion. Neck supple. No JVD present. No thyromegaly present.  Cardiovascular: Normal rate, regular rhythm, normal heart sounds and intact distal pulses.  Exam reveals no gallop and no friction rub.   No murmur heard. Pulmonary/Chest: Effort normal and breath sounds normal. No respiratory distress. He has no wheezes. He has no rales. He exhibits no tenderness.  Abdominal: Soft. Bowel sounds are normal. He exhibits no mass. There is no tenderness.  Genitourinary:  Genitourinary Comments: Prostate exam deferred to urology  Musculoskeletal: Normal range of motion. He exhibits no edema.  Decrease ROM of lumbar spine due to pain on flexion and extension (-) SLR bil Motor strength and sensation distally intact  Lymphadenopathy:    He has no cervical adenopathy.  Neurological: He is alert and oriented to person, place, and time.   No cranial nerve deficit.  Skin: Skin is warm and dry.  Psychiatric: He has a normal mood and affect. His behavior is normal. Judgment and thought content normal.   BP 96/70   Pulse 79   Temp 97.3 F (36.3 C) (Oral)   Ht 5' 10" (1.778 m)   Wt 230 lb (104.3 kg)   BMI 33.00 kg/m     hgba1c 6.9%        Assessment & Plan:  1. Type 2 diabetes mellitus without  complication, unspecified long term insulin use status (HCC) Continue to watch carbs in diet - Bayer DCA Hb A1c Waived - Microalbumin / creatinine urine ratio  2. Hyperlipidemia with target LDL less than 100 Low fat diet - Lipid panel - Lipid panel  3. Essential hypertension Low sodium diet - CMP14+EGFR - CMP14+EGFR  4. Gastroesophageal reflux disease without esophagitis Avoid spicy foods Do not eat 2 hours prior to bedtime  5. Neuropathy ([place]) Do not go barefooted  6. Recurrent major depressive disorder, in full remission [facility]) Stress management  7. Primary insomnia Bedtime routine  8. Lumbar stenosis with neurogenic claudication  9. Encounter for colorectal cancer screening - Fecal occult blood, imunochemical; Future    Labs pending Health maintenance reviewed Diet and exercise encouraged Continue all meds Follow up  In 3 months   [place], FNP

## 2017-02-01 LAB — CMP14+EGFR
ALK PHOS: 36 IU/L — AB (ref 39–117)
ALT: 41 IU/L (ref 0–44)
AST: 35 IU/L (ref 0–40)
Albumin/Globulin Ratio: 2.2 (ref 1.2–2.2)
Albumin: 4.8 g/dL (ref 3.5–5.5)
BUN/Creatinine Ratio: 17 (ref 9–20)
BUN: 24 mg/dL (ref 6–24)
Bilirubin Total: 0.4 mg/dL (ref 0.0–1.2)
CO2: 17 mmol/L — AB (ref 18–29)
CREATININE: 1.43 mg/dL — AB (ref 0.76–1.27)
Calcium: 9.5 mg/dL (ref 8.7–10.2)
Chloride: 103 mmol/L (ref 96–106)
GFR calc Af Amer: 62 mL/min/{1.73_m2} (ref >59)
GFR calc non Af Amer: 54 mL/min/{1.73_m2} — ABNORMAL LOW (ref >59)
GLUCOSE: 149 mg/dL — AB (ref 65–99)
Globulin, Total: 2.2 g/dL (ref 1.5–4.5)
Potassium: 4.7 mmol/L (ref 3.5–5.2)
SODIUM: 142 mmol/L (ref 134–144)
Total Protein: 7 g/dL (ref 6.0–8.5)

## 2017-02-01 LAB — LIPID PANEL
CHOLESTEROL TOTAL: 104 mg/dL (ref 100–199)
Chol/HDL Ratio: 2.9 ratio units (ref 0.0–5.0)
HDL: 36 mg/dL — ABNORMAL LOW (ref >39)
LDL CALC: 49 mg/dL (ref 0–99)
TRIGLYCERIDES: 94 mg/dL (ref 0–149)
VLDL CHOLESTEROL CAL: 19 mg/dL (ref 5–40)

## 2017-02-02 DIAGNOSIS — L723 Sebaceous cyst: Secondary | ICD-10-CM | POA: Diagnosis not present

## 2017-02-02 DIAGNOSIS — L821 Other seborrheic keratosis: Secondary | ICD-10-CM | POA: Diagnosis not present

## 2017-02-02 DIAGNOSIS — D229 Melanocytic nevi, unspecified: Secondary | ICD-10-CM | POA: Diagnosis not present

## 2017-02-09 ENCOUNTER — Ambulatory Visit: Payer: PPO | Admitting: Physical Therapy

## 2017-02-16 ENCOUNTER — Ambulatory Visit: Payer: PPO | Attending: Urology | Admitting: Physical Therapy

## 2017-02-24 ENCOUNTER — Encounter: Payer: Self-pay | Admitting: *Deleted

## 2017-03-10 ENCOUNTER — Ambulatory Visit: Payer: PPO | Admitting: Physical Therapy

## 2017-03-23 ENCOUNTER — Encounter: Payer: 59 | Admitting: Physical Therapy

## 2017-04-12 ENCOUNTER — Other Ambulatory Visit: Payer: Self-pay | Admitting: Nurse Practitioner

## 2017-04-12 DIAGNOSIS — M5442 Lumbago with sciatica, left side: Principal | ICD-10-CM

## 2017-04-12 DIAGNOSIS — G8929 Other chronic pain: Secondary | ICD-10-CM

## 2017-04-12 DIAGNOSIS — M5441 Lumbago with sciatica, right side: Principal | ICD-10-CM

## 2017-05-04 ENCOUNTER — Ambulatory Visit: Payer: PPO | Admitting: Nurse Practitioner

## 2017-05-05 ENCOUNTER — Ambulatory Visit: Payer: PPO | Admitting: Nurse Practitioner

## 2017-05-05 ENCOUNTER — Encounter: Payer: Self-pay | Admitting: Nurse Practitioner

## 2017-05-05 VITALS — BP 101/70 | HR 70 | Temp 96.9°F | Ht 70.0 in | Wt 233.0 lb

## 2017-05-05 DIAGNOSIS — K219 Gastro-esophageal reflux disease without esophagitis: Secondary | ICD-10-CM

## 2017-05-05 DIAGNOSIS — F3341 Major depressive disorder, recurrent, in partial remission: Secondary | ICD-10-CM | POA: Diagnosis not present

## 2017-05-05 DIAGNOSIS — E119 Type 2 diabetes mellitus without complications: Secondary | ICD-10-CM

## 2017-05-05 DIAGNOSIS — G8929 Other chronic pain: Secondary | ICD-10-CM | POA: Diagnosis not present

## 2017-05-05 DIAGNOSIS — E785 Hyperlipidemia, unspecified: Secondary | ICD-10-CM | POA: Diagnosis not present

## 2017-05-05 DIAGNOSIS — F3342 Major depressive disorder, recurrent, in full remission: Secondary | ICD-10-CM

## 2017-05-05 DIAGNOSIS — I10 Essential (primary) hypertension: Secondary | ICD-10-CM | POA: Diagnosis not present

## 2017-05-05 DIAGNOSIS — G629 Polyneuropathy, unspecified: Secondary | ICD-10-CM | POA: Diagnosis not present

## 2017-05-05 DIAGNOSIS — F5101 Primary insomnia: Secondary | ICD-10-CM | POA: Diagnosis not present

## 2017-05-05 DIAGNOSIS — M5442 Lumbago with sciatica, left side: Secondary | ICD-10-CM

## 2017-05-05 DIAGNOSIS — M5441 Lumbago with sciatica, right side: Secondary | ICD-10-CM

## 2017-05-05 LAB — BAYER DCA HB A1C WAIVED: HB A1C: 7.6 % — AB (ref <7.0)

## 2017-05-05 MED ORDER — SERTRALINE HCL 100 MG PO TABS: ORAL_TABLET | ORAL | 5 refills | Status: DC

## 2017-05-05 MED ORDER — METFORMIN HCL 1000 MG PO TABS: 1000.0000 mg | ORAL_TABLET | Freq: Two times a day (BID) | ORAL | 1 refills | Status: DC

## 2017-05-05 MED ORDER — ESOMEPRAZOLE MAGNESIUM 40 MG PO CPDR: 40.0000 mg | DELAYED_RELEASE_CAPSULE | Freq: Every day | ORAL | 1 refills | Status: DC

## 2017-05-05 MED ORDER — OLMESARTAN MEDOXOMIL-HCTZ 40-25 MG PO TABS: ORAL_TABLET | ORAL | 1 refills | Status: DC

## 2017-05-05 MED ORDER — EZETIMIBE 10 MG PO TABS: 10.0000 mg | ORAL_TABLET | Freq: Every day | ORAL | 1 refills | Status: DC

## 2017-05-05 MED ORDER — SIMVASTATIN 40 MG PO TABS: 40.0000 mg | ORAL_TABLET | Freq: Every day | ORAL | 1 refills | Status: DC

## 2017-05-05 MED ORDER — GABAPENTIN 300 MG PO CAPS: 300.0000 mg | ORAL_CAPSULE | Freq: Two times a day (BID) | ORAL | 1 refills | Status: DC

## 2017-05-05 NOTE — Progress Notes (Signed)
Subjective:    Patient ID: [name], male    DOB: 07-30-1961, 56 y.o.   MRN: [id number]  HPI   [name] is here today for follow up of chronic medical problem.  Outpatient Encounter Prescriptions as of 05/05/2017  Medication Sig  . diazepam (VALIUM) 5 MG tablet Take 1-2 tablets (5-10 mg total) by mouth every 6 (six) hours as needed.  Marland Kitchen esomeprazole (NEXIUM) 40 MG capsule Take 1 capsule (40 mg total) by mouth daily at 12 noon.  . ezetimibe (ZETIA) 10 MG tablet Take 1 tablet (10 mg total) by mouth daily.  . fish oil-omega-3 fatty acids 1000 MG capsule Take 1 g by mouth 2 (two) times daily.  . fluticasone (FLONASE) 50 MCG/ACT nasal spray USE 1 SPRAY IN EACH NOSTRIL ONCE DAILY  . gabapentin (NEURONTIN) 300 MG capsule TAKE ONE CAPSULE BY MOUTH TWICE DAILY  . glucose blood (ONE TOUCH ULTRA TEST) test strip Test 1 x per day and prn-- dx 250.01  . hydrocortisone (ANUSOL-HC) 2.5 % rectal cream Place 1 application rectally 2 (two) times daily.  Marland Kitchen loratadine (CLARITIN) 10 MG tablet TAKE ONE (1) TABLET EACH DAY  . meloxicam (MOBIC) 15 MG tablet Take 1 tablet (15 mg total) by mouth daily. with food  . metFORMIN (GLUCOPHAGE) 1000 MG tablet Take 1 tablet (1,000 mg total) by mouth 2 (two) times daily with a meal.  . Multiple Vitamin (MULTIVITAMIN WITH MINERALS) TABS Take 1 tablet by mouth daily.  Marland Kitchen olmesartan-hydrochlorothiazide (BENICAR HCT) 40-25 MG tablet TAKE ONE (1) TABLET EACH DAY  . olopatadine (PATANOL) 0.1 % ophthalmic solution PLACE 1 DROP IN [facility] EYE TWICE DAILY  . oxyCODONE-acetaminophen (PERCOCET) 10-325 MG tablet 1-2 tablets every 4-6 hrs as needed for pain  . sertraline (ZOLOFT) 100 MG tablet 2 po qd  . simvastatin (ZOCOR) 40 MG tablet Take 1 tablet (40 mg total) by mouth at bedtime.  . tadalafil (CIALIS) 5 MG tablet Take 1 tablet (5 mg total) by mouth daily as needed for erectile dysfunction.  . tamsulosin (FLOMAX) 0.4 MG CAPS capsule Take 1 capsule (0.4 mg total) by mouth  daily.  Marland Kitchen zolpidem (AMBIEN) 10 MG tablet Take 1 tablet (10 mg total) by mouth at bedtime as needed.   No facility-administered encounter medications on file as of 05/05/2017.     1. Hyperlipidemia with target LDL less than 100  Trying to watch diet  2. Essential hypertension  No c/o SOB, chest pain or HA- does not check blood pressures at home  3. Type 2 diabetes mellitus without complication, without long-term current use of insulin (HCC) Last Hgba1c was 6.9%- does not check blood sugars everyday but when does check fasting averaging around 110. No hypoglycemia  4. Gastroesophageal reflux disease without esophagitis  nexium works well to keep symptoms under ocntrol  5. Neuropathy  In feet- does not go barefooted- is on neurontin which helps  6. Chronic bilateral low back pain with bilateral sciatica  This is chronic for him but does not take any pain meds at this time. Rates pain between 1-4/10 depending on activity.  7. Recurrent major depressive disorder, in full remission ([place])  on zoloft which works well  8. Primary insomnia  On Lorrin Mais which works well to help him sleep    New complaints: Both of his parents are deceased and for some reason this year it has really hit him hard. Depression screen [facility] 2/9 05/05/2017 01/31/2017 10/11/2016 06/21/2016 02/19/2016  Decreased Interest 1 0 0  0 1  Down, Depressed, Hopeless 1 1 0 0 0  PHQ - 2 Score 2 1 0 0 1  Altered sleeping 0 - - - -  Tired, decreased energy 1 - - - -  Change in appetite 1 - - - -  Feeling bad or failure about yourself  0 - - - -  Trouble concentrating 0 - - - -  Moving slowly or fidgety/restless 0 - - - -  Suicidal thoughts 0 - - - -  PHQ-9 Score 4 - - - -       Review of Systems  Constitutional: Negative for diaphoresis.  Eyes: Negative for pain.  Respiratory: Negative for shortness of breath.   Cardiovascular: Negative for chest pain, palpitations and leg swelling.  Gastrointestinal: Negative for abdominal  pain.  Endocrine: Negative for polydipsia.  Skin: Negative for rash.  Neurological: Negative for dizziness, weakness and headaches.  Hematological: Does not bruise/bleed easily.       Objective:   Physical Exam  Constitutional: He is oriented to person, place, and time. He appears well-developed and well-nourished.  HENT:  Head: Normocephalic.  Right Ear: External ear normal.  Left Ear: External ear normal.  Nose: Nose normal.  Mouth/Throat: Oropharynx is clear and moist.  Eyes: EOM are normal. Pupils are equal, round, and reactive to light.  Neck: Normal range of motion. Neck supple. No JVD present. No thyromegaly present.  Cardiovascular: Normal rate, regular rhythm, normal heart sounds and intact distal pulses.  Exam reveals no gallop and no friction rub.   No murmur heard. Pulmonary/Chest: Effort normal and breath sounds normal. No respiratory distress. He has no wheezes. He has no rales. He exhibits no tenderness.  Abdominal: Soft. Bowel sounds are normal. He exhibits no mass. There is no tenderness.  Genitourinary: Prostate normal and penis normal.  Musculoskeletal: Normal range of motion. He exhibits no edema.  Lymphadenopathy:    He has no cervical adenopathy.  Neurological: He is alert and oriented to person, place, and time. No cranial nerve deficit.  Skin: Skin is warm and dry.  Psychiatric: He has a normal mood and affect. His behavior is normal. Judgment and thought content normal.   BP 101/70   Pulse 70   Temp (!) 96.9 F (36.1 C) (Oral)   Ht '5\' 10"'  (1.778 m)   Wt 233 lb (105.7 kg)   BMI 33.43 kg/m   hgba1c 7.6%       Assessment & Plan:  1. Hyperlipidemia with target LDL less than 100 Low fat diet - Lipid panel - simvastatin (ZOCOR) 40 MG tablet; Take 1 tablet (40 mg total) by mouth at bedtime.  Dispense: 90 tablet; Refill: 1 - ezetimibe (ZETIA) 10 MG tablet; Take 1 tablet (10 mg total) by mouth daily.  Dispense: 90 tablet; Refill: 1  2. Essential  hypertension Low sodium diet - CMP14+EGFR - olmesartan-hydrochlorothiazide (BENICAR HCT) 40-25 MG tablet; TAKE ONE (1) TABLET EACH DAY  Dispense: 90 tablet; Refill: 1  3. Type 2 diabetes mellitus without complication, without long-term current use of insulin ([place]) Patient did not want to add meds at this time- wants to try diet control along with metformin.'carb counting information given - Bayer DCA Hb A1c Waived - metFORMIN (GLUCOPHAGE) 1000 MG tablet; Take 1 tablet (1,000 mg total) by mouth 2 (two) times daily with a meal.  Dispense: 180 tablet; Refill: 1  4. Gastroesophageal reflux disease without esophagitis Avoid spicy foods Do not eat 2 hours prior to bedtime - esomeprazole (  NEXIUM) 40 MG capsule; Take 1 capsule (40 mg total) by mouth daily at 12 noon.  Dispense: 90 capsule; Refill: 1  5. Neuropathy Do n ot go barefooted  6. Chronic bilateral low back pain with bilateral sciatica - gabapentin (NEURONTIN) 300 MG capsule; Take 1 capsule (300 mg total) by mouth 2 (two) times daily.  Dispense: 180 capsule; Refill: 1  7. Recurrent major depressive disorder, in full remission ([place]) Stress management  8. Primary insomnia Bedtime routine  9. Recurrent major depressive disorder, in partial remission (HCC) Stress management - sertraline (ZOLOFT) 100 MG tablet; 2 po qd  Dispense: 60 tablet; Refill: 5    Labs pending Health maintenance reviewed Diet and exercise encouraged Continue all meds Follow up  In 3 months   [place], FNP

## 2017-05-05 NOTE — Patient Instructions (Signed)
Carbohydrate Counting for Diabetes Mellitus, Adult Carbohydrate counting is a method for keeping track of how many carbohydrates you eat. Eating carbohydrates naturally increases the amount of sugar (glucose) in the blood. Counting how many carbohydrates you eat helps keep your blood glucose within normal limits, which helps you manage your diabetes (diabetes mellitus). It is important to know how many carbohydrates you can safely have in each meal. This is different for every person. A diet and nutrition specialist (registered dietitian) can help you make a meal plan and calculate how many carbohydrates you should have at each meal and snack. Carbohydrates are found in the following foods:  Grains, such as breads and cereals.  Dried beans and soy products.  Starchy vegetables, such as potatoes, peas, and corn.  Fruit and fruit juices.  Milk and yogurt.  Sweets and snack foods, such as cake, cookies, candy, chips, and soft drinks. How do I count carbohydrates? There are two ways to count carbohydrates in food. You can use either of the methods or a combination of both. Reading "Nutrition Facts" on packaged food  The "Nutrition Facts" list is included on the labels of almost all packaged foods and beverages in the [place] It includes:  The serving size.  Information about nutrients in each serving, including the grams (g) of carbohydrate per serving. To use the "Nutrition Facts":  Decide how many servings you will have.  Multiply the number of servings by the number of carbohydrates per serving.  The resulting number is the total amount of carbohydrates that you will be having. Learning standard serving sizes of other foods  When you eat foods containing carbohydrates that are not packaged or do not include "Nutrition Facts" on the label, you need to measure the servings in order to count the amount of carbohydrates:  Measure the foods that you will eat with a food scale or measuring  cup, if needed.  Decide how many standard-size servings you will eat.  Multiply the number of servings by 15. Most carbohydrate-rich foods have about 15 g of carbohydrates per serving.  For example, if you eat 8 oz (170 g) of strawberries, you will have eaten 2 servings and 30 g of carbohydrates (2 servings x 15 g = 30 g).  For foods that have more than one food mixed, such as soups and casseroles, you must count the carbohydrates in each food that is included. The following list contains standard serving sizes of common carbohydrate-rich foods. Each of these servings has about 15 g of carbohydrates:   hamburger bun or  English muffin.   oz (15 mL) syrup.   oz (14 g) jelly.  1 slice of bread.  1 six-inch tortilla.  3 oz (85 g) cooked rice or pasta.  4 oz (113 g) cooked dried beans.  4 oz (113 g) starchy vegetable, such as peas, corn, or potatoes.  4 oz (113 g) hot cereal.  4 oz (113 g) mashed potatoes or  of a large baked potato.  4 oz (113 g) canned or frozen fruit.  4 oz (120 mL) fruit juice.  4-6 crackers.  6 chicken nuggets.  6 oz (170 g) unsweetened dry cereal.  6 oz (170 g) plain fat-free yogurt or yogurt sweetened with artificial sweeteners.  8 oz (240 mL) milk.  8 oz (170 g) fresh fruit or one small piece of fruit.  24 oz (680 g) popped popcorn. Example of carbohydrate counting Sample meal  3 oz (85 g) chicken breast.  6 oz (  170 g) brown rice.  4 oz (113 g) corn.  8 oz (240 mL) milk.  8 oz (170 g) strawberries with sugar-free whipped topping. Carbohydrate calculation 1. Identify the foods that contain carbohydrates:  Rice.  Corn.  Milk.  Strawberries. 2. Calculate how many servings you have of each food:  2 servings rice.  1 serving corn.  1 serving milk.  1 serving strawberries. 3. Multiply each number of servings by 15 g:  2 servings rice x 15 g = 30 g.  1 serving corn x 15 g = 15 g.  1 serving milk x 15 g = 15  g.  1 serving strawberries x 15 g = 15 g. 4. Add together all of the amounts to find the total grams of carbohydrates eaten:  30 g + 15 g + 15 g + 15 g = 75 g of carbohydrates total. This information is not intended to replace advice given to you by your health care provider. Make sure you discuss any questions you have with your health care provider. Document Released: 11/29/2005 Document Revised: 06/18/2016 Document Reviewed: 05/12/2016 Elsevier Interactive Patient Education  2017 Elsevier Inc.  

## 2017-05-06 LAB — CMP14+EGFR
ALK PHOS: 35 IU/L — AB (ref 39–117)
ALT: 44 IU/L (ref 0–44)
AST: 53 IU/L — AB (ref 0–40)
Albumin/Globulin Ratio: 2.1 (ref 1.2–2.2)
Albumin: 4.9 g/dL (ref 3.5–5.5)
BUN/Creatinine Ratio: 30 — ABNORMAL HIGH (ref 9–20)
BUN: 40 mg/dL — AB (ref 6–24)
Bilirubin Total: 0.6 mg/dL (ref 0.0–1.2)
CO2: 22 mmol/L (ref 18–29)
CREATININE: 1.32 mg/dL — AB (ref 0.76–1.27)
Calcium: 9.8 mg/dL (ref 8.7–10.2)
Chloride: 98 mmol/L (ref 96–106)
GFR calc Af Amer: 68 mL/min/{1.73_m2} (ref >59)
GFR, EST NON AFRICAN AMERICAN: 59 mL/min/{1.73_m2} — AB (ref >59)
GLOBULIN, TOTAL: 2.3 g/dL (ref 1.5–4.5)
GLUCOSE: 147 mg/dL — AB (ref 65–99)
Potassium: 4.7 mmol/L (ref 3.5–5.2)
Sodium: 139 mmol/L (ref 134–144)
Total Protein: 7.2 g/dL (ref 6.0–8.5)

## 2017-05-06 LAB — LIPID PANEL
CHOL/HDL RATIO: 3.9 ratio (ref 0.0–5.0)
CHOLESTEROL TOTAL: 135 mg/dL (ref 100–199)
HDL: 35 mg/dL — AB (ref >39)
LDL CALC: 74 mg/dL (ref 0–99)
TRIGLYCERIDES: 131 mg/dL (ref 0–149)
VLDL CHOLESTEROL CAL: 26 mg/dL (ref 5–40)

## 2017-07-07 ENCOUNTER — Other Ambulatory Visit: Payer: Self-pay | Admitting: Nurse Practitioner

## 2017-07-08 NOTE — Telephone Encounter (Signed)
Please call in diazepam with   1 refills 

## 2017-07-08 NOTE — Telephone Encounter (Signed)
Last seen 05/05/17  MMM   If approved route to nurse to call into [facility]

## 2017-07-08 NOTE — Telephone Encounter (Signed)
Rx called to pharmacy

## 2017-07-10 ENCOUNTER — Other Ambulatory Visit: Payer: Self-pay | Admitting: Nurse Practitioner

## 2017-07-10 DIAGNOSIS — F5101 Primary insomnia: Secondary | ICD-10-CM

## 2017-07-10 IMAGING — CT CT RENAL STONE PROTOCOL
1 of 2 series · 15 of 32 positions shown, 19 images · non-contrast
Comparison: Testicular ultrasound 12/29/2015. CT abdomen pelvis
12/16/2011.

CLINICAL DATA: Right groin pain for [DATE] months.

EXAM:
CT ABDOMEN AND PELVIS WITHOUT CONTRAST
TECHNIQUE: Multidetector CT imaging of the abdomen and pelvis was performed
following the standard protocol without IV contrast.

[Series 2: axial st · axial · 0.84mm/px · z∈[-1188,-628]mm · 15 of 122 slices shown, 19 images]
[im 5/122  soft-tissue]
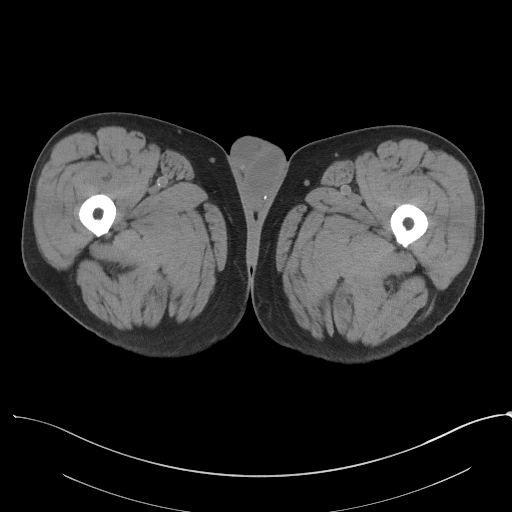
[im 5/122  bone]
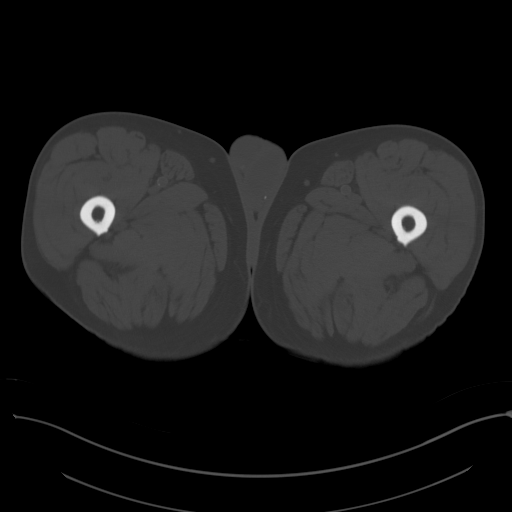
[im 15/122  soft-tissue]
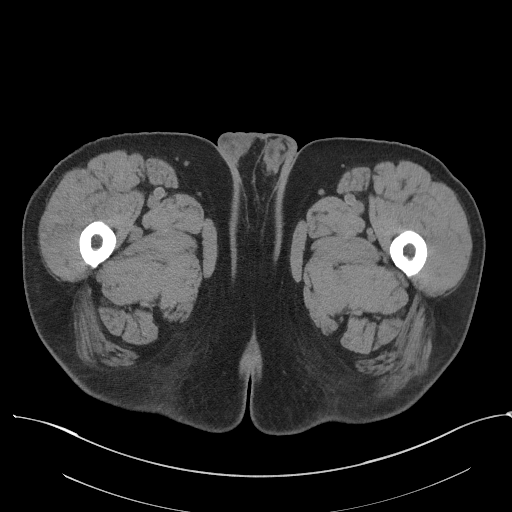
[im 25/122  soft-tissue]
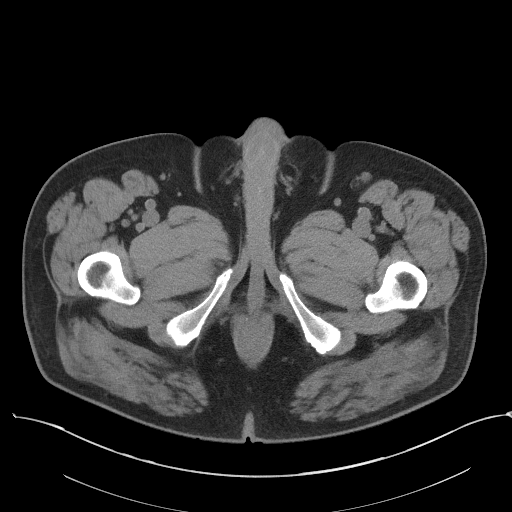
[im 34/122  soft-tissue]
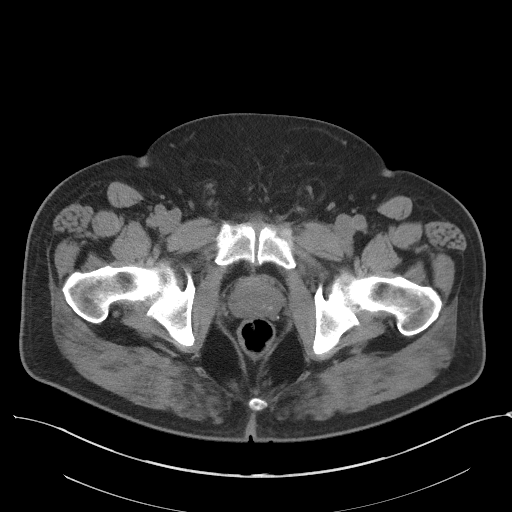
[im 44/122  soft-tissue]
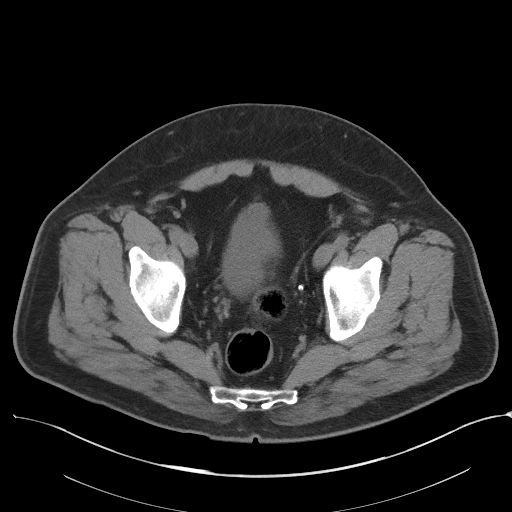
[im 54/122  soft-tissue]
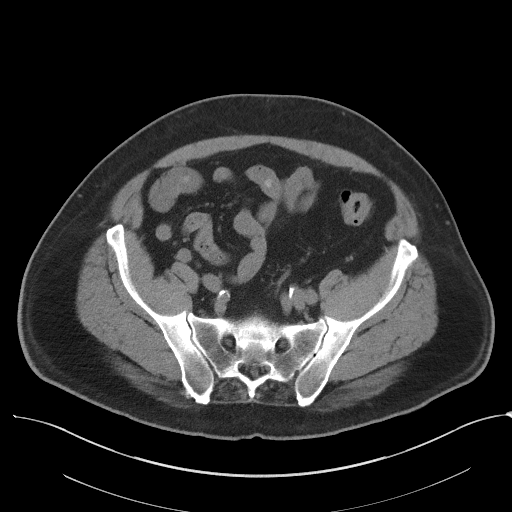
[im 63/122  soft-tissue]
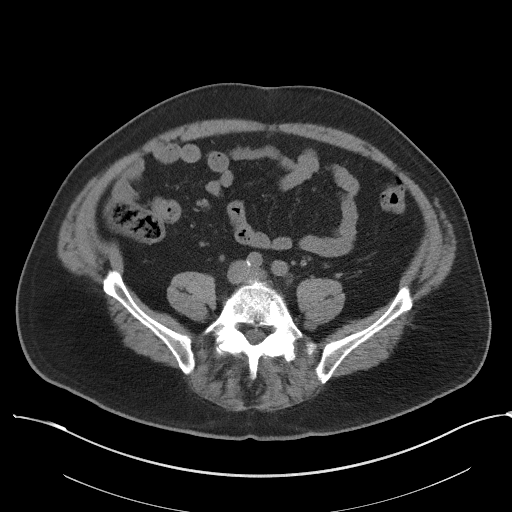
[im 68/122  soft-tissue]
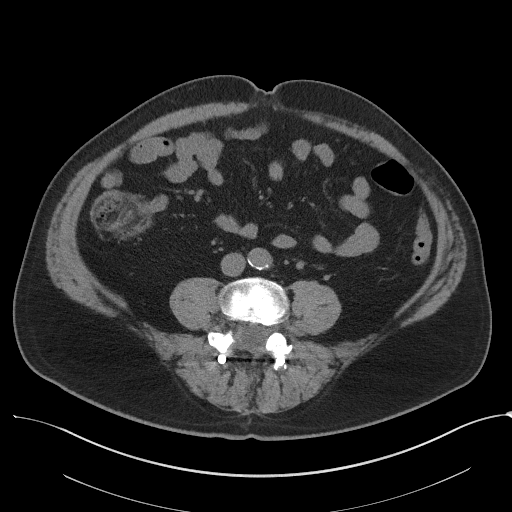
[im 78/122  soft-tissue]
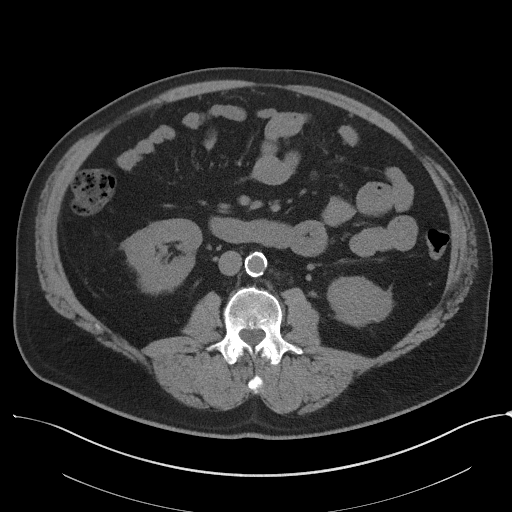
[im 78/122  bone]
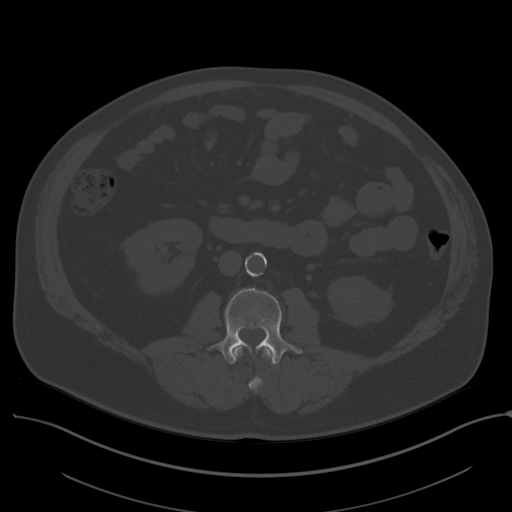
[im 88/122  soft-tissue]
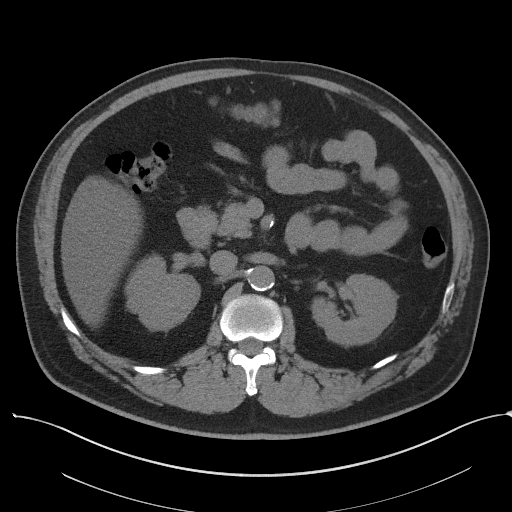
[im 97/122  soft-tissue]
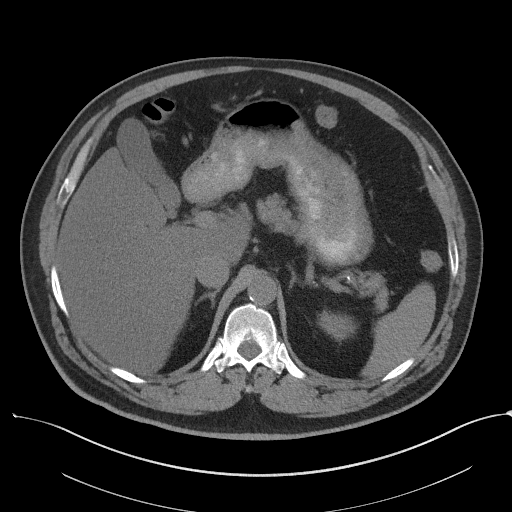
[im 102/122  lung]
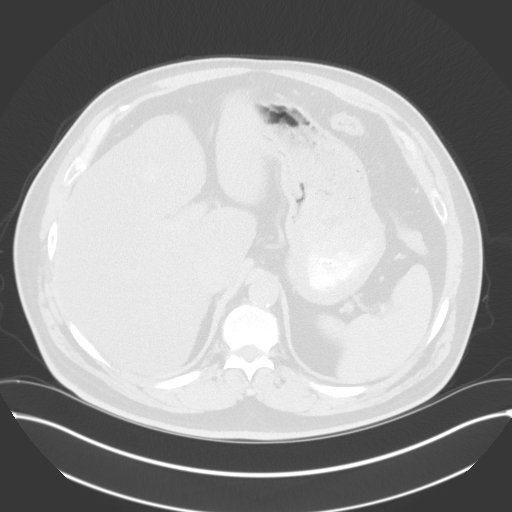
[im 107/122  soft-tissue]
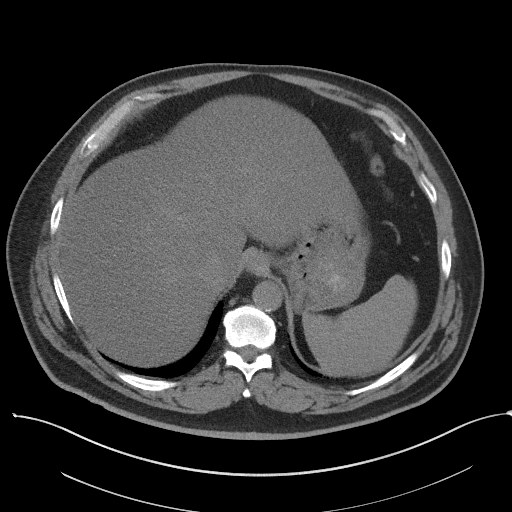
[im 107/122  lung]
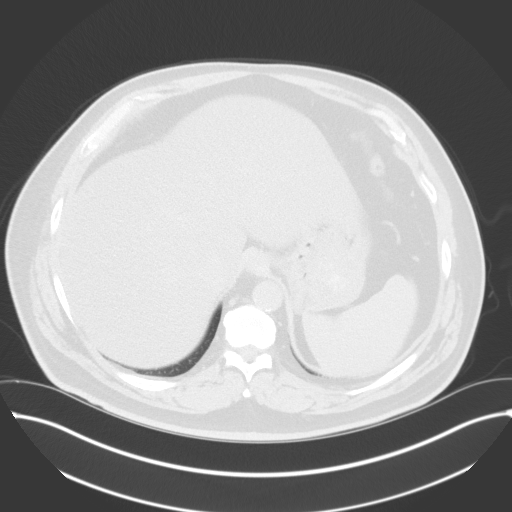
[im 112/122  lung]
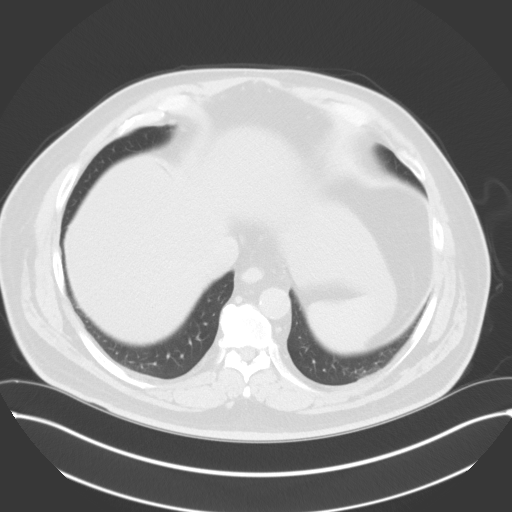
[im 117/122  soft-tissue]
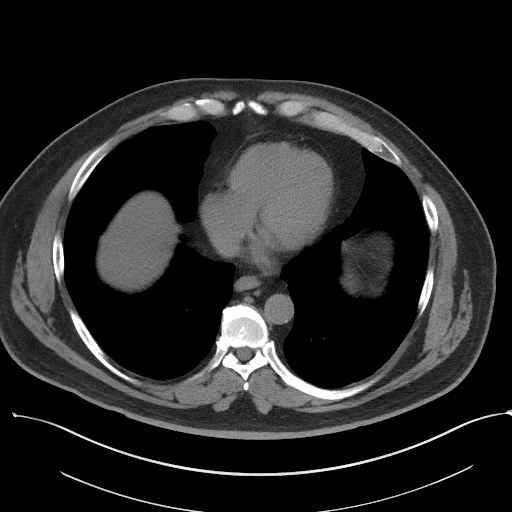
[im 117/122  lung]
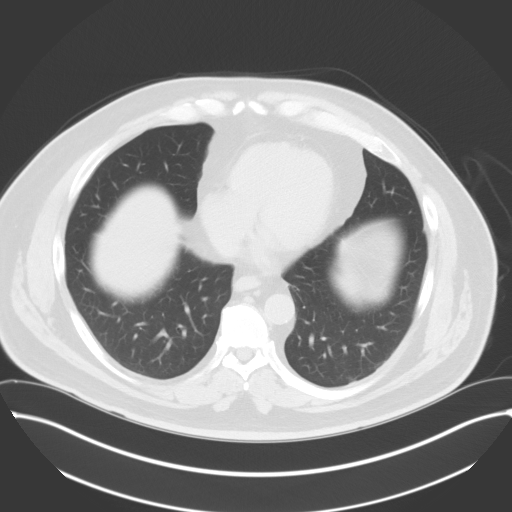

[15 of 32 positions shown; findings below may reference images not displayed]

FINDINGS: Lower chest: Lung bases show two 4 mm nodules in the right middle
lobe. Heart size normal. No pericardial or pleural effusion.

Hepatobiliary: Liver is decreased in attenuation diffusely with
sparing along the gallbladder fossa. Liver and gallbladder are
otherwise unremarkable. No biliary ductal dilatation.

Pancreas: Negative.

Spleen: Negative.

Adrenals/Urinary Tract: Adrenal glands are unremarkable.
Low-attenuation lesions in the kidneys measure up to 1.6 cm on the
right and are likely cysts although definitive characterization is
limited without post-contrast imaging. Ureters are decompressed.
Bladder is unremarkable.

Stomach/Bowel: Stomach, small bowel, appendix and colon are
unremarkable.

Vascular/Lymphatic: Atherosclerotic calcification of the arterial
vasculature without abdominal aortic aneurysm. No pathologically
enlarged lymph nodes.

Reproductive: Prostate is visualized.

Other: Small right inguinal hernia contains fat. No free fluid. Tiny
periumbilical hernia contains fat. Mesenteries and peritoneum are
unremarkable.

Musculoskeletal: No worrisome lytic or sclerotic lesions.
Postoperative changes in the lumbar spine.
IMPRESSION: 1. No acute findings to explain the patient's pain.
2. Two nodules in the right middle lobe, nonspecific. No follow-up
needed if patient is low-risk (and has no known or suspected primary
neoplasm). Non-contrast chest CT can be considered in 12 months if
patient is high-risk. This recommendation follows the consensus
statement: Guidelines for Management of Incidental Pulmonary Nodules
Detected on CT Images:From the [HOSPITAL] 7005; published
online before print (10.1148/[id number]).
3. Hepatic steatosis.
4. Aortic atherosclerosis.

## 2017-07-12 NOTE — Telephone Encounter (Signed)
Last filled 04/12/17, last seen 05/05/17. Call in

## 2017-07-12 NOTE — Telephone Encounter (Signed)
Please call in [place] with 1 refills

## 2017-07-13 NOTE — Telephone Encounter (Signed)
Rx phoned in.   

## 2017-08-08 NOTE — Progress Notes (Signed)
Detailed message to please return fobt card

## 2017-08-09 ENCOUNTER — Ambulatory Visit: Payer: PPO | Admitting: Nurse Practitioner

## 2017-08-25 ENCOUNTER — Ambulatory Visit: Payer: PPO | Admitting: Nurse Practitioner

## 2017-09-15 DIAGNOSIS — S46011A Strain of muscle(s) and tendon(s) of the rotator cuff of right shoulder, initial encounter: Secondary | ICD-10-CM | POA: Diagnosis not present

## 2017-09-20 DIAGNOSIS — M25561 Pain in right knee: Secondary | ICD-10-CM | POA: Diagnosis not present

## 2017-10-17 ENCOUNTER — Ambulatory Visit (INDEPENDENT_AMBULATORY_CARE_PROVIDER_SITE_OTHER): Payer: PPO | Admitting: Nurse Practitioner

## 2017-10-17 ENCOUNTER — Encounter: Payer: Self-pay | Admitting: Nurse Practitioner

## 2017-10-17 VITALS — BP 113/79 | HR 75 | Temp 97.2°F | Ht 70.0 in | Wt 225.0 lb

## 2017-10-17 DIAGNOSIS — F5101 Primary insomnia: Secondary | ICD-10-CM

## 2017-10-17 DIAGNOSIS — Z23 Encounter for immunization: Secondary | ICD-10-CM | POA: Diagnosis not present

## 2017-10-17 DIAGNOSIS — E785 Hyperlipidemia, unspecified: Secondary | ICD-10-CM

## 2017-10-17 DIAGNOSIS — K219 Gastro-esophageal reflux disease without esophagitis: Secondary | ICD-10-CM | POA: Diagnosis not present

## 2017-10-17 DIAGNOSIS — E119 Type 2 diabetes mellitus without complications: Secondary | ICD-10-CM

## 2017-10-17 DIAGNOSIS — I10 Essential (primary) hypertension: Secondary | ICD-10-CM

## 2017-10-17 DIAGNOSIS — M5442 Lumbago with sciatica, left side: Secondary | ICD-10-CM | POA: Diagnosis not present

## 2017-10-17 DIAGNOSIS — F3342 Major depressive disorder, recurrent, in full remission: Secondary | ICD-10-CM

## 2017-10-17 DIAGNOSIS — G8929 Other chronic pain: Secondary | ICD-10-CM | POA: Diagnosis not present

## 2017-10-17 DIAGNOSIS — M5441 Lumbago with sciatica, right side: Secondary | ICD-10-CM

## 2017-10-17 DIAGNOSIS — G629 Polyneuropathy, unspecified: Secondary | ICD-10-CM | POA: Diagnosis not present

## 2017-10-17 DIAGNOSIS — F3341 Major depressive disorder, recurrent, in partial remission: Secondary | ICD-10-CM | POA: Diagnosis not present

## 2017-10-17 LAB — BAYER DCA HB A1C WAIVED: HB A1C (BAYER DCA - WAIVED): 6.4 % (ref <7.0)

## 2017-10-17 MED ORDER — ZOLPIDEM TARTRATE 10 MG PO TABS: 10.0000 mg | ORAL_TABLET | Freq: Every evening | ORAL | 1 refills | Status: DC | PRN

## 2017-10-17 MED ORDER — ESOMEPRAZOLE MAGNESIUM 40 MG PO CPDR: 40.0000 mg | DELAYED_RELEASE_CAPSULE | Freq: Every day | ORAL | 1 refills | Status: DC

## 2017-10-17 MED ORDER — SERTRALINE HCL 100 MG PO TABS
ORAL_TABLET | ORAL | 1 refills | Status: DC
Start: 2017-10-17 — End: 2018-08-23

## 2017-10-17 MED ORDER — EZETIMIBE 10 MG PO TABS: 10.0000 mg | ORAL_TABLET | Freq: Every day | ORAL | 1 refills | Status: DC

## 2017-10-17 MED ORDER — METFORMIN HCL 1000 MG PO TABS: 1000.0000 mg | ORAL_TABLET | Freq: Two times a day (BID) | ORAL | 1 refills | Status: DC

## 2017-10-17 MED ORDER — GABAPENTIN 300 MG PO CAPS: 300.0000 mg | ORAL_CAPSULE | Freq: Two times a day (BID) | ORAL | 1 refills | Status: DC

## 2017-10-17 MED ORDER — OLMESARTAN MEDOXOMIL-HCTZ 40-25 MG PO TABS: ORAL_TABLET | ORAL | 1 refills | Status: DC

## 2017-10-17 MED ORDER — SIMVASTATIN 40 MG PO TABS: 40.0000 mg | ORAL_TABLET | Freq: Every day | ORAL | 1 refills | Status: DC

## 2017-10-17 NOTE — Progress Notes (Signed)
Subjective:    Patient ID: [name], male    DOB: [birth year], 56 y.o.   MRN: [id number]  HPI [name] is here today for follow up of chronic medical problem.  Outpatient Encounter Medications as of 10/17/2017  Medication Sig  . diazepam (VALIUM) 5 MG tablet TAKE ONE TO TWO TABLETS BY MOUTH EVERY 6 HOURS AS NEEDED  . esomeprazole (NEXIUM) 40 MG capsule Take 1 capsule (40 mg total) by mouth daily at 12 noon.  . ezetimibe (ZETIA) 10 MG tablet Take 1 tablet (10 mg total) by mouth daily.  . fish oil-omega-3 fatty acids 1000 MG capsule Take 1 g by mouth 2 (two) times daily.  . fluticasone (FLONASE) 50 MCG/ACT nasal spray USE 1 SPRAY IN EACH NOSTRIL ONCE DAILY  . gabapentin (NEURONTIN) 300 MG capsule Take 1 capsule (300 mg total) by mouth 2 (two) times daily.  Marland Kitchen glucose blood (ONE TOUCH ULTRA TEST) test strip Test 1 x per day and prn-- dx 250.01  . hydrocortisone (ANUSOL-HC) 2.5 % rectal cream Place 1 application rectally 2 (two) times daily.  Marland Kitchen loratadine (CLARITIN) 10 MG tablet TAKE ONE (1) TABLET EACH DAY  . meloxicam (MOBIC) 15 MG tablet Take 1 tablet (15 mg total) by mouth daily. with food  . metFORMIN (GLUCOPHAGE) 1000 MG tablet Take 1 tablet (1,000 mg total) by mouth 2 (two) times daily with a meal.  . Multiple Vitamin (MULTIVITAMIN WITH MINERALS) TABS Take 1 tablet by mouth daily.  Marland Kitchen olmesartan-hydrochlorothiazide (BENICAR HCT) 40-25 MG tablet TAKE ONE (1) TABLET EACH DAY  . olopatadine (PATANOL) 0.1 % ophthalmic solution PLACE 1 DROP IN [facility] EYE TWICE DAILY  . oxyCODONE-acetaminophen (PERCOCET) 10-325 MG tablet 1-2 tablets every 4-6 hrs as needed for pain  . sertraline (ZOLOFT) 100 MG tablet 2 po qd  . simvastatin (ZOCOR) 40 MG tablet Take 1 tablet (40 mg total) by mouth at bedtime.  . tadalafil (CIALIS) 5 MG tablet Take 1 tablet (5 mg total) by mouth daily as needed for erectile dysfunction.  . tamsulosin (FLOMAX) 0.4 MG CAPS capsule Take 1 capsule (0.4 mg total) by mouth  daily.  Marland Kitchen zolpidem (AMBIEN) 10 MG tablet TAKE ONE TABLET BY MOUTH AT BEDTIME AS NEEDED   No facility-administered encounter medications on file as of 10/17/2017.     1. Hyperlipidemia with target LDL less than 100  Does not watch diet very closely  2. Essential hypertension  No c/o chest pain, sob or headache. Does not check blood pressures at home. BP Readings from Last 3 Encounters:  05/05/17 101/70  01/31/17 96/70  01/04/17 129/81     3. Type 2 diabetes mellitus without complication, without long-term current use of insulin ([place]) last hgba1c was 7.6%. Patient refused medication change, wanted to try diet control. He has not been checking blood sugars. Needs new meter.  4. Gastroesophageal reflux disease without esophagitis  Takes nexium daily and still has occasional flare ups.  5. Neuropathy  Feet burn all the time. Takes neurotin which helps  6. Primary insomnia  Takes ambien nightly to sleep. Is unable to sleep without it.  7. Recurrent major depressive disorder, in full remission ([place])  Is currently on zoloft 199m daily. Is dong well. No c/o side effects [facility] 10/17[facility]2017 05/05/2017 01/31/2017  Decreased Interest 0 1 0  Down, Depressed, Hopeless 0 1 1  PHQ - 2 Score 0 2 1  Altered sleeping - 0 -  Tired, decreased energy - 1 -  Change in appetite - 1 -  Feeling bad or failure about yourself  - 0 -  Trouble concentrating - 0 -  Moving slowly or fidgety/restless - 0 -  Suicidal thoughts - 0 -  PHQ-9 Score - 4 -     8. Chronic bilateral low back pain with bilateral sciatica  takes valium and percocet for pain when gets real bad, but has nit needed refill on meds in quite some time. Rates back pain 1-2/10 currently.   * had eye exam in the last 3 months but cannot remember name of place. New complaints: * having rotator cuff surgery this Friday- dr. Noemi Chapel  Social history: Retired on disability for back pain   Review of Systems  Constitutional:  Negative for activity change and appetite change.  HENT: Negative.   Eyes: Negative for pain.  Respiratory: Negative for shortness of breath.   Cardiovascular: Negative for chest pain, palpitations and leg swelling.  Gastrointestinal: Negative for abdominal pain.  Endocrine: Negative for polydipsia.  Genitourinary: Negative.   Skin: Negative for rash.  Neurological: Negative for dizziness, weakness and headaches.  Hematological: Does not bruise/bleed easily.  Psychiatric/Behavioral: Negative.   All other systems reviewed and are negative.      Objective:   Physical Exam  Constitutional: He is oriented to person, place, and time. He appears well-developed and well-nourished.  HENT:  Head: Normocephalic.  Right Ear: External ear normal.  Left Ear: External ear normal.  Nose: Nose normal.  Mouth/Throat: Oropharynx is clear and moist.  Eyes: EOM are normal. Pupils are equal, round, and reactive to light.  Neck: Normal range of motion. Neck supple. No JVD present. No thyromegaly present.  Cardiovascular: Normal rate, regular rhythm, normal heart sounds and intact distal pulses. Exam reveals no gallop and no friction rub.  No murmur heard. Pulmonary/Chest: Effort normal and breath sounds normal. No respiratory distress. He has no wheezes. He has no rales. He exhibits no tenderness.  Abdominal: Soft. Bowel sounds are normal. He exhibits no mass. There is no tenderness.  Genitourinary: Prostate normal and penis normal.  Musculoskeletal: Normal range of motion. He exhibits no edema.  Lymphadenopathy:    He has no cervical adenopathy.  Neurological: He is alert and oriented to person, place, and time. No cranial nerve deficit.  Skin: Skin is warm and dry.  Psychiatric: He has a normal mood and affect. His behavior is normal. Judgment and thought content normal.    BP 113/79   Pulse 75   Temp (!) 97.2 F (36.2 C) (Oral)   Ht '5\' 10"'  (1.778 m)   Wt 225 lb (102.1 kg)   BMI 32.28 kg/m    hgba1c 6.4%    Assessment & Plan:  1. Hyperlipidemia with target LDL less than 100 Low fat diet - Lipid panel - simvastatin (ZOCOR) 40 MG tablet; Take 1 tablet (40 mg total) at bedtime by mouth.  Dispense: 90 tablet; Refill: 1 - ezetimibe (ZETIA) 10 MG tablet; Take 1 tablet (10 mg total) daily by mouth.  Dispense: 90 tablet; Refill: 1  2. Essential hypertension Low sodium diet - CMP14+EGFR - olmesartan-hydrochlorothiazide (BENICAR HCT) 40-25 MG tablet; TAKE ONE (1) TABLET EACH DAY  Dispense: 90 tablet; Refill: 1  3. Type 2 diabetes mellitus without complication, without long-term current use of insulin (HCC) Continue to watch carbs in diet - Bayer DCA Hb A1c Waived - Microalbumin / creatinine urine ratio - metFORMIN (GLUCOPHAGE) 1000 MG tablet; Take 1 tablet (1,000 mg total) 2 (two) times  daily with a meal by mouth.  Dispense: 180 tablet; Refill: 1  4. Gastroesophageal reflux disease without esophagitis Avoid spicy foods Do not eat 2 hours prior to bedtime - esomeprazole (NEXIUM) 40 MG capsule; Take 1 capsule (40 mg total) daily at 12 noon by mouth.  Dispense: 90 capsule; Refill: 1  5. Neuropathy Do not go barefooted Check feet daily  6. Primary insomnia Bedtime routine - zolpidem (AMBIEN) 10 MG tablet; Take 1 tablet (10 mg total) at bedtime as needed by mouth.  Dispense: 90 tablet; Refill: 1  7. Recurrent major depressive disorder, in full remission ([place]) Stress management - sertraline (ZOLOFT) 100 MG tablet; 2 po qd  Dispense: 180 tablet; Refill: 1  8. Chronic bilateral low back pain with bilateral sciatica Moist heat Daily stretching exercises - gabapentin (NEURONTIN) 300 MG capsule; Take 1 capsule (300 mg total) 2 (two) times daily by mouth.  Dispense: 180 capsule; Refill: 1   Labs pending Health maintenance reviewed Diet and exercise encouraged Continue all meds Follow up  In 3 months   [place], FNP

## 2017-10-17 NOTE — Patient Instructions (Signed)
Peripheral Neuropathy Peripheral neuropathy is a type of nerve damage. It affects nerves that carry signals between the spinal cord and other parts of the body. These are called peripheral nerves. With peripheral neuropathy, one nerve or a group of nerves may be damaged. What are the causes? Many things can damage peripheral nerves. For some people with peripheral neuropathy, the cause is unknown. Some causes include:  Diabetes. This is the most common cause of peripheral neuropathy.  Injury to a nerve.  Pressure or stress on a nerve that lasts a long time.  Too little vitamin B. Alcoholism can lead to this.  Infections.  Autoimmune diseases, such as multiple sclerosis and systemic lupus erythematosus.  Inherited nerve diseases.  Some medicines, such as cancer drugs.  Toxic substances, such as lead and mercury.  Too little blood flowing to the legs.  Kidney disease.  Thyroid disease.  What are the signs or symptoms? Different people have different symptoms. The symptoms you have will depend on which of your nerves is damaged. Common symptoms include:  Loss of feeling (numbness) in the feet and hands.  Tingling in the feet and hands.  Pain that burns.  Very sensitive skin.  Weakness.  Not being able to move a part of the body (paralysis).  Muscle twitching.  Clumsiness or poor coordination.  Loss of balance.  Not being able to control your bladder.  Feeling dizzy.  Sexual problems.  How is this diagnosed? Peripheral neuropathy is a symptom, not a disease. Finding the cause of peripheral neuropathy can be hard. To figure that out, your health care provider will take a medical history and do a physical exam. A neurological exam will also be done. This involves checking things affected by your brain, spinal cord, and nerves (nervous system). For example, your health care provider will check your reflexes, how you move, and what you can feel. Other types of tests  may also be ordered, such as:  Blood tests.  A test of the fluid in your spinal cord.  Imaging tests, such as CT scans or an MRI.  Electromyography (EMG). This test checks the nerves that control muscles.  Nerve conduction velocity tests. These tests check how fast messages pass through your nerves.  Nerve biopsy. A small piece of nerve is removed. It is then checked under a microscope.  How is this treated?  Medicine is often used to treat peripheral neuropathy. Medicines may include: ? Pain-relieving medicines. Prescription or over-the-counter medicine may be suggested. ? Antiseizure medicine. This may be used for pain. ? Antidepressants. These also may help ease pain from neuropathy. ? Lidocaine. This is a numbing medicine. You might wear a patch or be given a shot. ? Mexiletine. This medicine is typically used to help control irregular heart rhythms.  Surgery. Surgery may be needed to relieve pressure on a nerve or to destroy a nerve that is causing pain.  Physical therapy to help movement.  Assistive devices to help movement. Follow these instructions at home:  Only take over-the-counter or prescription medicines as directed by your health care provider. Follow the instructions carefully for any given medicines. Do not take any other medicines without first getting approval from your health care provider.  If you have diabetes, work closely with your health care provider to keep your blood sugar under control.  If you have numbness in your feet: ? Check every day for signs of injury or infection. Watch for redness, warmth, and swelling. ? Wear padded socks and comfortable   shoes. These help protect your feet.  Do not do things that put pressure on your damaged nerve.  Do not smoke. Smoking keeps blood from getting to damaged nerves.  Avoid or limit alcohol. Too much alcohol can cause a lack of B vitamins. These vitamins are needed for healthy nerves.  Develop a good  support system. Coping with peripheral neuropathy can be stressful. Talk to a mental health specialist or join a support group if you are struggling.  Follow up with your health care provider as directed. Contact a health care provider if:  You have new signs or symptoms of peripheral neuropathy.  You are struggling emotionally from dealing with peripheral neuropathy.  You have a fever. Get help right away if:  You have an injury or infection that is not healing.  You feel very dizzy or begin vomiting.  You have chest pain.  You have trouble breathing. This information is not intended to replace advice given to you by your health care provider. Make sure you discuss any questions you have with your health care provider. Document Released: 11/19/2002 Document Revised: 05/06/2016 Document Reviewed: 08/06/2013 Elsevier Interactive Patient Education  2017 [employer]vier Inc.  

## 2017-10-18 LAB — CMP14+EGFR
A/G RATIO: 2.4 — AB (ref 1.2–2.2)
ALT: 34 IU/L (ref 0–44)
AST: 26 IU/L (ref 0–40)
Albumin: 5 g/dL (ref 3.5–5.5)
Alkaline Phosphatase: 44 IU/L (ref 39–117)
BILIRUBIN TOTAL: 0.4 mg/dL (ref 0.0–1.2)
BUN / CREAT RATIO: 20 (ref 9–20)
BUN: 25 mg/dL — ABNORMAL HIGH (ref 6–24)
CHLORIDE: 100 mmol/L (ref 96–106)
CO2: 23 mmol/L (ref 20–29)
Calcium: 9.9 mg/dL (ref 8.7–10.2)
Creatinine, Ser: 1.24 mg/dL (ref 0.76–1.27)
GFR calc non Af Amer: 64 mL/min/{1.73_m2} (ref >59)
GFR, EST AFRICAN AMERICAN: 74 mL/min/{1.73_m2} (ref >59)
GLOBULIN, TOTAL: 2.1 g/dL (ref 1.5–4.5)
Glucose: 95 mg/dL (ref 65–99)
POTASSIUM: 4.5 mmol/L (ref 3.5–5.2)
SODIUM: 140 mmol/L (ref 134–144)
TOTAL PROTEIN: 7.1 g/dL (ref 6.0–8.5)

## 2017-10-18 LAB — LIPID PANEL
CHOLESTEROL TOTAL: 184 mg/dL (ref 100–199)
Chol/HDL Ratio: 4.7 ratio (ref 0.0–5.0)
HDL: 39 mg/dL — ABNORMAL LOW (ref >39)
LDL Calculated: 99 mg/dL (ref 0–99)
TRIGLYCERIDES: 232 mg/dL — AB (ref 0–149)
VLDL Cholesterol Cal: 46 mg/dL — ABNORMAL HIGH (ref 5–40)

## 2017-10-21 DIAGNOSIS — M7541 Impingement syndrome of right shoulder: Secondary | ICD-10-CM | POA: Diagnosis not present

## 2017-10-21 DIAGNOSIS — M19011 Primary osteoarthritis, right shoulder: Secondary | ICD-10-CM | POA: Diagnosis not present

## 2017-10-21 DIAGNOSIS — M75111 Incomplete rotator cuff tear or rupture of right shoulder, not specified as traumatic: Secondary | ICD-10-CM | POA: Diagnosis not present

## 2017-10-21 DIAGNOSIS — G8918 Other acute postprocedural pain: Secondary | ICD-10-CM | POA: Diagnosis not present

## 2017-10-21 DIAGNOSIS — M24111 Other articular cartilage disorders, right shoulder: Secondary | ICD-10-CM | POA: Diagnosis not present

## 2017-10-27 DIAGNOSIS — M19011 Primary osteoarthritis, right shoulder: Secondary | ICD-10-CM | POA: Diagnosis not present

## 2017-10-28 ENCOUNTER — Encounter: Payer: Self-pay | Admitting: Physical Therapy

## 2017-10-28 ENCOUNTER — Ambulatory Visit: Payer: PPO | Attending: Orthopedic Surgery | Admitting: Physical Therapy

## 2017-10-28 DIAGNOSIS — M791 Myalgia, unspecified site: Secondary | ICD-10-CM | POA: Insufficient documentation

## 2017-10-28 DIAGNOSIS — M6281 Muscle weakness (generalized): Secondary | ICD-10-CM | POA: Insufficient documentation

## 2017-10-28 DIAGNOSIS — R2689 Other abnormalities of gait and mobility: Secondary | ICD-10-CM | POA: Diagnosis not present

## 2017-10-28 DIAGNOSIS — M25611 Stiffness of right shoulder, not elsewhere classified: Secondary | ICD-10-CM | POA: Diagnosis not present

## 2017-10-28 DIAGNOSIS — M25511 Pain in right shoulder: Secondary | ICD-10-CM | POA: Insufficient documentation

## 2017-10-28 NOTE — Therapy (Signed)
[facility] [place], [place], [address] Phone: [phone]   Fax:  [phone]  Physical Therapy Evaluation  Patient Details  Name: [name] MRN: [id number] Date of Birth: [birth year] Referring Provider: Elsie Saas, MD   Encounter Date: 10/28/2017  PT End of Session - 10/28/17 1324    Visit Number  1    Number of Visits  16    Date for PT Re-Evaluation  12/28/17    Authorization Type  g -code every 10th visit    PT Start Time  0905    PT Stop Time  0955    PT Time Calculation (min)  50 min    Activity Tolerance  Patient tolerated treatment well;No increased pain    Behavior During Therapy  WFL for tasks assessed/performed       Past Medical History:  Diagnosis Date  . Arthritis   . Chronic back pain    stenosis  . Constipation    with pain meds  . Depression    takes Zoloft daily  . Diabetes mellitus without complication ([place])    takes Metformni daily  . Dizziness   . GERD (gastroesophageal reflux disease)    takes Nexium daily  . Headache(784.0)    occasionally  . Hyperlipidemia    takes Zetia daily  . Hypertension    takes Benicar daily  . Impingement syndrome of left shoulder   . Insomnia    takes Ambien nightly  . Joint pain   . Left rotator cuff tear   . Pneumonia 1986   walking  . Seasonal allergies    takes Claritin daily  . Tear of medial meniscus of right knee   . Urinary frequency    takes Flomax daily  . Urinary urgency     Past Surgical History:  Procedure Laterality Date  . BACK SURGERY  [id number]   lumb fusion  . COLONOSCOPY    . left elbow surgery    . LEFT SHOULDER ARTHROSCOPY WITH DEBRIDEMENT, PARTIAL ROTATOR CUFF REPAIR AND SUBACROMIAL DECOMPRESSION AND SAD ACD DISTAL CLAVICULECTOMY Left 09/26/2013   Performed by Lorn Junes, MD at [facility]  . LITHOTRIPSY    . NASAL SEPTUM SURGERY    . POSTERIOR LUMBAR FUSION 1 LEVEL N/A 12/26/2012   Performed  by Charlie Pitter, MD at [facility] NEURO ORS  . removal of kidney stones  1981   lt open removal  . RIGHT KNEE ARTHROSCOPY WITH PARTIAL MEDIAL and lateral chrondroplasty MENISECTOMY Right 09/26/2013   Performed by Lorn Junes, MD at [facility]  . TONSILLECTOMY      There were no vitals filed for this visit.   Subjective Assessment - 10/28/17 0921    Subjective  Pt arriving to therapy s/p R rotator cuff repair / SAD/ DCE on 10/21/17. Pt arriving in sling with MD protocol from [facility].     Limitations  House hold activities;Lifting    Patient Stated Goals  Use my arm again    Currently in Pain?  Yes    Pain Score  4     Pain Location  Shoulder    Pain Orientation  Right    Pain Descriptors / Indicators  Aching;Sore    Pain Frequency  Constant    Aggravating Factors   moving R shoulder    Pain Relieving Factors  resting, pain meds    Multiple Pain Sites  No  [facility] PT Assessment - 10/28/17 0001      Assessment   Medical Diagnosis  R rotator cuff repair/ SAD/ DCE    Referring Provider  Elsie Saas, MD      Precautions   Precautions  Shoulder      Balance Screen   Has the patient fallen in the past 6 months  No    Is the patient reluctant to leave their home because of a fear of falling?   No      Home Environment   Living Environment  Private residence    Living Arrangements  Alone    Type of [place] Access  Stairs to enter    Entrance Stairs-Number of Steps  3    Entrance Stairs-Rails  Cannot reach both    [place]  One level      Prior Function   Level of Independence  Independent    Leisure  golf      Cognition   Overall Cognitive Status  Within Functional Limits for tasks assessed      ROM / Strength   AROM / PROM / Strength  AROM;PROM      AROM   AROM Assessment Site  --    Right/Left Shoulder  --    Right Shoulder Flexion  --    Right Shoulder ABduction  --    Right Shoulder Internal  Rotation  --    Right Shoulder External Rotation  --      PROM   PROM Assessment Site  Shoulder    Right/Left Shoulder  Right    Right Shoulder Flexion  75 Degrees    Right Shoulder ABduction  30 Degrees    Right Shoulder Internal Rotation  -- to stomach    Right Shoulder External Rotation  30 Degrees      Palpation   Palpation comment  Tenderness around incision sites, R biceps tendon, R posteior capsule      Ambulation/Gait   Gait Comments  Pt amb with sling on R UE              Objective measurements completed on examination: See above findings.      [place] Adult PT Treatment/Exercise - 10/28/17 0001      Exercises   Exercises  Shoulder      Shoulder Exercises: Seated   Other Seated Exercises  grip strength with yellow theraputty      Shoulder Exercises: ROM/Strengthening   Pendulum  circles, side/side, front/back      Modalities   Modalities  Electrical Stimulation;Vasopneumatic      Electrical Stimulation   Electrical Stimulation Location  R shoulder    Electrical Stimulation Action  IFC 80-150 Hz x 15 minutes, intensity to tolerance    Electrical Stimulation Goals  Tone;Pain      Vasopneumatic   Number Minutes Vasopneumatic   15 minutes    Vasopnuematic Location   Shoulder    Vasopneumatic Pressure  Medium    Vasopneumatic Temperature   34      Manual Therapy   Manual therapy comments  gentle grade 1 GH mobilizations                  PT Long Term Goals - 10/28/17 1338      PT LONG TERM GOAL #1   Title  Pt will be independent in his HEP and progression    Time  8    Period  Weeks  Status  New    Target Date  12/23/17      PT LONG TERM GOAL #2   Title  Pt will be able to increase his R shoulder flexion to >/= 150 degrees actively in order to improve functional mobility.     Time  8    Period  Weeks    Target Date  12/23/17      PT LONG TERM GOAL #3   Title  Pt will be able to increase his R ER to >/= 65 degrees in order to  improve functional mobilty.     Time  8    Period  Weeks    Status  New    Target Date  12/23/17      PT LONG TERM GOAL #4   Title  pt will be able to report pain of </= 2/10 with ADL's.     Time  8    Period  Weeks    Status  New             Plan - 2017-11-12 1326    Clinical Impression Statement  Pt arriving to therapy reoprting pain of 4/10 following his R rotator cuff repair SAD/DCE on 10/21/17. Pt with limited ROM and increased pain in R shoulder. Skilled PT needed to progress pt towrad his PLOF.     History and Personal Factors relevant to plan of care:  previous left rotator cuff repair    Clinical Presentation  Stable    Clinical Decision Making  Low    Rehab Potential  Good    PT Frequency  2x / week    PT Treatment/Interventions  Moist Heat;Functional mobility training;Neuromuscular re-education;Therapeutic exercise;Therapeutic activities;Patient/family education;Manual techniques;Scar mobilization;Manual lymph drainage;Taping;ADLs/Self Care Home Management;Electrical Stimulation;Passive range of motion    PT Next Visit Plan  Follow MD protocol    PT Home Exercise Plan  Pendulums    Consulted and Agree with Plan of Care  Patient       Patient will benefit from skilled therapeutic intervention in order to improve the following deficits and impairments:  Decreased range of motion, Pain, Impaired UE functional use, Postural dysfunction  Visit Diagnosis: Acute pain of right shoulder  Stiffness of right shoulder, not elsewhere classified  G-Codes - 2017/11/12 1343    Functional Assessment Tool Used (Outpatient Only)  FOTO: 96% limited on 10/18/17, clinical judgement    Functional Limitation  Carrying, moving and handling objects    Carrying, Moving and Handling Objects Current Status [phone])  At least 80 percent but less than 100 percent impaired, limited or restricted    Carrying, Moving and Handling Objects Goal Status (G2694)  At least 40 percent but less than 60  percent impaired, limited or restricted        Problem List Patient Active Problem List   Diagnosis Date Noted  . Hydrocele sac 01/06/2016  . Varicocele 01/06/2016  . Erectile dysfunction of organic origin 12/20/2015  . BPH with obstruction/lower urinary tract symptoms 12/20/2015  . History of hypogonadism 12/20/2015  . History of nephrolithiasis 12/20/2015  . Neuropathy 11/20/2015  . GERD (gastroesophageal reflux disease)   . Chronic back pain   . Hypertension   . Allergic rhinitis 07/25/2013  . Insomnia 07/25/2013  . Diabetes ([place]) 03/16/2013  . Hyperlipidemia with target LDL less than 100 03/16/2013  . Depression 03/16/2013  . Lumbar stenosis with neurogenic claudication 12/26/2012    Oretha Caprice, MPT 11-12-2017, 1:53 PM  [facility] [address]  [place], [place], [address] Phone: [phone]   Fax:  [phone]  Name: [name] MRN: [id number] Date of Birth: 04/18/1961

## 2017-11-01 ENCOUNTER — Ambulatory Visit: Payer: PPO | Admitting: *Deleted

## 2017-11-01 DIAGNOSIS — M25611 Stiffness of right shoulder, not elsewhere classified: Secondary | ICD-10-CM

## 2017-11-01 DIAGNOSIS — R2689 Other abnormalities of gait and mobility: Secondary | ICD-10-CM

## 2017-11-01 DIAGNOSIS — M25511 Pain in right shoulder: Secondary | ICD-10-CM | POA: Diagnosis not present

## 2017-11-01 DIAGNOSIS — M791 Myalgia, unspecified site: Secondary | ICD-10-CM

## 2017-11-01 DIAGNOSIS — M6281 Muscle weakness (generalized): Secondary | ICD-10-CM

## 2017-11-01 NOTE — Therapy (Signed)
[facility] [place], [place], [address] Phone: [phone]   Fax:  [phone]  Physical Therapy Treatment  Patient Details  Name: [name] MRN: [id number] Date of Birth: December 07, 1961 Referring Provider: Elsie Saas, MD   Encounter Date: 11/01/2017  PT End of Session - 11/01/17 1452    Visit Number  2    Number of Visits  16    Date for PT Re-Evaluation  12/28/17    Authorization Type  g -code every 10th visit    PT Start Time  1345    PT Stop Time  1439    PT Time Calculation (min)  54 min       Past Medical History:  Diagnosis Date  . Arthritis   . Chronic back pain    stenosis  . Constipation    with pain meds  . Depression    takes Zoloft daily  . Diabetes mellitus without complication ([place])    takes Metformni daily  . Dizziness   . GERD (gastroesophageal reflux disease)    takes Nexium daily  . Headache(784.0)    occasionally  . Hyperlipidemia    takes Zetia daily  . Hypertension    takes Benicar daily  . Impingement syndrome of left shoulder   . Insomnia    takes Ambien nightly  . Joint pain   . Left rotator cuff tear   . Pneumonia 1986   walking  . Seasonal allergies    takes Claritin daily  . Tear of medial meniscus of right knee   . Urinary frequency    takes Flomax daily  . Urinary urgency     Past Surgical History:  Procedure Laterality Date  . BACK SURGERY  [id number]   lumb fusion  . COLONOSCOPY    . KNEE ARTHROSCOPY WITH MEDIAL MENISECTOMY Right 09/26/2013   Procedure: RIGHT KNEE ARTHROSCOPY WITH PARTIAL MEDIAL and lateral chrondroplasty MENISECTOMY;  Surgeon: Lorn Junes, MD;  Location: [facility];  Service: Orthopedics;  Laterality: Right;  . left elbow surgery    . LITHOTRIPSY    . NASAL SEPTUM SURGERY    . removal of kidney stones  1981   lt open removal  . SHOULDER ARTHROSCOPY WITH ROTATOR CUFF REPAIR AND SUBACROMIAL DECOMPRESSION Left 09/26/2013    Procedure: LEFT SHOULDER ARTHROSCOPY WITH DEBRIDEMENT, PARTIAL ROTATOR CUFF REPAIR AND SUBACROMIAL DECOMPRESSION AND SAD ACD DISTAL CLAVICULECTOMY;  Surgeon: Lorn Junes, MD;  Location: [facility];  Service: Orthopedics;  Laterality: Left;  . TONSILLECTOMY      There were no vitals filed for this visit.                   [place] Adult PT Treatment/Exercise - 11/01/17 0001      Exercises   Exercises  Shoulder      Modalities   Modalities  Electrical Stimulation;Vasopneumatic      Electrical Stimulation   Electrical Stimulation Location  R shoulder  IFC x 15 mins 80-150hz      Electrical Stimulation Goals  Tone;Pain      Vasopneumatic   Number Minutes Vasopneumatic   15 minutes    Vasopnuematic Location   Shoulder    Vasopneumatic Pressure  Medium    Vasopneumatic Temperature   36      Manual Therapy   Manual Therapy  Passive ROM    Passive ROM  PROM to RT shldr  flexion and ER and IR to abdomen  PT Long Term Goals - 10/28/17 1338      PT LONG TERM GOAL #1   Title  Pt will be independent in his HEP and progression    Time  8    Period  Weeks    Status  New    Target Date  12/23/17      PT LONG TERM GOAL #2   Title  Pt will be able to increase his R shoulder flexion to >/= 150 degrees actively in order to improve functional mobility.     Time  8    Period  Weeks    Target Date  12/23/17      PT LONG TERM GOAL #3   Title  Pt will be able to increase his R ER to >/= 65 degrees in order to improve functional mobilty.     Time  8    Period  Weeks    Status  New    Target Date  12/23/17      PT LONG TERM GOAL #4   Title  pt will be able to report pain of </= 2/10 with ADL's.     Time  8    Period  Weeks    Status  New            Plan - 11/01/17 1438    Clinical Impression Statement  Pt arrived  to clinic doing fairly well in sling. Rx focused on PROM of RT shldr for flexion, Er, and IR. Normal  modality response    Clinical Presentation  Stable    Clinical Decision Making  Low    Rehab Potential  Good    PT Frequency  2x / week    PT Treatment/Interventions  Moist Heat;Functional mobility training;Neuromuscular re-education;Therapeutic exercise;Therapeutic activities;Patient/family education;Manual techniques;Scar mobilization;Manual lymph drainage;Taping;ADLs/Self Care Home Management;Electrical Stimulation;Passive range of motion    PT Next Visit Plan  Follow MD protocol    PT Home Exercise Plan  Pendulums    Consulted and Agree with Plan of Care  Patient       Patient will benefit from skilled therapeutic intervention in order to improve the following deficits and impairments:  Decreased range of motion, Pain, Impaired UE functional use, Postural dysfunction  Visit Diagnosis: Acute pain of right shoulder  Stiffness of right shoulder, not elsewhere classified  Myalgia  Other abnormalities of gait and mobility  Muscle weakness (generalized)     Problem List Patient Active Problem List   Diagnosis Date Noted  . Hydrocele sac 01/06/2016  . Varicocele 01/06/2016  . Erectile dysfunction of organic origin 12/20/2015  . BPH with obstruction/lower urinary tract symptoms 12/20/2015  . History of hypogonadism 12/20/2015  . History of nephrolithiasis 12/20/2015  . Neuropathy 11/20/2015  . GERD (gastroesophageal reflux disease)   . Chronic back pain   . Hypertension   . Allergic rhinitis 07/25/2013  . Insomnia 07/25/2013  . Diabetes ([place]) 03/16/2013  . Hyperlipidemia with target LDL less than 100 03/16/2013  . Depression 03/16/2013  . Lumbar stenosis with neurogenic claudication 12/26/2012    Khaniya Tenaglia,CHRIS, PTA 11/01/2017, 2:53 PM  [facility] [place], [place], [address] Phone: [phone]   Fax:  [phone]  Name: [name] MRN: [id number] Date of Birth: 30-Jan-1961

## 2017-11-07 ENCOUNTER — Ambulatory Visit: Payer: PPO | Admitting: Physical Therapy

## 2017-11-07 ENCOUNTER — Encounter: Payer: Self-pay | Admitting: Physical Therapy

## 2017-11-07 DIAGNOSIS — M25611 Stiffness of right shoulder, not elsewhere classified: Secondary | ICD-10-CM

## 2017-11-07 DIAGNOSIS — M25511 Pain in right shoulder: Secondary | ICD-10-CM | POA: Diagnosis not present

## 2017-11-07 NOTE — Therapy (Signed)
[facility] [place], [place], [address] Phone: [phone]   Fax:  [phone]  Physical Therapy Treatment  Patient Details  Name: [name] MRN: [id number] Date of Birth: [birth year] Referring Provider: Elsie Saas, MD   Encounter Date: 11/07/2017  PT End of Session - 11/07/17 1104    Visit Number  3    Number of Visits  16    Date for PT Re-Evaluation  12/28/17    Authorization Type  g -code every 10th visit    PT Start Time  1035    PT Stop Time  1116    PT Time Calculation (min)  41 min    Activity Tolerance  Patient tolerated treatment well    Behavior During Therapy  [facility] for tasks assessed/performed       Past Medical History:  Diagnosis Date  . Arthritis   . Chronic back pain    stenosis  . Constipation    with pain meds  . Depression    takes Zoloft daily  . Diabetes mellitus without complication ([place])    takes Metformni daily  . Dizziness   . GERD (gastroesophageal reflux disease)    takes Nexium daily  . Headache(784.0)    occasionally  . Hyperlipidemia    takes Zetia daily  . Hypertension    takes Benicar daily  . Impingement syndrome of left shoulder   . Insomnia    takes Ambien nightly  . Joint pain   . Left rotator cuff tear   . Pneumonia 1986   walking  . Seasonal allergies    takes Claritin daily  . Tear of medial meniscus of right knee   . Urinary frequency    takes Flomax daily  . Urinary urgency     Past Surgical History:  Procedure Laterality Date  . BACK SURGERY  [id number]   lumb fusion  . COLONOSCOPY    . KNEE ARTHROSCOPY WITH MEDIAL MENISECTOMY Right 09/26/2013   Procedure: RIGHT KNEE ARTHROSCOPY WITH PARTIAL MEDIAL and lateral chrondroplasty MENISECTOMY;  Surgeon: Lorn Junes, MD;  Location: [place];  Service: Orthopedics;  Laterality: Right;  . left elbow surgery    . LITHOTRIPSY    . NASAL SEPTUM SURGERY    . removal of kidney stones   1981   lt open removal  . SHOULDER ARTHROSCOPY WITH ROTATOR CUFF REPAIR AND SUBACROMIAL DECOMPRESSION Left 09/26/2013   Procedure: LEFT SHOULDER ARTHROSCOPY WITH DEBRIDEMENT, PARTIAL ROTATOR CUFF REPAIR AND SUBACROMIAL DECOMPRESSION AND SAD ACD DISTAL CLAVICULECTOMY;  Surgeon: Lorn Junes, MD;  Location: [place];  Service: Orthopedics;  Laterality: Left;  . TONSILLECTOMY      There were no vitals filed for this visit.  Subjective Assessment - 11/07/17 1038    Subjective  Patient reported doing good after last treatment some ongoing discomfort, difficulty sleeping    Limitations  House hold activities;Lifting    Patient Stated Goals  Use my arm again    Currently in Pain?  Yes    Pain Score  2     Pain Location  Shoulder    Pain Orientation  Right    Pain Descriptors / Indicators  Aching;Discomfort    Pain Type  Surgical pain    Pain Onset  1 to 4 weeks ago    Aggravating Factors   movement    Pain Relieving Factors  rest  [facility] Adult PT Treatment/Exercise - 11/07/17 0001      Electrical Stimulation   Electrical Stimulation Location  R shoulder  IFC x 15 mins 80-150hz      Electrical Stimulation Goals  Tone;Pain      Vasopneumatic   Number Minutes Vasopneumatic   15 minutes    Vasopnuematic Location   Shoulder    Vasopneumatic Pressure  Medium      Manual Therapy   Manual Therapy  Passive ROM    Passive ROM  PROM to RT shldr  flexion and ER and IR to abdomen                  PT Long Term Goals - 11/07/17 1105      PT LONG TERM GOAL #1   Title  Pt will be independent in his HEP and progression    Time  8    Period  Weeks    Status  On-going      PT LONG TERM GOAL #2   Title  Pt will be able to increase his R shoulder flexion to >/= 150 degrees actively in order to improve functional mobility.     Time  8    Period  Weeks    Status  On-going      PT LONG TERM GOAL #3   Title  Pt will be able to  increase his R ER to >/= 65 degrees in order to improve functional mobilty.     Time  8    Period  Weeks    Status  On-going      PT LONG TERM GOAL #4   Title  pt will be able to report pain of </= 2/10 with ADL's.     Time  8    Period  Weeks    Status  On-going            Plan - 11/07/17 1106    Clinical Impression Statement  Patient tolerated treatment well today. Today focused treatment on PROM for right shoulder per RCR protocol. Patient reported no increased discomfort. Patient reported difficulty sleeping and pain after movement of shoulder when asleep. Current goals ongoing.     Rehab Potential  Good    Clinical Impairments Affecting Rehab Potential  surgery 10/21/17 current 3 weeks 11/11/17    PT Frequency  2x / week    PT Treatment/Interventions  Moist Heat;Functional mobility training;Neuromuscular re-education;Therapeutic exercise;Therapeutic activities;Patient/family education;Manual techniques;Scar mobilization;Manual lymph drainage;Taping;ADLs/Self Care Home Management;Electrical Stimulation;Passive range of motion    PT Next Visit Plan  Follow MD protocol under media PROM only 4 weeks then AAROM per MD    Consulted and Agree with Plan of Care  Patient       Patient will benefit from skilled therapeutic intervention in order to improve the following deficits and impairments:  Decreased range of motion, Pain, Impaired UE functional use, Postural dysfunction  Visit Diagnosis: Acute pain of right shoulder  Stiffness of right shoulder, not elsewhere classified     Problem List Patient Active Problem List   Diagnosis Date Noted  . Hydrocele sac 01/06/2016  . Varicocele 01/06/2016  . Erectile dysfunction of organic origin 12/20/2015  . BPH with obstruction/lower urinary tract symptoms 12/20/2015  . History of hypogonadism 12/20/2015  . History of nephrolithiasis 12/20/2015  . Neuropathy 11/20/2015  . GERD (gastroesophageal reflux disease)   . Chronic back  pain   . Hypertension   . Allergic rhinitis 07/25/2013  . Insomnia 07/25/2013  .  Diabetes ([place]) 03/16/2013  . Hyperlipidemia with target LDL less than 100 03/16/2013  . Depression 03/16/2013  . Lumbar stenosis with neurogenic claudication 12/26/2012    Phillips Climes, PTA 11/07/2017, 11:21 AM  [facility] [place], [place], [address] Phone: [phone]   Fax:  [phone]  Name: JAMUS LOVING MRN: [id number] Date of Birth: October 30, 1961

## 2017-11-09 ENCOUNTER — Encounter: Payer: Self-pay | Admitting: Physical Therapy

## 2017-11-09 ENCOUNTER — Ambulatory Visit: Payer: PPO | Admitting: Physical Therapy

## 2017-11-09 DIAGNOSIS — M25611 Stiffness of right shoulder, not elsewhere classified: Secondary | ICD-10-CM

## 2017-11-09 DIAGNOSIS — M25511 Pain in right shoulder: Secondary | ICD-10-CM | POA: Diagnosis not present

## 2017-11-09 NOTE — Therapy (Signed)
[facility] [place], [place], [address] Phone: [phone]   Fax:  [phone]  Physical Therapy Treatment  Patient Details  Name: [name] MRN: [id number] Date of Birth: [birth year] Referring Provider: Elsie Saas, MD   Encounter Date: 11/09/2017  PT End of Session - 11/09/17 1054    Visit Number  4    Number of Visits  16    Date for PT Re-Evaluation  12/28/17    Authorization Type  g -code every 10th visit    PT Start Time  1029    PT Stop Time  1109    PT Time Calculation (min)  40 min    Activity Tolerance  Patient tolerated treatment well    Behavior During Therapy  [facility] for tasks assessed/performed       Past Medical History:  Diagnosis Date  . Arthritis   . Chronic back pain    stenosis  . Constipation    with pain meds  . Depression    takes Zoloft daily  . Diabetes mellitus without complication ([place])    takes Metformni daily  . Dizziness   . GERD (gastroesophageal reflux disease)    takes Nexium daily  . Headache(784.0)    occasionally  . Hyperlipidemia    takes Zetia daily  . Hypertension    takes Benicar daily  . Impingement syndrome of left shoulder   . Insomnia    takes Ambien nightly  . Joint pain   . Left rotator cuff tear   . Pneumonia 1986   walking  . Seasonal allergies    takes Claritin daily  . Tear of medial meniscus of right knee   . Urinary frequency    takes Flomax daily  . Urinary urgency     Past Surgical History:  Procedure Laterality Date  . BACK SURGERY  [id number]   lumb fusion  . COLONOSCOPY    . KNEE ARTHROSCOPY WITH MEDIAL MENISECTOMY Right 09/26/2013   Procedure: RIGHT KNEE ARTHROSCOPY WITH PARTIAL MEDIAL and lateral chrondroplasty MENISECTOMY;  Surgeon: Lorn Junes, MD;  Location: [place] [facility];  Service: Orthopedics;  Laterality: Right;  . left elbow surgery    . LITHOTRIPSY    . NASAL SEPTUM SURGERY    . removal of kidney stones   1981   lt open removal  . SHOULDER ARTHROSCOPY WITH ROTATOR CUFF REPAIR AND SUBACROMIAL DECOMPRESSION Left 09/26/2013   Procedure: LEFT SHOULDER ARTHROSCOPY WITH DEBRIDEMENT, PARTIAL ROTATOR CUFF REPAIR AND SUBACROMIAL DECOMPRESSION AND SAD ACD DISTAL CLAVICULECTOMY;  Surgeon: Lorn Junes, MD;  Location: [place];  Service: Orthopedics;  Laterality: Left;  . TONSILLECTOMY      There were no vitals filed for this visit.  Subjective Assessment - 11/09/17 1032    Subjective  Patient reported increased sorness for unknown reason. Patient attempted to sleep in bed then had to go back to recliner. Patient repoted not fuly wearing sling to bed, he has sling on but not around his neck.    Limitations  House hold activities;Lifting    Patient Stated Goals  Use my arm again    Currently in Pain?  Yes    Pain Score  4     Pain Location  Shoulder    Pain Orientation  Right    Pain Descriptors / Indicators  Aching;Discomfort    Pain Type  Surgical pain    Pain Onset  1 to 4 weeks ago    Pain Frequency  Intermittent    Aggravating Factors   certain movements    Pain Relieving Factors  rest                      [facility] Adult PT Treatment/Exercise - 11/09/17 0001      Electrical Stimulation   Electrical Stimulation Location  R shoulder  IFC x 15 mins 1-10hz      Electrical Stimulation Goals  Tone;Pain      Vasopneumatic   Number Minutes Vasopneumatic   15 minutes    Vasopnuematic Location   Shoulder    Vasopneumatic Pressure  Low      Manual Therapy   Manual Therapy  Passive ROM    Passive ROM  gentle PROM to RT shldr  flexion and ER and IR to abdomen per protocol limitations                   PT Long Term Goals - 11/07/17 1105      PT LONG TERM GOAL #1   Title  Pt will be independent in his HEP and progression    Time  8    Period  Weeks    Status  On-going      PT LONG TERM GOAL #2   Title  Pt will be able to increase his R shoulder  flexion to >/= 150 degrees actively in order to improve functional mobility.     Time  8    Period  Weeks    Status  On-going      PT LONG TERM GOAL #3   Title  Pt will be able to increase his R ER to >/= 65 degrees in order to improve functional mobilty.     Time  8    Period  Weeks    Status  On-going      PT LONG TERM GOAL #4   Title  pt will be able to report pain of </= 2/10 with ADL's.     Time  8    Period  Weeks    Status  On-going            Plan - 11/09/17 1055    Clinical Impression Statement  Patient tolerated treatment well today. Today focused on PROM only per MD orders. Patient required educational cues to relax due ti increased guarding in shoulder. Patient continues to report ongoing discomfort and difficulty sleeping. Patient not fully wearing sling on nexk when sleeping. Patient sleeps in bed and recliner. Educated patient on protocol and wearing sling until MD DC's sling to avoid pain and re-injury/to allow healing. Goals ongoing at this time.     Rehab Potential  Good    Clinical Impairments Affecting Rehab Potential  surgery 10/21/17 current 3 weeks 11/11/17    PT Frequency  2x / week    PT Treatment/Interventions  Moist Heat;Functional mobility training;Neuromuscular re-education;Therapeutic exercise;Therapeutic activities;Patient/family education;Manual techniques;Scar mobilization;Manual lymph drainage;Taping;ADLs/Self Care Home Management;Electrical Stimulation;Passive range of motion    PT Next Visit Plan  Follow MD protocol under media PROM only 4 weeks then AAROM per MD    Consulted and Agree with Plan of Care  Patient       Patient will benefit from skilled therapeutic intervention in order to improve the following deficits and impairments:  Decreased range of motion, Pain, Impaired UE functional use, Postural dysfunction  Visit Diagnosis: Acute pain of right shoulder  Stiffness of right shoulder, not elsewhere classified     Problem  List Patient Active Problem List   Diagnosis Date Noted  . Hydrocele sac 01/06/2016  . Varicocele 01/06/2016  . Erectile dysfunction of organic origin 12/20/2015  . BPH with obstruction/lower urinary tract symptoms 12/20/2015  . History of hypogonadism 12/20/2015  . History of nephrolithiasis 12/20/2015  . Neuropathy 11/20/2015  . GERD (gastroesophageal reflux disease)   . Chronic back pain   . Hypertension   . Allergic rhinitis 07/25/2013  . Insomnia 07/25/2013  . Diabetes ([place]) 03/16/2013  . Hyperlipidemia with target LDL less than 100 03/16/2013  . Depression 03/16/2013  . Lumbar stenosis with neurogenic claudication 12/26/2012    Phillips Climes, PTA 11/09/2017, 11:14 AM  [facility] [place], [place], [address] Phone: [phone]   Fax:  [phone]  Name: VERSIE FLEENER MRN: [id number] Date of Birth: 20-Dec-1960

## 2017-11-14 ENCOUNTER — Ambulatory Visit: Payer: PPO | Attending: Orthopedic Surgery | Admitting: Physical Therapy

## 2017-11-14 ENCOUNTER — Encounter: Payer: Self-pay | Admitting: Physical Therapy

## 2017-11-14 DIAGNOSIS — R2689 Other abnormalities of gait and mobility: Secondary | ICD-10-CM | POA: Insufficient documentation

## 2017-11-14 DIAGNOSIS — M6281 Muscle weakness (generalized): Secondary | ICD-10-CM | POA: Insufficient documentation

## 2017-11-14 DIAGNOSIS — M791 Myalgia, unspecified site: Secondary | ICD-10-CM | POA: Insufficient documentation

## 2017-11-14 DIAGNOSIS — M25611 Stiffness of right shoulder, not elsewhere classified: Secondary | ICD-10-CM | POA: Insufficient documentation

## 2017-11-14 DIAGNOSIS — M25511 Pain in right shoulder: Secondary | ICD-10-CM | POA: Diagnosis not present

## 2017-11-14 NOTE — Therapy (Signed)
[facility] [place], [place], [address] Phone: [phone]   Fax:  [phone]  Physical Therapy Treatment  Patient Details  Name: [name] MRN: [id number] Date of Birth: [birth year] Referring Provider: Elsie Saas, MD   Encounter Date: 11/14/2017  PT End of Session - 11/14/17 1102    Visit Number  5    Number of Visits  16    Date for PT Re-Evaluation  12/28/17    Authorization Type  g -code every 10th visit    PT Start Time  1030    PT Stop Time  1113    PT Time Calculation (min)  43 min    Activity Tolerance  Patient tolerated treatment well    Behavior During Therapy  [facility] for tasks assessed/performed       Past Medical History:  Diagnosis Date  . Arthritis   . Chronic back pain    stenosis  . Constipation    with pain meds  . Depression    takes Zoloft daily  . Diabetes mellitus without complication ([place])    takes Metformni daily  . Dizziness   . GERD (gastroesophageal reflux disease)    takes Nexium daily  . Headache(784.0)    occasionally  . Hyperlipidemia    takes Zetia daily  . Hypertension    takes Benicar daily  . Impingement syndrome of left shoulder   . Insomnia    takes Ambien nightly  . Joint pain   . Left rotator cuff tear   . Pneumonia 1986   walking  . Seasonal allergies    takes Claritin daily  . Tear of medial meniscus of right knee   . Urinary frequency    takes Flomax daily  . Urinary urgency     Past Surgical History:  Procedure Laterality Date  . BACK SURGERY  [id number]   lumb fusion  . COLONOSCOPY    . KNEE ARTHROSCOPY WITH MEDIAL MENISECTOMY Right 09/26/2013   Procedure: RIGHT KNEE ARTHROSCOPY WITH PARTIAL MEDIAL and lateral chrondroplasty MENISECTOMY;  Surgeon: Lorn Junes, MD;  Location: [place];  Service: Orthopedics;  Laterality: Right;  . left elbow surgery    . LITHOTRIPSY    . NASAL SEPTUM SURGERY    . removal of kidney stones   1981   lt open removal  . SHOULDER ARTHROSCOPY WITH ROTATOR CUFF REPAIR AND SUBACROMIAL DECOMPRESSION Left 09/26/2013   Procedure: LEFT SHOULDER ARTHROSCOPY WITH DEBRIDEMENT, PARTIAL ROTATOR CUFF REPAIR AND SUBACROMIAL DECOMPRESSION AND SAD ACD DISTAL CLAVICULECTOMY;  Surgeon: Lorn Junes, MD;  Location: [place];  Service: Orthopedics;  Laterality: Left;  . TONSILLECTOMY      There were no vitals filed for this visit.  Subjective Assessment - 11/14/17 1035    Subjective  Patient reported some increased pain over weekend due to shopping at mall and several people bumped into his right shoulder    Limitations  House hold activities;Lifting    Patient Stated Goals  Use my arm again    Currently in Pain?  Yes    Pain Score  2     Pain Location  Shoulder    Pain Orientation  Right    Pain Descriptors / Indicators  Discomfort;Sore    Pain Onset  1 to 4 weeks ago    Pain Frequency  Intermittent    Aggravating Factors   certain movements    Pain Relieving Factors  rest  [facility] Adult PT Treatment/Exercise - 11/14/17 0001      Electrical Stimulation   Electrical Stimulation Location  R shoulder  IFC x 15 mins 1-10hz      Electrical Stimulation Goals  Tone;Pain      Vasopneumatic   Number Minutes Vasopneumatic   15 minutes    Vasopnuematic Location   Shoulder    Vasopneumatic Pressure  Low      Manual Therapy   Manual Therapy  Passive ROM    Passive ROM  gentle PROM to RT shldr  flexion and ER and IR to abdomen per protocol limitations                   PT Long Term Goals - 11/07/17 1105      PT LONG TERM GOAL #1   Title  Pt will be independent in his HEP and progression    Time  8    Period  Weeks    Status  On-going      PT LONG TERM GOAL #2   Title  Pt will be able to increase his R shoulder flexion to >/= 150 degrees actively in order to improve functional mobility.     Time  8    Period  Weeks    Status   On-going      PT LONG TERM GOAL #3   Title  Pt will be able to increase his R ER to >/= 65 degrees in order to improve functional mobilty.     Time  8    Period  Weeks    Status  On-going      PT LONG TERM GOAL #4   Title  pt will be able to report pain of </= 2/10 with ADL's.     Time  8    Period  Weeks    Status  On-going            Plan - 11/14/17 1104    Clinical Impression Statement  Patient tolerated treatment well today. Patient reported no increased during treatment. Patient continues to progress with PROM per MD protocol. Goals progressing.    Rehab Potential  Good    Clinical Impairments Affecting Rehab Potential  surgery 10/21/17 current 3 weeks 11/11/17    PT Frequency  2x / week    PT Treatment/Interventions  Moist Heat;Functional mobility training;Neuromuscular re-education;Therapeutic exercise;Therapeutic activities;Patient/family education;Manual techniques;Scar mobilization;Manual lymph drainage;Taping;ADLs/Self Care Home Management;Electrical Stimulation;Passive range of motion    PT Next Visit Plan  Follow MD protocol under media PROM only 4 weeks then AAROM per MD    Consulted and Agree with Plan of Care  Patient       Patient will benefit from skilled therapeutic intervention in order to improve the following deficits and impairments:  Decreased range of motion, Pain, Impaired UE functional use, Postural dysfunction  Visit Diagnosis: Acute pain of right shoulder  Stiffness of right shoulder, not elsewhere classified     Problem List Patient Active Problem List   Diagnosis Date Noted  . Hydrocele sac 01/06/2016  . Varicocele 01/06/2016  . Erectile dysfunction of organic origin 12/20/2015  . BPH with obstruction/lower urinary tract symptoms 12/20/2015  . History of hypogonadism 12/20/2015  . History of nephrolithiasis 12/20/2015  . Neuropathy 11/20/2015  . GERD (gastroesophageal reflux disease)   . Chronic back pain   . Hypertension   .  Allergic rhinitis 07/25/2013  . Insomnia 07/25/2013  . Diabetes ([place]) 03/16/2013  . Hyperlipidemia with target LDL less  than 100 03/16/2013  . Depression 03/16/2013  . Lumbar stenosis with neurogenic claudication 12/26/2012    Phillips Climes, PTA 11/14/2017, 11:22 AM  [facility] [place], [place], [address] Phone: [phone]   Fax:  [phone]  Name: [name] MRN: [id number] Date of Birth: Dec 20, 1960

## 2017-11-16 ENCOUNTER — Encounter: Payer: PPO | Admitting: Physical Therapy

## 2017-11-22 ENCOUNTER — Encounter: Payer: PPO | Admitting: Physical Therapy

## 2017-11-23 ENCOUNTER — Encounter: Payer: PPO | Admitting: Physical Therapy

## 2017-11-24 ENCOUNTER — Ambulatory Visit: Payer: PPO | Admitting: Physical Therapy

## 2017-11-24 ENCOUNTER — Encounter: Payer: Self-pay | Admitting: Physical Therapy

## 2017-11-24 DIAGNOSIS — M25611 Stiffness of right shoulder, not elsewhere classified: Secondary | ICD-10-CM

## 2017-11-24 DIAGNOSIS — M25511 Pain in right shoulder: Secondary | ICD-10-CM | POA: Diagnosis not present

## 2017-11-24 DIAGNOSIS — M19011 Primary osteoarthritis, right shoulder: Secondary | ICD-10-CM | POA: Diagnosis not present

## 2017-11-24 NOTE — Therapy (Signed)
[facility] [place], [place], [address] Phone: [phone]   Fax:  [phone]  Physical Therapy Treatment  Patient Details  Name: [name] MRN: [id number] Date of Birth: 1961-12-06 Referring Provider: Elsie Saas, MD   Encounter Date: 11/24/2017  PT End of Session - 11/24/17 1101    Visit Number  6    Number of Visits  16    Date for PT Re-Evaluation  12/28/17    Authorization Type  g -code every 10th visit    PT Start Time  1030    PT Stop Time  1112    PT Time Calculation (min)  42 min    Activity Tolerance  Patient tolerated treatment well    Behavior During Therapy  [facility] for tasks assessed/performed       Past Medical History:  Diagnosis Date  . Arthritis   . Chronic back pain    stenosis  . Constipation    with pain meds  . Depression    takes Zoloft daily  . Diabetes mellitus without complication ([place])    takes Metformni daily  . Dizziness   . GERD (gastroesophageal reflux disease)    takes Nexium daily  . Headache(784.0)    occasionally  . Hyperlipidemia    takes Zetia daily  . Hypertension    takes Benicar daily  . Impingement syndrome of left shoulder   . Insomnia    takes Ambien nightly  . Joint pain   . Left rotator cuff tear   . Pneumonia 1986   walking  . Seasonal allergies    takes Claritin daily  . Tear of medial meniscus of right knee   . Urinary frequency    takes Flomax daily  . Urinary urgency     Past Surgical History:  Procedure Laterality Date  . BACK SURGERY  [id number]   lumb fusion  . COLONOSCOPY    . KNEE ARTHROSCOPY WITH MEDIAL MENISECTOMY Right 09/26/2013   Procedure: RIGHT KNEE ARTHROSCOPY WITH PARTIAL MEDIAL and lateral chrondroplasty MENISECTOMY;  Surgeon: Lorn Junes, MD;  Location: [place];  Service: Orthopedics;  Laterality: Right;  . left elbow surgery    . LITHOTRIPSY    . NASAL SEPTUM SURGERY    . removal of kidney stones   1981   lt open removal  . SHOULDER ARTHROSCOPY WITH ROTATOR CUFF REPAIR AND SUBACROMIAL DECOMPRESSION Left 09/26/2013   Procedure: LEFT SHOULDER ARTHROSCOPY WITH DEBRIDEMENT, PARTIAL ROTATOR CUFF REPAIR AND SUBACROMIAL DECOMPRESSION AND SAD ACD DISTAL CLAVICULECTOMY;  Surgeon: Lorn Junes, MD;  Location: [place];  Service: Orthopedics;  Laterality: Left;  . TONSILLECTOMY      There were no vitals filed for this visit.  Subjective Assessment - 11/24/17 1055    Subjective  Patient reported doing well not wearing a sling the past few days. Patient reported sleeping on right side with no pain. He reported some posterior shoulder discomfort for unknown reason.    Limitations  House hold activities;Lifting    Patient Stated Goals  Use my arm again    Currently in Pain?  Yes    Pain Score  4     Pain Orientation  Right    Pain Descriptors / Indicators  Discomfort    Aggravating Factors   certain movements of right shoulder    Pain Relieving Factors  at rest and medication  [facility] Adult PT Treatment/Exercise - 11/24/17 0001      Electrical Stimulation   Electrical Stimulation Location  R shoulder  IFC x 15 mins 1-10hz      Electrical Stimulation Goals  Tone;Pain      Vasopneumatic   Number Minutes Vasopneumatic   15 minutes    Vasopnuematic Location   Shoulder    Vasopneumatic Pressure  Low      Manual Therapy   Manual Therapy  Passive ROM    Passive ROM  gentle PROM to RT shldr  flexion and ER and IR to abdomen per protocol limitations                   PT Long Term Goals - 11/07/17 1105      PT LONG TERM GOAL #1   Title  Pt will be independent in his HEP and progression    Time  8    Period  Weeks    Status  On-going      PT LONG TERM GOAL #2   Title  Pt will be able to increase his R shoulder flexion to >/= 150 degrees actively in order to improve functional mobility.     Time  8    Period  Weeks    Status   On-going      PT LONG TERM GOAL #3   Title  Pt will be able to increase his R ER to >/= 65 degrees in order to improve functional mobilty.     Time  8    Period  Weeks    Status  On-going      PT LONG TERM GOAL #4   Title  pt will be able to report pain of </= 2/10 with ADL's.     Time  8    Period  Weeks    Status  On-going            Plan - 11/24/17 1102    Clinical Impression Statement  Patient tolerated treatment well today. Patient progressing overall per protocol. Patient was educated on protocol limitations and progression. Patient has improved with PROM for all motions within protocol limitations. Patient has little discomfort overall reported. Patient has been out of a sling for a few days. Patient will be 5 weeks post op tomorrow and will progress AAROM per MD protocol/order. MD this afternoon for follow up and MD note sent. Goals ongoing at this time.     Rehab Potential  Good    Clinical Impairments Affecting Rehab Potential  surgery 10/21/17 current 5 weeks 11/15/17    PT Frequency  2x / week    PT Treatment/Interventions  Moist Heat;Functional mobility training;Neuromuscular re-education;Therapeutic exercise;Therapeutic activities;Patient/family education;Manual techniques;Scar mobilization;Manual lymph drainage;Taping;ADLs/Self Care Home Management;Electrical Stimulation;Passive range of motion    PT Next Visit Plan  Follow MD protocol under media PROM only 4 weeks then AAROM per MD (MD note sent today) cont per MD discression    Consulted and Agree with Plan of Care  Patient       Patient will benefit from skilled therapeutic intervention in order to improve the following deficits and impairments:  Decreased range of motion, Pain, Impaired UE functional use, Postural dysfunction  Visit Diagnosis: Acute pain of right shoulder  Stiffness of right shoulder, not elsewhere classified     Problem List Patient Active Problem List   Diagnosis Date Noted  . Hydrocele  sac 01/06/2016  . Varicocele 01/06/2016  . Erectile dysfunction of organic origin 12/20/2015  .  BPH with obstruction/lower urinary tract symptoms 12/20/2015  . History of hypogonadism 12/20/2015  . History of nephrolithiasis 12/20/2015  . Neuropathy 11/20/2015  . GERD (gastroesophageal reflux disease)   . Chronic back pain   . Hypertension   . Allergic rhinitis 07/25/2013  . Insomnia 07/25/2013  . Diabetes ([place]) 03/16/2013  . Hyperlipidemia with target LDL less than 100 03/16/2013  . Depression 03/16/2013  . Lumbar stenosis with neurogenic claudication 12/26/2012    Ladean Raya, PTA 11/24/17 11:12 AM  [facility] [place], [place], [address] Phone: [phone]   Fax:  [phone]  Name: [name] MRN: [id number] Date of Birth: 1961/10/03

## 2017-11-25 ENCOUNTER — Ambulatory Visit: Payer: PPO | Admitting: Physical Therapy

## 2017-11-25 DIAGNOSIS — M25611 Stiffness of right shoulder, not elsewhere classified: Secondary | ICD-10-CM

## 2017-11-25 DIAGNOSIS — M25511 Pain in right shoulder: Secondary | ICD-10-CM | POA: Diagnosis not present

## 2017-11-25 NOTE — Therapy (Signed)
[facility] [place], [place], [address] Phone: [phone]   Fax:  [phone]  Physical Therapy Treatment  Patient Details  Name: [name] MRN: [id number] Date of Birth: November 26, 1961 Referring Provider: Elsie Saas, MD   Encounter Date: 11/25/2017  PT End of Session - 11/25/17 1121    Visit Number  7    Number of Visits  16    Date for PT Re-Evaluation  12/28/17    Authorization Type  g -code every 10th visit    PT Start Time  1115    PT Stop Time  1212    PT Time Calculation (min)  57 min    Activity Tolerance  Patient tolerated treatment well    Behavior During Therapy  [facility] for tasks assessed/performed       Past Medical History:  Diagnosis Date  . Arthritis   . Chronic back pain    stenosis  . Constipation    with pain meds  . Depression    takes Zoloft daily  . Diabetes mellitus without complication ([place])    takes Metformni daily  . Dizziness   . GERD (gastroesophageal reflux disease)    takes Nexium daily  . Headache(784.0)    occasionally  . Hyperlipidemia    takes Zetia daily  . Hypertension    takes Benicar daily  . Impingement syndrome of left shoulder   . Insomnia    takes Ambien nightly  . Joint pain   . Left rotator cuff tear   . Pneumonia 1986   walking  . Seasonal allergies    takes Claritin daily  . Tear of medial meniscus of right knee   . Urinary frequency    takes Flomax daily  . Urinary urgency     Past Surgical History:  Procedure Laterality Date  . BACK SURGERY  [id number]   lumb fusion  . COLONOSCOPY    . KNEE ARTHROSCOPY WITH MEDIAL MENISECTOMY Right 09/26/2013   Procedure: RIGHT KNEE ARTHROSCOPY WITH PARTIAL MEDIAL and lateral chrondroplasty MENISECTOMY;  Surgeon: Lorn Junes, MD;  Location: [place];  Service: Orthopedics;  Laterality: Right;  . left elbow surgery    . LITHOTRIPSY    . NASAL SEPTUM SURGERY    . removal of kidney stones   1981   lt open removal  . SHOULDER ARTHROSCOPY WITH ROTATOR CUFF REPAIR AND SUBACROMIAL DECOMPRESSION Left 09/26/2013   Procedure: LEFT SHOULDER ARTHROSCOPY WITH DEBRIDEMENT, PARTIAL ROTATOR CUFF REPAIR AND SUBACROMIAL DECOMPRESSION AND SAD ACD DISTAL CLAVICULECTOMY;  Surgeon: Lorn Junes, MD;  Location: [place];  Service: Orthopedics;  Laterality: Left;  . TONSILLECTOMY      There were no vitals filed for this visit.  Subjective Assessment - 11/25/17 1122    Subjective  Patient went to the MD yesterday (11/24/17) and was given a new order to begin strengtening per protocol. The shoulder hurts more today after the PA moved his shoulder into full flexion.    Limitations  House hold activities;Lifting    Patient Stated Goals  Use my arm again    Currently in Pain?  Yes    Pain Score  4     Pain Location  Shoulder    Pain Orientation  Right    Pain Descriptors / Indicators  Discomfort    Pain Type  Surgical pain    Pain Onset  More than a month ago    Pain Frequency  Intermittent  [facility] Adult PT Treatment/Exercise - 11/25/17 0001      Shoulder Exercises: Supine   External Rotation  AAROM;20 reps cane    Flexion  AAROM;Both;20 reps    Other Supine Exercises  chest press x 10      Shoulder Exercises: Prone   Extension  Strengthening;Right;20 reps to hip      Shoulder Exercises: Pulleys   Flexion  Other (comment) 5 min    Other Pulley Exercises  UE ranger x 20 flexion      Shoulder Exercises: Isometric Strengthening   Extension  5X10"    Internal Rotation  5X10"      Modalities   Modalities  Electrical Stimulation;Cryotherapy      Cryotherapy   Number Minutes Cryotherapy  15 Minutes    Cryotherapy Location  Shoulder    Type of Cryotherapy  Ice pack      Electrical Stimulation   Electrical Stimulation Location  R shoulder  IFC x 15 mins 1-10hz      Electrical Stimulation Goals  Pain      Manual Therapy   Manual  Therapy  Passive ROM    Passive ROM  into flexion no limit and ER to 45 deg             PT Education - 11/25/17 1206    Education provided  Yes    Education Details  HEP    Person(s) Educated  Patient    Methods  Explanation;Demonstration;Handout    Comprehension  Verbalized understanding;Returned demonstration          PT Long Term Goals - 11/07/17 1105      PT LONG TERM GOAL #1   Title  Pt will be independent in his HEP and progression    Time  8    Period  Weeks    Status  On-going      PT LONG TERM GOAL #2   Title  Pt will be able to increase his R shoulder flexion to >/= 150 degrees actively in order to improve functional mobility.     Time  8    Period  Weeks    Status  On-going      PT LONG TERM GOAL #3   Title  Pt will be able to increase his R ER to >/= 65 degrees in order to improve functional mobilty.     Time  8    Period  Weeks    Status  On-going      PT LONG TERM GOAL #4   Title  pt will be able to report pain of </= 2/10 with ADL's.     Time  8    Period  Weeks    Status  On-going            Plan - 11/25/17 1207    Clinical Impression Statement  Patient is doing very well with ROM and tolerated AAROM and strengthening well today.    PT Treatment/Interventions  Moist Heat;Functional mobility training;Neuromuscular re-education;Therapeutic exercise;Therapeutic activities;Patient/family education;Manual techniques;Scar mobilization;Manual lymph drainage;Taping;ADLs/Self Care Home Management;Electrical Stimulation;Passive range of motion    PT Next Visit Plan  Follow MD protocol under media PROM only 4 weeks then [place] /strengthening per protocol (New order received 11/25/17)       Patient will benefit from skilled therapeutic intervention in order to improve the following deficits and impairments:  Decreased range of motion, Pain, Impaired UE functional use, Postural dysfunction  Visit Diagnosis: Acute pain of right shoulder  Stiffness  of right shoulder, not elsewhere classified     Problem List Patient Active Problem List   Diagnosis Date Noted  . Hydrocele sac 01/06/2016  . Varicocele 01/06/2016  . Erectile dysfunction of organic origin 12/20/2015  . BPH with obstruction/lower urinary tract symptoms 12/20/2015  . History of hypogonadism 12/20/2015  . History of nephrolithiasis 12/20/2015  . Neuropathy 11/20/2015  . GERD (gastroesophageal reflux disease)   . Chronic back pain   . Hypertension   . Allergic rhinitis 07/25/2013  . Insomnia 07/25/2013  . Diabetes ([place]) 03/16/2013  . Hyperlipidemia with target LDL less than 100 03/16/2013  . Depression 03/16/2013  . Lumbar stenosis with neurogenic claudication 12/26/2012    Madelyn Flavors PT 11/25/2017, 12:11 PM  [facility] [address] [place], [place], [address] Phone: [phone]   Fax:  [phone]  Name: LORETTA KLUENDER MRN: [id number] Date of Birth: Aug 01, 1961

## 2017-11-25 NOTE — Patient Instructions (Signed)
SHOULDER: External Rotation - Supine (Cane)   Hold cane with both hands. Rotate arm away from body. Keep elbow on floor and next to body. __20_ reps per set, __2_ sets per day, _5__ days per week Add towel to keep elbow at side.  Cane Exercise: Flexion   Lie on back, holding cane above chest. Keeping arms as straight as possible, lower cane toward floor beyond head. Hold _2-3___ seconds. Repeat _20___ times. Do _2___ sessions per day.  http://gt2.exer.us/91   Copyright  VHI. All rights reserved.   Extension - Prone NO WEIGHT    Lie with right arm hanging off side of bed. Lift hand back and up to hip level. Repeat _10___ times per set. Do _3___ sets per session. Do _3___ sessions per week. Use __0_ lb weight.   Copyright  VHI. All rights reserved.   Madelyn Flavors, PT 11/25/17 12:06 PM [facility] [address] [place], [place], [address] Phone: [phone]   Fax:  [phone]

## 2017-11-28 ENCOUNTER — Ambulatory Visit: Payer: PPO | Admitting: Physical Therapy

## 2017-11-28 ENCOUNTER — Encounter: Payer: Self-pay | Admitting: Physical Therapy

## 2017-11-28 DIAGNOSIS — M25611 Stiffness of right shoulder, not elsewhere classified: Secondary | ICD-10-CM

## 2017-11-28 DIAGNOSIS — M25511 Pain in right shoulder: Secondary | ICD-10-CM | POA: Diagnosis not present

## 2017-11-28 NOTE — Therapy (Signed)
[facility] [place], [place], [address] Phone: [phone]   Fax:  [phone]  Physical Therapy Treatment  Patient Details  Name: [name] MRN: [id number] Date of Birth: 1961-11-03 Referring Provider: Elsie Saas, MD   Encounter Date: 11/28/2017  PT End of Session - 11/28/17 1136    Visit Number  8    Number of Visits  16    Date for PT Re-Evaluation  12/28/17    Authorization Type  g -code every 10th visit    PT Start Time  1116    PT Stop Time  1203    PT Time Calculation (min)  47 min    Activity Tolerance  Patient tolerated treatment well    Behavior During Therapy  [facility] for tasks assessed/performed       Past Medical History:  Diagnosis Date  . Arthritis   . Chronic back pain    stenosis  . Constipation    with pain meds  . Depression    takes Zoloft daily  . Diabetes mellitus without complication ([place])    takes Metformni daily  . Dizziness   . GERD (gastroesophageal reflux disease)    takes Nexium daily  . Headache(784.0)    occasionally  . Hyperlipidemia    takes Zetia daily  . Hypertension    takes Benicar daily  . Impingement syndrome of left shoulder   . Insomnia    takes Ambien nightly  . Joint pain   . Left rotator cuff tear   . Pneumonia 1986   walking  . Seasonal allergies    takes Claritin daily  . Tear of medial meniscus of right knee   . Urinary frequency    takes Flomax daily  . Urinary urgency     Past Surgical History:  Procedure Laterality Date  . BACK SURGERY  [id number]   lumb fusion  . COLONOSCOPY    . KNEE ARTHROSCOPY WITH MEDIAL MENISECTOMY Right 09/26/2013   Procedure: RIGHT KNEE ARTHROSCOPY WITH PARTIAL MEDIAL and lateral chrondroplasty MENISECTOMY;  Surgeon: Lorn Junes, MD;  Location: [place];  Service: Orthopedics;  Laterality: Right;  . left elbow surgery    . LITHOTRIPSY    . NASAL SEPTUM SURGERY    . removal of kidney stones   1981   lt open removal  . SHOULDER ARTHROSCOPY WITH ROTATOR CUFF REPAIR AND SUBACROMIAL DECOMPRESSION Left 09/26/2013   Procedure: LEFT SHOULDER ARTHROSCOPY WITH DEBRIDEMENT, PARTIAL ROTATOR CUFF REPAIR AND SUBACROMIAL DECOMPRESSION AND SAD ACD DISTAL CLAVICULECTOMY;  Surgeon: Lorn Junes, MD;  Location: [place];  Service: Orthopedics;  Laterality: Left;  . TONSILLECTOMY      There were no vitals filed for this visit.  Subjective Assessment - 11/28/17 1118    Subjective  Patient did well after last treatment with some discomfort per reported     Limitations  House hold activities;Lifting    Patient Stated Goals  Use my arm again    Pain Score  4     Pain Location  Shoulder    Pain Orientation  Right    Pain Descriptors / Indicators  Discomfort    Pain Type  Surgical pain    Pain Onset  More than a month ago    Pain Frequency  Intermittent    Aggravating Factors   increased activity with shoulder    Pain Relieving Factors  rest and meds  [facility] Adult PT Treatment/Exercise - 11/28/17 0001      Shoulder Exercises: Supine   External Rotation  AAROM;20 reps cane    Flexion  AAROM;Both;20 reps cane    Other Supine Exercises  chest press with cane 2x10      Shoulder Exercises: Pulleys   Flexion  Other (comment) 24min    Other Pulley Exercises  UE ranger for flexion and circles 2x10 each    Other Pulley Exercises  wall walk x10      Electrical Stimulation   Electrical Stimulation Location  R shoulder  IFC x 15 mins 1-10hz      Electrical Stimulation Goals  Pain      Vasopneumatic   Number Minutes Vasopneumatic   15 minutes    Vasopnuematic Location   Shoulder    Vasopneumatic Pressure  Low      Manual Therapy   Manual Therapy  Passive ROM    Passive ROM  into flexion no limit and ER to 45 deg                  PT Long Term Goals - 11/07/17 1105      PT LONG TERM GOAL #1   Title  Pt will be independent in his  HEP and progression    Time  8    Period  Weeks    Status  On-going      PT LONG TERM GOAL #2   Title  Pt will be able to increase his R shoulder flexion to >/= 150 degrees actively in order to improve functional mobility.     Time  8    Period  Weeks    Status  On-going      PT LONG TERM GOAL #3   Title  Pt will be able to increase his R ER to >/= 65 degrees in order to improve functional mobilty.     Time  8    Period  Weeks    Status  On-going      PT LONG TERM GOAL #4   Title  pt will be able to report pain of </= 2/10 with ADL's.     Time  8    Period  Weeks    Status  On-going            Plan - 11/28/17 1136    Clinical Impression Statement  Patient tolerated treatment well today. Patient progressing with AAROM activities per protocol. Patient reported no increased discomfort. Goals progresiing yet ongoing.     Rehab Potential  Good    Clinical Impairments Affecting Rehab Potential  surgery 10/21/17 current 5 weeks 11/25/17    PT Frequency  2x / week    PT Treatment/Interventions  Moist Heat;Functional mobility training;Neuromuscular re-education;Therapeutic exercise;Therapeutic activities;Patient/family education;Manual techniques;Scar mobilization;Manual lymph drainage;Taping;ADLs/Self Care Home Management;Electrical Stimulation;Passive range of motion    PT Next Visit Plan  Follow MD protocol under media for AAROM /strengthening per protocol     Consulted and Agree with Plan of Care  Patient       Patient will benefit from skilled therapeutic intervention in order to improve the following deficits and impairments:  Decreased range of motion, Pain, Impaired UE functional use, Postural dysfunction  Visit Diagnosis: Acute pain of right shoulder  Stiffness of right shoulder, not elsewhere classified     Problem List Patient Active Problem List   Diagnosis Date Noted  . Hydrocele sac 01/06/2016  . Varicocele 01/06/2016  . Erectile dysfunction of organic  origin 12/20/2015  . BPH with obstruction/lower urinary tract symptoms 12/20/2015  . History of hypogonadism 12/20/2015  . History of nephrolithiasis 12/20/2015  . Neuropathy 11/20/2015  . GERD (gastroesophageal reflux disease)   . Chronic back pain   . Hypertension   . Allergic rhinitis 07/25/2013  . Insomnia 07/25/2013  . Diabetes ([place]) 03/16/2013  . Hyperlipidemia with target LDL less than 100 03/16/2013  . Depression 03/16/2013  . Lumbar stenosis with neurogenic claudication 12/26/2012    Phillips Climes, PTA 11/28/2017, 12:03 PM  [facility] [place], [place], [address] Phone: [phone]   Fax:  [phone]  Name: BROADY LAFOY MRN: [id number] Date of Birth: [birth year]

## 2017-11-30 ENCOUNTER — Encounter: Payer: PPO | Admitting: Physical Therapy

## 2017-12-02 ENCOUNTER — Ambulatory Visit: Payer: PPO | Admitting: *Deleted

## 2017-12-02 DIAGNOSIS — M791 Myalgia, unspecified site: Secondary | ICD-10-CM

## 2017-12-02 DIAGNOSIS — M25511 Pain in right shoulder: Secondary | ICD-10-CM | POA: Diagnosis not present

## 2017-12-02 DIAGNOSIS — M25611 Stiffness of right shoulder, not elsewhere classified: Secondary | ICD-10-CM

## 2017-12-02 DIAGNOSIS — R2689 Other abnormalities of gait and mobility: Secondary | ICD-10-CM

## 2017-12-02 DIAGNOSIS — M6281 Muscle weakness (generalized): Secondary | ICD-10-CM

## 2017-12-02 NOTE — Therapy (Signed)
[facility] [place], [place], [address] Phone: [phone]   Fax:  [phone]  Physical Therapy Treatment  Patient Details  Name: [name] MRN: [id number] Date of Birth: [birth year] Referring Provider: Elsie Saas, MD   Encounter Date: 12/02/2017  PT End of Session - 12/02/17 1227    Visit Number  9    Number of Visits  16    Date for PT Re-Evaluation  12/28/17    Authorization Type  g -code every 10th visit    PT Start Time  1115    PT Stop Time  1214    PT Time Calculation (min)  59 min       Past Medical History:  Diagnosis Date  . Arthritis   . Chronic back pain    stenosis  . Constipation    with pain meds  . Depression    takes Zoloft daily  . Diabetes mellitus without complication ([place])    takes Metformni daily  . Dizziness   . GERD (gastroesophageal reflux disease)    takes Nexium daily  . Headache(784.0)    occasionally  . Hyperlipidemia    takes Zetia daily  . Hypertension    takes Benicar daily  . Impingement syndrome of left shoulder   . Insomnia    takes Ambien nightly  . Joint pain   . Left rotator cuff tear   . Pneumonia 1986   walking  . Seasonal allergies    takes Claritin daily  . Tear of medial meniscus of right knee   . Urinary frequency    takes Flomax daily  . Urinary urgency     Past Surgical History:  Procedure Laterality Date  . BACK SURGERY  [id number]   lumb fusion  . COLONOSCOPY    . KNEE ARTHROSCOPY WITH MEDIAL MENISECTOMY Right 09/26/2013   Procedure: RIGHT KNEE ARTHROSCOPY WITH PARTIAL MEDIAL and lateral chrondroplasty MENISECTOMY;  Surgeon: Lorn Junes, MD;  Location: [facility];  Service: Orthopedics;  Laterality: Right;  . left elbow surgery    . LITHOTRIPSY    . NASAL SEPTUM SURGERY    . removal of kidney stones  1981   lt open removal  . SHOULDER ARTHROSCOPY WITH ROTATOR CUFF REPAIR AND SUBACROMIAL DECOMPRESSION Left 09/26/2013    Procedure: LEFT SHOULDER ARTHROSCOPY WITH DEBRIDEMENT, PARTIAL ROTATOR CUFF REPAIR AND SUBACROMIAL DECOMPRESSION AND SAD ACD DISTAL CLAVICULECTOMY;  Surgeon: Lorn Junes, MD;  Location: [facility];  Service: Orthopedics;  Laterality: Left;  . TONSILLECTOMY      There were no vitals filed for this visit.                   [facility] Adult PT Treatment/Exercise - 12/02/17 0001      Shoulder Exercises: Supine   Other Supine Exercises  chest press with cane 2x10      Shoulder Exercises: Pulleys   Flexion  Other (comment) 58min    Other Pulley Exercises  UE ranger for flexion and circles 2x10 each    Other Pulley Exercises  wall walk x10      Modalities   Modalities  Electrical Stimulation;Cryotherapy      Electrical Stimulation   Electrical Stimulation Location  R shoulder  IFC x 15 mins 1-10hz      Electrical Stimulation Goals  Pain      Vasopneumatic   Number Minutes Vasopneumatic   15 minutes    Vasopnuematic Location   Shoulder  Vasopneumatic Pressure  Low    Vasopneumatic Temperature   36      Manual Therapy   Manual Therapy  Passive ROM    Passive ROM  into flexion Er, IR and ABD                  PT Long Term Goals - 11/07/17 1105      PT LONG TERM GOAL #1   Title  Pt will be independent in his HEP and progression    Time  8    Period  Weeks    Status  On-going      PT LONG TERM GOAL #2   Title  Pt will be able to increase his R shoulder flexion to >/= 150 degrees actively in order to improve functional mobility.     Time  8    Period  Weeks    Status  On-going      PT LONG TERM GOAL #3   Title  Pt will be able to increase his R ER to >/= 65 degrees in order to improve functional mobilty.     Time  8    Period  Weeks    Status  On-going      PT LONG TERM GOAL #4   Title  pt will be able to report pain of </= 2/10 with ADL's.     Time  8    Period  Weeks    Status  On-going            Plan - 12/02/17 1227     Clinical Impression Statement  Pt arrived to clinic doing fairly well with minimal pain in RT shldr. He is 6 weeks post-op today. He did well with AAROM exs and  manual PROM. Flexion to 155 degrees, ER to 70 degrees. Normal modality responnse.    Clinical Presentation  Stable    Clinical Decision Making  Low    Rehab Potential  Good    Clinical Impairments Affecting Rehab Potential  surgery 10/21/17 current 6 weeks 12/02/17       Patient will benefit from skilled therapeutic intervention in order to improve the following deficits and impairments:  Decreased range of motion, Pain, Impaired UE functional use, Postural dysfunction  Visit Diagnosis: Acute pain of right shoulder  Stiffness of right shoulder, not elsewhere classified  Other abnormalities of gait and mobility  Myalgia  Muscle weakness (generalized)     Problem List Patient Active Problem List   Diagnosis Date Noted  . Hydrocele sac 01/06/2016  . Varicocele 01/06/2016  . Erectile dysfunction of organic origin 12/20/2015  . BPH with obstruction/lower urinary tract symptoms 12/20/2015  . History of hypogonadism 12/20/2015  . History of nephrolithiasis 12/20/2015  . Neuropathy 11/20/2015  . GERD (gastroesophageal reflux disease)   . Chronic back pain   . Hypertension   . Allergic rhinitis 07/25/2013  . Insomnia 07/25/2013  . Diabetes ([place]) 03/16/2013  . Hyperlipidemia with target LDL less than 100 03/16/2013  . Depression 03/16/2013  . Lumbar stenosis with neurogenic claudication 12/26/2012    RAMSEUR,CHRIS, PTA 12/02/2017, 12:33 PM  [facility] [place], [place], [address] Phone: [phone]   Fax:  [phone]  Name: [name] MRN: [id number] Date of Birth: 1961-01-17

## 2017-12-08 ENCOUNTER — Encounter: Payer: Self-pay | Admitting: Physical Therapy

## 2017-12-08 ENCOUNTER — Ambulatory Visit: Payer: PPO | Admitting: Physical Therapy

## 2017-12-08 DIAGNOSIS — M25511 Pain in right shoulder: Secondary | ICD-10-CM | POA: Diagnosis not present

## 2017-12-08 DIAGNOSIS — M25611 Stiffness of right shoulder, not elsewhere classified: Secondary | ICD-10-CM

## 2017-12-08 NOTE — Therapy (Signed)
[facility] [place], [place], [address] Phone: [phone]   Fax:  [phone]  Physical Therapy Treatment  Patient Details  Name: [name] MRN: [id number] Date of Birth: Aug 10, 1961 Referring Provider: Elsie Saas, MD   Encounter Date: 12/08/2017  PT End of Session - 12/08/17 1645    Visit Number  10    Number of Visits  16    Date for PT Re-Evaluation  12/28/17    Authorization Type  g -code every 10th visit    PT Start Time  0316    PT Stop Time  0404    PT Time Calculation (min)  48 min    Activity Tolerance  Patient tolerated treatment well    Behavior During Therapy  [facility] for tasks assessed/performed       Past Medical History:  Diagnosis Date  . Arthritis   . Chronic back pain    stenosis  . Constipation    with pain meds  . Depression    takes Zoloft daily  . Diabetes mellitus without complication ([place])    takes Metformni daily  . Dizziness   . GERD (gastroesophageal reflux disease)    takes Nexium daily  . Headache(784.0)    occasionally  . Hyperlipidemia    takes Zetia daily  . Hypertension    takes Benicar daily  . Impingement syndrome of left shoulder   . Insomnia    takes Ambien nightly  . Joint pain   . Left rotator cuff tear   . Pneumonia 1986   walking  . Seasonal allergies    takes Claritin daily  . Tear of medial meniscus of right knee   . Urinary frequency    takes Flomax daily  . Urinary urgency     Past Surgical History:  Procedure Laterality Date  . BACK SURGERY  [id number]   lumb fusion  . COLONOSCOPY    . KNEE ARTHROSCOPY WITH MEDIAL MENISECTOMY Right 09/26/2013   Procedure: RIGHT KNEE ARTHROSCOPY WITH PARTIAL MEDIAL and lateral chrondroplasty MENISECTOMY;  Surgeon: Lorn Junes, MD;  Location: [facility];  Service: Orthopedics;  Laterality: Right;  . left elbow surgery    . LITHOTRIPSY    . NASAL SEPTUM SURGERY    . removal of kidney stones   1981   lt open removal  . SHOULDER ARTHROSCOPY WITH ROTATOR CUFF REPAIR AND SUBACROMIAL DECOMPRESSION Left 09/26/2013   Procedure: LEFT SHOULDER ARTHROSCOPY WITH DEBRIDEMENT, PARTIAL ROTATOR CUFF REPAIR AND SUBACROMIAL DECOMPRESSION AND SAD ACD DISTAL CLAVICULECTOMY;  Surgeon: Lorn Junes, MD;  Location: [facility];  Service: Orthopedics;  Laterality: Left;  . TONSILLECTOMY      There were no vitals filed for this visit.  Subjective Assessment - 12/08/17 1632    Subjective  I lean on my center console on a long drive and it increased my right shoulder pain.    Pain Score  6     Pain Location  Shoulder    Pain Orientation  Right    Pain Descriptors / Indicators  Discomfort    Pain Type  Surgical pain    Pain Onset  More than a month ago                      [facility] Adult PT Treatment/Exercise - 12/08/17 0001      Shoulder Exercises: Pulleys   Other Pulley Exercises  UE Ranger on wall x 5 minutes.  Other Pulley Exercises  Pulleys x 5 minutes.      Shoulder Exercises: ROM/Strengthening   UBE (Upper Arm Bike)  6 minutes with left UE propelling right UE (3 minutes forward and 3 minutes backward).      Modalities   Modalities  Health visitor Stimulation Location  Right shoulder.    Electrical Stimulation Action  IFC     Electrical Stimulation Parameters  80-150 Hz x 20 minutes.    Electrical Stimulation Goals  Pain      Vasopneumatic   Number Minutes Vasopneumatic   20 minutes    Vasopnuematic Location   -- Right shoulder.    Vasopneumatic Pressure  Medium      Manual Therapy   Manual Therapy  Passive ROM    Passive ROM  In supine:  PROM into right shoulder flexion, ER and IR x 7 minutes.                  PT Long Term Goals - 11/07/17 1105      PT LONG TERM GOAL #1   Title  Pt will be independent in his HEP and progression    Time  8    Period  Weeks    Status   On-going      PT LONG TERM GOAL #2   Title  Pt will be able to increase his R shoulder flexion to >/= 150 degrees actively in order to improve functional mobility.     Time  8    Period  Weeks    Status  On-going      PT LONG TERM GOAL #3   Title  Pt will be able to increase his R ER to >/= 65 degrees in order to improve functional mobilty.     Time  8    Period  Weeks    Status  On-going      PT LONG TERM GOAL #4   Title  pt will be able to report pain of </= 2/10 with ADL's.     Time  8    Period  Weeks    Status  On-going            Plan - Jan 06, 2018 1642    Clinical Impression Statement  Excellent job.  Patient essentially with full right shoulder range of motion passively now.    Clinical Presentation  Stable    Clinical Decision Making  Low    PT Treatment/Interventions  Moist Heat;Functional mobility training;Neuromuscular re-education;Therapeutic exercise;Therapeutic activities;Patient/family education;Manual techniques;Scar mobilization;Manual lymph drainage;Taping;ADLs/Self Care Home Management;Electrical Stimulation;Passive range of motion    PT Next Visit Plan  Follow MD protocol under media for AAROM /strengthening per protocol.  Also okay to begin low-level strengthening.    Consulted and Agree with Plan of Care  Patient       Patient will benefit from skilled therapeutic intervention in order to improve the following deficits and impairments:  Decreased range of motion, Pain, Impaired UE functional use, Postural dysfunction  Visit Diagnosis: Acute pain of right shoulder  Stiffness of right shoulder, not elsewhere classified   G-Codes - 2018-01-06 1645    Functional Assessment Tool Used (Outpatient Only)  Clinical judgement..10th visit.    Functional Limitation  Carrying, moving and handling objects    Carrying, Moving and Handling Objects Current Status [phone])  At least 60 percent but less than 80 percent impaired, limited or restricted    Carrying, Moving  and Handling Objects Goal Status [phone])  At least 40 percent but less than 60 percent impaired, limited or restricted       Problem List Patient Active Problem List   Diagnosis Date Noted  . Hydrocele sac 01/06/2016  . Varicocele 01/06/2016  . Erectile dysfunction of organic origin 12/20/2015  . BPH with obstruction/lower urinary tract symptoms 12/20/2015  . History of hypogonadism 12/20/2015  . History of nephrolithiasis 12/20/2015  . Neuropathy 11/20/2015  . GERD (gastroesophageal reflux disease)   . Chronic back pain   . Hypertension   . Allergic rhinitis 07/25/2013  . Insomnia 07/25/2013  . Diabetes ([place]) 03/16/2013  . Hyperlipidemia with target LDL less than 100 03/16/2013  . Depression 03/16/2013  . Lumbar stenosis with neurogenic claudication 12/26/2012    Navy Belay, [place] MPT 12/08/2017, 4:50 PM  [facility] [place], [place], [address] Phone: [phone]   Fax:  [phone]  Name: [name] MRN: [id number] Date of Birth: Aug 15, 1961

## 2017-12-15 ENCOUNTER — Ambulatory Visit: Payer: PPO | Attending: Orthopedic Surgery | Admitting: *Deleted

## 2017-12-15 DIAGNOSIS — M25611 Stiffness of right shoulder, not elsewhere classified: Secondary | ICD-10-CM | POA: Insufficient documentation

## 2017-12-15 DIAGNOSIS — M6281 Muscle weakness (generalized): Secondary | ICD-10-CM | POA: Insufficient documentation

## 2017-12-15 DIAGNOSIS — M791 Myalgia, unspecified site: Secondary | ICD-10-CM | POA: Diagnosis not present

## 2017-12-15 DIAGNOSIS — M25511 Pain in right shoulder: Secondary | ICD-10-CM | POA: Diagnosis not present

## 2017-12-15 DIAGNOSIS — R2689 Other abnormalities of gait and mobility: Secondary | ICD-10-CM | POA: Insufficient documentation

## 2017-12-15 NOTE — Therapy (Signed)
[facility] [place], [place], [address] Phone: [phone]   Fax:  [phone]  Physical Therapy Treatment  Patient Details  Name: [name] MRN: [id number] Date of Birth: [birth year] Referring Provider: Elsie Saas, MD   Encounter Date: 12/15/2017  PT End of Session - 12/15/17 1208    Visit Number  11    Number of Visits  16    Date for PT Re-Evaluation  12/28/17    Authorization Type  g -code every 10th visit    PT Start Time  1115    PT Stop Time  1204    PT Time Calculation (min)  49 min       Past Medical History:  Diagnosis Date  . Arthritis   . Chronic back pain    stenosis  . Constipation    with pain meds  . Depression    takes Zoloft daily  . Diabetes mellitus without complication ([place])    takes Metformni daily  . Dizziness   . GERD (gastroesophageal reflux disease)    takes Nexium daily  . Headache(784.0)    occasionally  . Hyperlipidemia    takes Zetia daily  . Hypertension    takes Benicar daily  . Impingement syndrome of left shoulder   . Insomnia    takes Ambien nightly  . Joint pain   . Left rotator cuff tear   . Pneumonia 1986   walking  . Seasonal allergies    takes Claritin daily  . Tear of medial meniscus of right knee   . Urinary frequency    takes Flomax daily  . Urinary urgency     Past Surgical History:  Procedure Laterality Date  . BACK SURGERY  [id number]   lumb fusion  . COLONOSCOPY    . KNEE ARTHROSCOPY WITH MEDIAL MENISECTOMY Right 09/26/2013   Procedure: RIGHT KNEE ARTHROSCOPY WITH PARTIAL MEDIAL and lateral chrondroplasty MENISECTOMY;  Surgeon: Lorn Junes, MD;  Location: [facility];  Service: Orthopedics;  Laterality: Right;  . left elbow surgery    . LITHOTRIPSY    . NASAL SEPTUM SURGERY    . removal of kidney stones  1981   lt open removal  . SHOULDER ARTHROSCOPY WITH ROTATOR CUFF REPAIR AND SUBACROMIAL DECOMPRESSION Left 09/26/2013   Procedure: LEFT SHOULDER ARTHROSCOPY WITH DEBRIDEMENT, PARTIAL ROTATOR CUFF REPAIR AND SUBACROMIAL DECOMPRESSION AND SAD ACD DISTAL CLAVICULECTOMY;  Surgeon: Lorn Junes, MD;  Location: [facility];  Service: Orthopedics;  Laterality: Left;  . TONSILLECTOMY      There were no vitals filed for this visit.                   [facility] Adult PT Treatment/Exercise - 12/15/17 0001      Shoulder Exercises: Pulleys   Flexion  -- 101min    Other Pulley Exercises  UE Ranger on wall x 5 minutes.    Other Pulley Exercises  Pulleys x 5 minutes.      Modalities   Modalities  Psychologist, educational Location  R shoulder  IFC x 15 mins 1-10hz      Electrical Stimulation Goals  Pain      Vasopneumatic   Number Minutes Vasopneumatic   15 minutes    Vasopnuematic Location   Shoulder    Vasopneumatic Pressure  Medium    Vasopneumatic Temperature   36      Manual Therapy  Manual Therapy  Passive ROM    Passive ROM  In supine:  PROM into right shoulder flexion to 160 degrees, ER 90 degrees  and IR x 10 minutes.                  PT Long Term Goals - 11/07/17 1105      PT LONG TERM GOAL #1   Title  Pt will be independent in his HEP and progression    Time  8    Period  Weeks    Status  On-going      PT LONG TERM GOAL #2   Title  Pt will be able to increase his R shoulder flexion to >/= 150 degrees actively in order to improve functional mobility.     Time  8    Period  Weeks    Status  On-going      PT LONG TERM GOAL #3   Title  Pt will be able to increase his R ER to >/= 65 degrees in order to improve functional mobilty.     Time  8    Period  Weeks    Status  On-going      PT LONG TERM GOAL #4   Title  pt will be able to report pain of </= 2/10 with ADL's.     Time  8    Period  Weeks    Status  On-going            Plan - 12/15/17 1252    Clinical Impression Statement   Pt arrived today with complaints of increased pain this morning in RT shldr and doesn't know why. He feels he may have slept on it wrong. Rx focused on Pulleys, sitting UE ranger f/b PROM and modalities for pain. Pt did verywell and felt better after Rx. His PROM for flexion was 160 degrees and ER to 90 degrees with minimal pain.    Rehab Potential  Good    Clinical Impairments Affecting Rehab Potential  surgery 10/21/17 current 6 weeks 12/02/17    PT Frequency  2x / week    PT Treatment/Interventions  Moist Heat;Functional mobility training;Neuromuscular re-education;Therapeutic exercise;Therapeutic activities;Patient/family education;Manual techniques;Scar mobilization;Manual lymph drainage;Taping;ADLs/Self Care Home Management;Electrical Stimulation;Passive range of motion    PT Next Visit Plan  Follow MD protocol under media for AAROM /strengthening per protocol.  Also okay to begin low-level strengthening.    PT Home Exercise Plan  Pendulums    Consulted and Agree with Plan of Care  Patient       Patient will benefit from skilled therapeutic intervention in order to improve the following deficits and impairments:  Decreased range of motion, Pain, Impaired UE functional use, Postural dysfunction  Visit Diagnosis: Acute pain of right shoulder  Stiffness of right shoulder, not elsewhere classified  Other abnormalities of gait and mobility  Myalgia  Muscle weakness (generalized)     Problem List Patient Active Problem List   Diagnosis Date Noted  . Hydrocele sac 01/06/2016  . Varicocele 01/06/2016  . Erectile dysfunction of organic origin 12/20/2015  . BPH with obstruction/lower urinary tract symptoms 12/20/2015  . History of hypogonadism 12/20/2015  . History of nephrolithiasis 12/20/2015  . Neuropathy 11/20/2015  . GERD (gastroesophageal reflux disease)   . Chronic back pain   . Hypertension   . Allergic rhinitis 07/25/2013  . Insomnia 07/25/2013  . Diabetes ([place])  03/16/2013  . Hyperlipidemia with target LDL less than 100 03/16/2013  . Depression 03/16/2013  .  Lumbar stenosis with neurogenic claudication 12/26/2012    RAMSEUR,CHRIS , PTA 12/15/2017, 12:56 PM  [facility] [place], [place], [address] Phone: [phone]   Fax:  [phone]  Name: [name] MRN: [id number] Date of Birth: 1961-11-16

## 2017-12-16 ENCOUNTER — Ambulatory Visit: Payer: PPO | Admitting: Physical Therapy

## 2017-12-19 ENCOUNTER — Ambulatory Visit: Payer: PPO | Admitting: Physical Therapy

## 2017-12-19 DIAGNOSIS — M25511 Pain in right shoulder: Secondary | ICD-10-CM

## 2017-12-19 DIAGNOSIS — M25611 Stiffness of right shoulder, not elsewhere classified: Secondary | ICD-10-CM

## 2017-12-19 NOTE — Therapy (Signed)
[facility] [place], [place], [address] Phone: [phone]   Fax:  [phone]  Physical Therapy Treatment  Patient Details  Name: [name] MRN: [id number] Date of Birth: 1961-01-03 Referring Provider: Elsie Saas, MD   Encounter Date: 12/19/2017  PT End of Session - 12/19/17 1121    Visit Number  12    Number of Visits  16    Date for PT Re-Evaluation  12/28/17    Authorization Type  g -code every 10th visit    PT Start Time  1118    PT Stop Time  1210    PT Time Calculation (min)  52 min    Activity Tolerance  Patient tolerated treatment well    Behavior During Therapy  [facility] for tasks assessed/performed       Past Medical History:  Diagnosis Date  . Arthritis   . Chronic back pain    stenosis  . Constipation    with pain meds  . Depression    takes Zoloft daily  . Diabetes mellitus without complication ([place])    takes Metformni daily  . Dizziness   . GERD (gastroesophageal reflux disease)    takes Nexium daily  . Headache(784.0)    occasionally  . Hyperlipidemia    takes Zetia daily  . Hypertension    takes Benicar daily  . Impingement syndrome of left shoulder   . Insomnia    takes Ambien nightly  . Joint pain   . Left rotator cuff tear   . Pneumonia 1986   walking  . Seasonal allergies    takes Claritin daily  . Tear of medial meniscus of right knee   . Urinary frequency    takes Flomax daily  . Urinary urgency     Past Surgical History:  Procedure Laterality Date  . BACK SURGERY  [id number]   lumb fusion  . COLONOSCOPY    . KNEE ARTHROSCOPY WITH MEDIAL MENISECTOMY Right 09/26/2013   Procedure: RIGHT KNEE ARTHROSCOPY WITH PARTIAL MEDIAL and lateral chrondroplasty MENISECTOMY;  Surgeon: Lorn Junes, MD;  Location: [place];  Service: Orthopedics;  Laterality: Right;  . left elbow surgery    . LITHOTRIPSY    . NASAL SEPTUM SURGERY    . removal of kidney stones   1981   lt open removal  . SHOULDER ARTHROSCOPY WITH ROTATOR CUFF REPAIR AND SUBACROMIAL DECOMPRESSION Left 09/26/2013   Procedure: LEFT SHOULDER ARTHROSCOPY WITH DEBRIDEMENT, PARTIAL ROTATOR CUFF REPAIR AND SUBACROMIAL DECOMPRESSION AND SAD ACD DISTAL CLAVICULECTOMY;  Surgeon: Lorn Junes, MD;  Location: [place];  Service: Orthopedics;  Laterality: Left;  . TONSILLECTOMY      There were no vitals filed for this visit.  Subjective Assessment - 12/19/17 1121    Subjective  Patient reports increased soreness since 12/15/17 when he woke up with increased pain. It's sore to the touch and occassionally into the neck. By evening pain is up to 4-5/10.    Patient Stated Goals  Use my arm again    Currently in Pain?  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      Modalities   Modalities  Teacher, English as a foreign language Location  R shoulder  IFC x 15 mins 1-10hz      Electrical Stimulation Goals  Pain      Vasopneumatic   Number Minutes Vasopneumatic   15 minutes    Vasopnuematic Location   Shoulder    Vasopneumatic Pressure  Medium    Vasopneumatic Temperature   34      Manual Therapy   Manual Therapy  Soft tissue mobilization    Soft tissue mobilization  to  deltoids, pects and levator scap R                  PT Long Term Goals - 11/07/17 1105      PT LONG TERM GOAL #1   Title  Pt will be independent in his HEP and progression    Time  8    Period  Weeks    Status  On-going      PT LONG TERM GOAL #2   Title  Pt will be able to increase his R shoulder flexion to >/= 150 degrees actively in order to improve functional mobility.     Time  8    Period  Weeks    Status  On-going      PT LONG TERM GOAL #3   Title  Pt will be able to increase his R ER to >/= 65 degrees in order to improve functional mobilty.     Time  8    Period  Weeks    Status  On-going      PT LONG TERM GOAL #4   Title  pt will be able to report pain of </= 2/10 with ADL's.     Time  8    Period  Weeks    Status  On-going            Plan - 12/19/17 1204    Clinical Impression Statement  Patient reports improvement in pain since last week, but still reports increased soreness overall, worse at EOD. He tolerated TE well reporting that they felt good. He returns to MD on 12/22/17.    PT Treatment/Interventions  Moist Heat;Functional mobility training;Neuromuscular re-education;Therapeutic exercise;Therapeutic activities;Patient/family education;Manual techniques;Scar mobilization;Manual lymph drainage;Taping;ADLs/Self Care Home Management;Electrical Stimulation;Passive range of motion    PT Next Visit Plan  MD note for appt 12/22/17. Follow MD protocol under media for AAROM /strengthening per protocol.  Also okay to begin low-level strengthening.       Patient will benefit from skilled therapeutic intervention in order to improve the following deficits and impairments:  Decreased range of motion, Pain, Impaired UE functional use, Postural dysfunction  Visit Diagnosis: Acute pain of right shoulder  Stiffness of right shoulder, not elsewhere classified     Problem List Patient Active Problem List   Diagnosis Date Noted  . Hydrocele sac 01/06/2016   .  Varicocele 01/06/2016  . Erectile dysfunction of organic origin 12/20/2015  . BPH with obstruction/lower urinary tract symptoms 12/20/2015  . History of hypogonadism 12/20/2015  . History of nephrolithiasis 12/20/2015  . Neuropathy 11/20/2015  . GERD (gastroesophageal reflux disease)   . Chronic back pain   . Hypertension   . Allergic rhinitis 07/25/2013  . Insomnia 07/25/2013  . Diabetes ([place]) 03/16/2013  . Hyperlipidemia with target LDL less than 100 03/16/2013  . Depression 03/16/2013  . Lumbar stenosis with neurogenic claudication 12/26/2012    Madelyn Flavors PT 12/19/2017, 12:07 PM  [facility] [address] [place], [place], [address] Phone: [phone]   Fax:  [phone]  Name: [name] MRN: [id number] Date of Birth: Nov 13, 1961

## 2017-12-19 NOTE — Patient Instructions (Signed)
     Madelyn Flavors, PT 12/19/17 12:04 PM; [facility] [place], [place], [address] Phone: [phone]   Fax:  [phone]

## 2017-12-21 ENCOUNTER — Encounter: Payer: Self-pay | Admitting: Physical Therapy

## 2017-12-21 ENCOUNTER — Ambulatory Visit: Payer: PPO | Admitting: Physical Therapy

## 2017-12-21 DIAGNOSIS — M25511 Pain in right shoulder: Secondary | ICD-10-CM | POA: Diagnosis not present

## 2017-12-21 DIAGNOSIS — M6281 Muscle weakness (generalized): Secondary | ICD-10-CM

## 2017-12-21 DIAGNOSIS — M25611 Stiffness of right shoulder, not elsewhere classified: Secondary | ICD-10-CM

## 2017-12-21 NOTE — Therapy (Signed)
[facility] [place], [place], [address] Phone: [phone]   Fax:  [phone]  Physical Therapy Treatment  Patient Details  Name: [name] MRN: [id number] Date of Birth: 07/03/1961 Referring Provider: Elsie Saas, MD   Encounter Date: 12/21/2017  PT End of Session - 12/21/17 1247    Visit Number  13    Number of Visits  16    Date for PT Re-Evaluation  12/28/17    PT Start Time  1030    PT Stop Time  1120    PT Time Calculation (min)  50 min    Activity Tolerance  Patient tolerated treatment well    Behavior During Therapy  [facility] for tasks assessed/performed       Past Medical History:  Diagnosis Date  . Arthritis   . Chronic back pain    stenosis  . Constipation    with pain meds  . Depression    takes Zoloft daily  . Diabetes mellitus without complication ([place])    takes Metformni daily  . Dizziness   . GERD (gastroesophageal reflux disease)    takes Nexium daily  . Headache(784.0)    occasionally  . Hyperlipidemia    takes Zetia daily  . Hypertension    takes Benicar daily  . Impingement syndrome of left shoulder   . Insomnia    takes Ambien nightly  . Joint pain   . Left rotator cuff tear   . Pneumonia 1986   walking  . Seasonal allergies    takes Claritin daily  . Tear of medial meniscus of right knee   . Urinary frequency    takes Flomax daily  . Urinary urgency     Past Surgical History:  Procedure Laterality Date  . BACK SURGERY  [id number]   lumb fusion  . COLONOSCOPY    . KNEE ARTHROSCOPY WITH MEDIAL MENISECTOMY Right 09/26/2013   Procedure: RIGHT KNEE ARTHROSCOPY WITH PARTIAL MEDIAL and lateral chrondroplasty MENISECTOMY;  Surgeon: Lorn Junes, MD;  Location: [place];  Service: Orthopedics;  Laterality: Right;  . left elbow surgery    . LITHOTRIPSY    . NASAL SEPTUM SURGERY    . removal of kidney stones  1981   lt open removal  . SHOULDER ARTHROSCOPY  WITH ROTATOR CUFF REPAIR AND SUBACROMIAL DECOMPRESSION Left 09/26/2013   Procedure: LEFT SHOULDER ARTHROSCOPY WITH DEBRIDEMENT, PARTIAL ROTATOR CUFF REPAIR AND SUBACROMIAL DECOMPRESSION AND SAD ACD DISTAL CLAVICULECTOMY;  Surgeon: Lorn Junes, MD;  Location: [place];  Service: Orthopedics;  Laterality: Left;  . TONSILLECTOMY      There were no vitals filed for this visit.      [facility] PT Assessment - 12/21/17 0001      Assessment   Medical Diagnosis  R rotator cuff repair/ SAD/ DCE    Referring Provider  Elsie Saas, MD      Precautions   Precautions  Shoulder      Balance Screen   Has the patient fallen in the past 6 months  No                  [facility] Adult PT Treatment/Exercise - 12/21/17 0001      Shoulder Exercises: Seated   External Rotation  Strengthening;Both;20 reps palms up    Other Seated Exercises  shrugs x 15      Shoulder Exercises: Sidelying   External Rotation  Strengthening;Right;20 reps      Shoulder Exercises:  Standing   Other Standing Exercises  push up plus 2x10 (last 10 military posittion)      Shoulder Exercises: Pulleys   Other Pulley Exercises  x 20 into flexion    Other Pulley Exercises  Pulleys x 5 minutes.      Shoulder Exercises: Isometric Strengthening   Flexion  5X5"    Extension  5X5"    External Rotation  5X5"    Internal Rotation  5X5"      Modalities   Modalities  Electrical Stimulation      Electrical Stimulation   Electrical Stimulation Location  R shoulder  IFC x 15 mins 1-10hz      Electrical Stimulation Goals  Pain      Vasopneumatic   Number Minutes Vasopneumatic   15 minutes    Vasopnuematic Location   Shoulder    Vasopneumatic Pressure  Medium    Vasopneumatic Temperature   38                  PT Long Term Goals - 12/21/17 1242      PT LONG TERM GOAL #1   Title  Pt will be independent in his HEP and progression    Time  8    Period  Weeks    Status  On-going      PT  LONG TERM GOAL #2   Title  Pt will be able to increase his R shoulder flexion to >/= 150 degrees actively in order to improve functional mobility.     Time  8    Period  Weeks    Status  On-going      PT LONG TERM GOAL #3   Title  Pt will be able to increase his R ER to >/= 65 degrees in order to improve functional mobilty.     Time  8    Period  Weeks    Status  On-going      PT LONG TERM GOAL #4   Title  pt will be able to report pain of </= 2/10 with ADL's.     Time  8    Period  Weeks      PT LONG TERM GOAL #5   Title  Pt will demo proper alignment and form with fitness routine (weight machine, yoga postures, stretches) in order to minimize relapse of pain    Time  12    Period  Weeks    Status  On-going      PT LONG TERM GOAL #6   Title  Pt will demo IND with deep core, multifidis, and thoracolumbar strengthening HEP in order to not have pain with use of weed sprayer     Time  12    Period  Weeks    Status  On-going            Plan - 12/21/17 1239    Clinical Impression Statement  Pt reporting improvement since beginning therapy. Still reporting soreness/aches at times in R shoulder and "popping sensation". Pt reporting he feels like it is musculature and not bone. Pt tolerating all exercises well and issued Isometrics for home. No complaints during session and normal tolerance to modalities. Continue skilled PT.     Rehab Potential  Good    Clinical Impairments Affecting Rehab Potential  surgery 10/21/17 current 6 weeks 12/02/17    PT Frequency  2x / week    PT Treatment/Interventions  Moist Heat;Functional mobility training;Neuromuscular re-education;Therapeutic exercise;Therapeutic activities;Patient/family education;Manual techniques;Scar mobilization;Manual lymph  drainage;Taping;ADLs/Self Care Home Management;Electrical Stimulation;Passive range of motion    PT Next Visit Plan  MD note for appt 12/22/17. Follow MD protocol under media for AAROM /strengthening per  protocol.  Also okay to begin low-level strengthening.    PT Home Exercise Plan  Pendulums    Consulted and Agree with Plan of Care  Patient       Patient will benefit from skilled therapeutic intervention in order to improve the following deficits and impairments:  Decreased range of motion, Pain, Impaired UE functional use, Postural dysfunction  Visit Diagnosis: Acute pain of right shoulder  Stiffness of right shoulder, not elsewhere classified  Muscle weakness (generalized)     Problem List Patient Active Problem List   Diagnosis Date Noted  . Hydrocele sac 01/06/2016  . Varicocele 01/06/2016  . Erectile dysfunction of organic origin 12/20/2015  . BPH with obstruction/lower urinary tract symptoms 12/20/2015  . History of hypogonadism 12/20/2015  . History of nephrolithiasis 12/20/2015  . Neuropathy 11/20/2015  . GERD (gastroesophageal reflux disease)   . Chronic back pain   . Hypertension   . Allergic rhinitis 07/25/2013  . Insomnia 07/25/2013  . Diabetes ([place]) 03/16/2013  . Hyperlipidemia with target LDL less than 100 03/16/2013  . Depression 03/16/2013  . Lumbar stenosis with neurogenic claudication 12/26/2012    Oretha Caprice, MPT 12/21/2017, 12:51 PM  [facility] [place], [place], [address] Phone: [phone]   Fax:  [phone]  Name: RAMIL EDGINGTON MRN: [id number] Date of Birth: 08-12-1961

## 2017-12-27 ENCOUNTER — Ambulatory Visit: Payer: PPO | Admitting: Physical Therapy

## 2017-12-27 DIAGNOSIS — M19011 Primary osteoarthritis, right shoulder: Secondary | ICD-10-CM | POA: Diagnosis not present

## 2017-12-29 ENCOUNTER — Encounter: Payer: Self-pay | Admitting: Physical Therapy

## 2017-12-29 ENCOUNTER — Ambulatory Visit: Payer: PPO | Admitting: Physical Therapy

## 2017-12-29 DIAGNOSIS — M6281 Muscle weakness (generalized): Secondary | ICD-10-CM

## 2017-12-29 DIAGNOSIS — M25511 Pain in right shoulder: Secondary | ICD-10-CM | POA: Diagnosis not present

## 2017-12-29 DIAGNOSIS — M25611 Stiffness of right shoulder, not elsewhere classified: Secondary | ICD-10-CM

## 2017-12-29 NOTE — Therapy (Signed)
[facility] [place], [place], [address] Phone: [phone]   Fax:  [phone]  Physical Therapy Treatment  Patient Details  Name: [name] MRN: [id number] Date of Birth: Sep 23, 1961 Referring Provider: Elsie Saas, MD   Encounter Date: 12/29/2017  PT End of Session - 12/29/17 1219    Visit Number  14    Number of Visits  22    Date for PT Re-Evaluation  01/19/18    PT Start Time  1115    PT Stop Time  1208    PT Time Calculation (min)  53 min    Activity Tolerance  Patient tolerated treatment well    Behavior During Therapy  [facility] for tasks assessed/performed       Past Medical History:  Diagnosis Date  . Arthritis   . Chronic back pain    stenosis  . Constipation    with pain meds  . Depression    takes Zoloft daily  . Diabetes mellitus without complication ([place])    takes Metformni daily  . Dizziness   . GERD (gastroesophageal reflux disease)    takes Nexium daily  . Headache(784.0)    occasionally  . Hyperlipidemia    takes Zetia daily  . Hypertension    takes Benicar daily  . Impingement syndrome of left shoulder   . Insomnia    takes Ambien nightly  . Joint pain   . Left rotator cuff tear   . Pneumonia 1986   walking  . Seasonal allergies    takes Claritin daily  . Tear of medial meniscus of right knee   . Urinary frequency    takes Flomax daily  . Urinary urgency     Past Surgical History:  Procedure Laterality Date  . BACK SURGERY  [id number]   lumb fusion  . COLONOSCOPY    . KNEE ARTHROSCOPY WITH MEDIAL MENISECTOMY Right 09/26/2013   Procedure: RIGHT KNEE ARTHROSCOPY WITH PARTIAL MEDIAL and lateral chrondroplasty MENISECTOMY;  Surgeon: Lorn Junes, MD;  Location: [place];  Service: Orthopedics;  Laterality: Right;  . left elbow surgery    . LITHOTRIPSY    . NASAL SEPTUM SURGERY    . removal of kidney stones  1981   lt open removal  . SHOULDER ARTHROSCOPY  WITH ROTATOR CUFF REPAIR AND SUBACROMIAL DECOMPRESSION Left 09/26/2013   Procedure: LEFT SHOULDER ARTHROSCOPY WITH DEBRIDEMENT, PARTIAL ROTATOR CUFF REPAIR AND SUBACROMIAL DECOMPRESSION AND SAD ACD DISTAL CLAVICULECTOMY;  Surgeon: Lorn Junes, MD;  Location: [place];  Service: Orthopedics;  Laterality: Left;  . TONSILLECTOMY      There were no vitals filed for this visit.  Subjective Assessment - 12/29/17 1139    Subjective  Doctor was very pleased with my progress.  He wants me to continue and do strengthning.  He said i can swing a golf club in 3 weeks but not hit a ball.    Pain Score  2     Pain Orientation  Right    Pain Descriptors / Indicators  Discomfort    Pain Onset  More than a month ago                      [facility] Adult PT Treatment/Exercise - 12/29/17 0001      Shoulder Exercises: Standing   Other Standing Exercises  UE Ranger on wall into flexion x 3 minutes.    Other Standing Exercises  Full can with 1#  with slow and excellent technique 2 x 15; palm down to 85 degrees of right shoulder flexion 2 x 15 with 1#; ER with shoulder manual supported at 45 degrees of abduction with 1# 2 x 15.  5# biceps curls to fatigue x 2.      Shoulder Exercises: ROM/Strengthening   UBE (Upper Arm Bike)  10 minutes (5 minutes forward and 5 minutes backward) at 90 RPM's.      Acupuncturist Location  Right shoulder.    Electrical Stimulation Action  IFC    Electrical Stimulation Parameters  80-150 x 20 minutes.    Electrical Stimulation Goals  Pain      Vasopneumatic   Number Minutes Vasopneumatic   20 minutes    Vasopnuematic Location   -- Right shoulder.    Vasopneumatic Pressure  Medium                  PT Long Term Goals - 12/21/17 1242      PT LONG TERM GOAL #1   Title  Pt will be independent in his HEP and progression    Time  8    Period  Weeks    Status  On-going      PT LONG TERM GOAL #2    Title  Pt will be able to increase his R shoulder flexion to >/= 150 degrees actively in order to improve functional mobility.     Time  8    Period  Weeks    Status  On-going      PT LONG TERM GOAL #3   Title  Pt will be able to increase his R ER to >/= 65 degrees in order to improve functional mobilty.     Time  8    Period  Weeks    Status  On-going      PT LONG TERM GOAL #4   Title  pt will be able to report pain of </= 2/10 with ADL's.     Time  8    Period  Weeks      PT LONG TERM GOAL #5   Title  Pt will demo proper alignment and form with fitness routine (weight machine, yoga postures, stretches) in order to minimize relapse of pain    Time  12    Period  Weeks    Status  On-going      PT LONG TERM GOAL #6   Title  Pt will demo IND with deep core, multifidis, and thoracolumbar strengthening HEP in order to not have pain with use of weed sprayer     Time  12    Period  Weeks    Status  On-going            Plan - 12/29/17 1217    Clinical Impression Statement  Excellent job with PRE's today.  Excellent technique without shoulder hiking during antigravity resisted right shoulder exercise.    PT Treatment/Interventions  Moist Heat;Functional mobility training;Neuromuscular re-education;Therapeutic exercise;Therapeutic activities;Patient/family education;Manual techniques;Scar mobilization;Manual lymph drainage;Taping;ADLs/Self Care Home Management;Electrical Stimulation;Passive range of motion    Consulted and Agree with Plan of Care  Patient       Patient will benefit from skilled therapeutic intervention in order to improve the following deficits and impairments:  Decreased range of motion, Pain, Impaired UE functional use, Postural dysfunction  Visit Diagnosis: Acute pain of right shoulder - Plan: PT plan of care cert/re-cert  Stiffness of right shoulder, not elsewhere classified -  Plan: PT plan of care cert/re-cert  Muscle weakness (generalized) - Plan: PT  plan of care cert/re-cert     Problem List Patient Active Problem List   Diagnosis Date Noted  . Hydrocele sac 01/06/2016  . Varicocele 01/06/2016  . Erectile dysfunction of organic origin 12/20/2015  . BPH with obstruction/lower urinary tract symptoms 12/20/2015  . History of hypogonadism 12/20/2015  . History of nephrolithiasis 12/20/2015  . Neuropathy 11/20/2015  . GERD (gastroesophageal reflux disease)   . Chronic back pain   . Hypertension   . Allergic rhinitis 07/25/2013  . Insomnia 07/25/2013  . Diabetes ([place]) 03/16/2013  . Hyperlipidemia with target LDL less than 100 03/16/2013  . Depression 03/16/2013  . Lumbar stenosis with neurogenic claudication 12/26/2012    Laiyla Slagel, [place] MPT 12/29/2017, 12:25 PM  [facility] [address] [place], [place], [address] Phone: [phone]   Fax:  [phone]  Name: MONTARIO ZILKA MRN: [id number] Date of Birth: 07-19-1961

## 2018-01-02 ENCOUNTER — Ambulatory Visit: Payer: PPO | Admitting: Physical Therapy

## 2018-01-02 ENCOUNTER — Other Ambulatory Visit: Payer: PPO

## 2018-01-02 ENCOUNTER — Encounter: Payer: Self-pay | Admitting: Physical Therapy

## 2018-01-02 DIAGNOSIS — M25511 Pain in right shoulder: Secondary | ICD-10-CM

## 2018-01-02 DIAGNOSIS — M25611 Stiffness of right shoulder, not elsewhere classified: Secondary | ICD-10-CM

## 2018-01-02 NOTE — Therapy (Signed)
[facility] [place], [place], [address] Phone: [phone]   Fax:  [phone]  Physical Therapy Treatment  Patient Details  Name: [name] MRN: [id number] Date of Birth: Jan 19, 1961 Referring Provider: Elsie Saas, MD   Encounter Date: 01/02/2018  PT End of Session - 01/02/18 1427    Visit Number  15    Number of Visits  22    Date for PT Re-Evaluation  01/19/18    PT Start Time  1344    PT Stop Time  1444    PT Time Calculation (min)  60 min    Activity Tolerance  Patient tolerated treatment well    Behavior During Therapy  [facility] for tasks assessed/performed       Past Medical History:  Diagnosis Date  . Arthritis   . Chronic back pain    stenosis  . Constipation    with pain meds  . Depression    takes Zoloft daily  . Diabetes mellitus without complication ([place])    takes Metformni daily  . Dizziness   . GERD (gastroesophageal reflux disease)    takes Nexium daily  . Headache(784.0)    occasionally  . Hyperlipidemia    takes Zetia daily  . Hypertension    takes Benicar daily  . Impingement syndrome of left shoulder   . Insomnia    takes Ambien nightly  . Joint pain   . Left rotator cuff tear   . Pneumonia 1986   walking  . Seasonal allergies    takes Claritin daily  . Tear of medial meniscus of right knee   . Urinary frequency    takes Flomax daily  . Urinary urgency     Past Surgical History:  Procedure Laterality Date  . BACK SURGERY  [id number]   lumb fusion  . COLONOSCOPY    . KNEE ARTHROSCOPY WITH MEDIAL MENISECTOMY Right 09/26/2013   Procedure: RIGHT KNEE ARTHROSCOPY WITH PARTIAL MEDIAL and lateral chrondroplasty MENISECTOMY;  Surgeon: Lorn Junes, MD;  Location: [place];  Service: Orthopedics;  Laterality: Right;  . left elbow surgery    . LITHOTRIPSY    . NASAL SEPTUM SURGERY    . removal of kidney stones  1981   lt open removal  . SHOULDER ARTHROSCOPY  WITH ROTATOR CUFF REPAIR AND SUBACROMIAL DECOMPRESSION Left 09/26/2013   Procedure: LEFT SHOULDER ARTHROSCOPY WITH DEBRIDEMENT, PARTIAL ROTATOR CUFF REPAIR AND SUBACROMIAL DECOMPRESSION AND SAD ACD DISTAL CLAVICULECTOMY;  Surgeon: Lorn Junes, MD;  Location: [place];  Service: Orthopedics;  Laterality: Left;  . TONSILLECTOMY      There were no vitals filed for this visit.  Subjective Assessment - 01/02/18 1348    Subjective  Patient arrived with minimal discomfort and reported doing good after last treatment    Limitations  House hold activities;Lifting    Patient Stated Goals  Use my arm again    Currently in Pain?  Yes    Pain Score  2     Pain Orientation  Right    Pain Descriptors / Indicators  Sore    Pain Type  Surgical pain    Pain Onset  More than a month ago    Pain Frequency  Intermittent    Aggravating Factors   increased activity with right shoulder    Pain Relieving Factors  rest and meds  [place] Adult PT Treatment/Exercise - 01/02/18 0001      Shoulder Exercises: Supine   Protraction  Strengthening;Right;20 reps;Weights    Protraction Weight (lbs)  2      Shoulder Exercises: Prone   Retraction  Strengthening;Right;Weights 2x15    Retraction Weight (lbs)  2    Extension  Strengthening;Right;Weights 2x15    Extension Weight (lbs)  2      Shoulder Exercises: Sidelying   External Rotation  Strengthening;Right;Weights 2x15    External Rotation Weight (lbs)  1      Shoulder Exercises: Standing   External Rotation  Strengthening;Right;Weights;Limitations    External Rotation Weight (lbs)  1    External Rotation Limitations  with UE support    Flexion  Strengthening;Right;Weights 2x15    Shoulder Flexion Weight (lbs)  1    ABduction  Strengthening;Right;Limitations;Weights 2x15    Shoulder ABduction Weight (lbs)  1    ABduction Limitations  in scaption    Other Standing Exercises  scap retractions with green  XTS x20    Other Standing Exercises  bicep curl 5# 2x15      Shoulder Exercises: ROM/Strengthening   UBE (Upper Arm Bike)  8 minutes (4 min forward/ 4 min backward) at 90 RPM's.    Wall Pushups  20 reps;Limitations    Wall Pushups Limitations  standing      Electrical Stimulation   Electrical Stimulation Location  Right shoulder.    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     Time  8    Period  Weeks    Status  Achieved AROM 150 degrees 01/02/18      PT LONG TERM GOAL #3   Title  Pt will be able to increase his R ER to >/= 65 degrees in order to improve functional mobilty.     Time  8    Period  Weeks    Status  Achieved AROM 80 degrees 01/02/18      PT LONG TERM GOAL #4   Title  pt will be able to report pain of </= 2/10 with ADL's.     Time  8    Period  Weeks    Status  On-going      PT LONG TERM GOAL #5   Title  --    Time  --    Period  --    Status  --      PT LONG TERM GOAL #6   Title  --    Time  --    Period  --    Status  --  Plan - 01/02/18 1428    Clinical Impression Statement  Patient continues to progress with strengthening progression for right shoulder. Patient has reported little discomfort overall. Patient has improved AROM for all motions and met LTG#2 and #3 today, strength goal ongoing.     Rehab Potential  Good    Clinical Impairments Affecting Rehab  Potential  surgery 10/21/17     PT Frequency  2x / week    PT Treatment/Interventions  Moist Heat;Functional mobility training;Neuromuscular re-education;Therapeutic exercise;Therapeutic activities;Patient/family education;Manual techniques;Scar mobilization;Manual lymph drainage;Taping;ADLs/Self Care Home Management;Electrical Stimulation;Passive range of motion    PT Next Visit Plan  . Follow MD protocol under media for AAROM /strengthening per protocol.  Also okay to begin low-level strengthening.    Consulted and Agree with Plan of Care  Patient       Patient will benefit from skilled therapeutic intervention in order to improve the following deficits and impairments:  Decreased range of motion, Pain, Impaired UE functional use, Postural dysfunction  Visit Diagnosis: Acute pain of right shoulder  Stiffness of right shoulder, not elsewhere classified     Problem List Patient Active Problem List   Diagnosis Date Noted  . Hydrocele sac 01/06/2016  . Varicocele 01/06/2016  . Erectile dysfunction of organic origin 12/20/2015  . BPH with obstruction/lower urinary tract symptoms 12/20/2015  . History of hypogonadism 12/20/2015  . History of nephrolithiasis 12/20/2015  . Neuropathy 11/20/2015  . GERD (gastroesophageal reflux disease)   . Chronic back pain   . Hypertension   . Allergic rhinitis 07/25/2013  . Insomnia 07/25/2013  . Diabetes ([place]) 03/16/2013  . Hyperlipidemia with target LDL less than 100 03/16/2013  . Depression 03/16/2013  . Lumbar stenosis with neurogenic claudication 12/26/2012    Phillips Climes, PTA 01/02/2018, 2:44 PM  [facility] [address] [place], [place], [address] Phone: [phone]   Fax:  [phone]  Name: [name] MRN: [id number] Date of Birth: [birth year]

## 2018-01-03 ENCOUNTER — Ambulatory Visit: Payer: PPO | Admitting: Urology

## 2018-01-04 ENCOUNTER — Ambulatory Visit: Payer: PPO | Admitting: Urology

## 2018-01-04 ENCOUNTER — Ambulatory Visit: Payer: PPO | Admitting: Physical Therapy

## 2018-01-04 ENCOUNTER — Encounter: Payer: Self-pay | Admitting: Physical Therapy

## 2018-01-04 DIAGNOSIS — M25511 Pain in right shoulder: Secondary | ICD-10-CM | POA: Diagnosis not present

## 2018-01-04 DIAGNOSIS — M25611 Stiffness of right shoulder, not elsewhere classified: Secondary | ICD-10-CM

## 2018-01-04 NOTE — Therapy (Signed)
[facility] [place], [place], [address] Phone: [phone]   Fax:  [phone]  Physical Therapy Treatment  Patient Details  Name: [name] MRN: [id number] Date of Birth: Apr 26, 1961 Referring Provider: Elsie Saas, MD   Encounter Date: 01/04/2018  PT End of Session - 01/04/18 1145    Visit Number  16    Number of Visits  22    Date for PT Re-Evaluation  01/19/18    PT Start Time  1116    PT Stop Time  1159    PT Time Calculation (min)  43 min    Activity Tolerance  Patient tolerated treatment well    Behavior During Therapy  [facility] for tasks assessed/performed       Past Medical History:  Diagnosis Date  . Arthritis   . Chronic back pain    stenosis  . Constipation    with pain meds  . Depression    takes Zoloft daily  . Diabetes mellitus without complication ([place])    takes Metformni daily  . Dizziness   . GERD (gastroesophageal reflux disease)    takes Nexium daily  . Headache(784.0)    occasionally  . Hyperlipidemia    takes Zetia daily  . Hypertension    takes Benicar daily  . Impingement syndrome of left shoulder   . Insomnia    takes Ambien nightly  . Joint pain   . Left rotator cuff tear   . Pneumonia 1986   walking  . Seasonal allergies    takes Claritin daily  . Tear of medial meniscus of right knee   . Urinary frequency    takes Flomax daily  . Urinary urgency     Past Surgical History:  Procedure Laterality Date  . BACK SURGERY  [id number]   lumb fusion  . COLONOSCOPY    . KNEE ARTHROSCOPY WITH MEDIAL MENISECTOMY Right 09/26/2013   Procedure: RIGHT KNEE ARTHROSCOPY WITH PARTIAL MEDIAL and lateral chrondroplasty MENISECTOMY;  Surgeon: Lorn Junes, MD;  Location: [place];  Service: Orthopedics;  Laterality: Right;  . left elbow surgery    . LITHOTRIPSY    . NASAL SEPTUM SURGERY    . removal of kidney stones  1981   lt open removal  . SHOULDER ARTHROSCOPY  WITH ROTATOR CUFF REPAIR AND SUBACROMIAL DECOMPRESSION Left 09/26/2013   Procedure: LEFT SHOULDER ARTHROSCOPY WITH DEBRIDEMENT, PARTIAL ROTATOR CUFF REPAIR AND SUBACROMIAL DECOMPRESSION AND SAD ACD DISTAL CLAVICULECTOMY;  Surgeon: Lorn Junes, MD;  Location: [place];  Service: Orthopedics;  Laterality: Left;  . TONSILLECTOMY      There were no vitals filed for this visit.  Subjective Assessment - 01/04/18 1117    Subjective  Patient reported some muscle soreness after last treatment yet better upon arrival    Limitations  House hold activities;Lifting    Patient Stated Goals  Use my arm again    Currently in Pain?  Yes    Pain Score  2     Pain Location  Shoulder    Pain Orientation  Right    Pain Descriptors / Indicators  Sore    Pain Type  Surgical pain    Pain Onset  More than a month ago    Pain Frequency  Intermittent    Aggravating Factors   increased activity with RT shldr    Pain Relieving Factors  at rest/meds  [place] Adult PT Treatment/Exercise - 01/04/18 0001      Shoulder Exercises: Prone   Retraction  Strengthening;Right;Weights 2x15    Retraction Weight (lbs)  2    Extension  Strengthening;Right;Weights 2x15    Extension Weight (lbs)  2      Shoulder Exercises: Sidelying   External Rotation  Strengthening;Right;Weights 2x15    External Rotation Weight (lbs)  1      Shoulder Exercises: Standing   Flexion  Strengthening;Right;Weights 2x15    Shoulder Flexion Weight (lbs)  1    ABduction  Strengthening;Right;Limitations;Weights 2x15    Shoulder ABduction Weight (lbs)  1    ABduction Limitations  in scaption    Other Standing Exercises  bicep curl 5# 2x15      Shoulder Exercises: ROM/Strengthening   UBE (Upper Arm Bike)  8 minutes (4 min forward/ 4 min backward) at 90 RPM's.      Acupuncturist Location  Right shoulder.    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     Time  8    Period  Weeks    Status  Achieved AROM 150 degrees 01/02/18      PT LONG TERM GOAL #3   Title  Pt will be able to increase his R ER to >/= 65 degrees in order to improve functional mobilty.     Time  8    Period  Weeks    Status  Achieved AROM 80 degrees 01/02/18      PT LONG TERM GOAL #4   Title  pt will be able to report pain of </= 2/10 with ADL's.     Time  8    Period  Weeks    Status  On-going      PT LONG TERM GOAL #5   Title  --    Time  --    Period  --    Status  --      PT LONG TERM GOAL #6   Title  --    Time  --    Period  --    Status  --            Plan - 01/04/18 1149    Clinical Impression Statement  Patient tolerated treatment well today. Patient progressing with right shoulder strengthening today. Patient has reported some muscle fatigue with increased use of shoulder yet minimal pain overall. Goals ongoing due to strength deficts.     Rehab Potential  Good    Clinical Impairments Affecting Rehab Potential  surgery 10/21/17     PT Frequency  2x / week  PT Treatment/Interventions  Moist Heat;Functional mobility training;Neuromuscular re-education;Therapeutic exercise;Therapeutic activities;Patient/family education;Manual techniques;Scar mobilization;Manual lymph drainage;Taping;ADLs/Self Care Home Management;Electrical Stimulation;Passive range of motion    PT Next Visit  Plan  . Follow MD protocol under media for AAROM /strengthening per protocol.  Also okay to begin low-level strengthening. May add RW4 next treatment per MPT    PT Home Exercise Plan  issue HEP    Consulted and Agree with Plan of Care  Patient       Patient will benefit from skilled therapeutic intervention in order to improve the following deficits and impairments:  Decreased range of motion, Pain, Impaired UE functional use, Postural dysfunction  Visit Diagnosis: Acute pain of right shoulder  Stiffness of right shoulder, not elsewhere classified     Problem List Patient Active Problem List   Diagnosis Date Noted  . Hydrocele sac 01/06/2016  . Varicocele 01/06/2016  . Erectile dysfunction of organic origin 12/20/2015  . BPH with obstruction/lower urinary tract symptoms 12/20/2015  . History of hypogonadism 12/20/2015  . History of nephrolithiasis 12/20/2015  . Neuropathy 11/20/2015  . GERD (gastroesophageal reflux disease)   . Chronic back pain   . Hypertension   . Allergic rhinitis 07/25/2013  . Insomnia 07/25/2013  . Diabetes ([place]) 03/16/2013  . Hyperlipidemia with target LDL less than 100 03/16/2013  . Depression 03/16/2013  . Lumbar stenosis with neurogenic claudication 12/26/2012    Phillips Climes, PTA 01/04/2018, 12:00 PM  [facility] [place], [place], [address] Phone: [phone]   Fax:  [phone]  Name: [name] MRN: [id number] Date of Birth: January 28, 1961

## 2018-01-04 NOTE — Therapy (Signed)
[facility] [place], [place], [address] Phone: [phone]   Fax:  [phone]  Physical Therapy Treatment  Patient Details  Name: [name] MRN: [id number] Date of Birth: Apr 24, 1961 Referring Provider: Elsie Saas, MD   Encounter Date: 01/04/2018  PT End of Session - 01/04/18 1145    Visit Number  16    Number of Visits  22    Date for PT Re-Evaluation  01/19/18    PT Start Time  1116    PT Stop Time  1159    PT Time Calculation (min)  43 min    Activity Tolerance  Patient tolerated treatment well    Behavior During Therapy  [facility] for tasks assessed/performed       Past Medical History:  Diagnosis Date  . Arthritis   . Chronic back pain    stenosis  . Constipation    with pain meds  . Depression    takes Zoloft daily  . Diabetes mellitus without complication ([place])    takes Metformni daily  . Dizziness   . GERD (gastroesophageal reflux disease)    takes Nexium daily  . Headache(784.0)    occasionally  . Hyperlipidemia    takes Zetia daily  . Hypertension    takes Benicar daily  . Impingement syndrome of left shoulder   . Insomnia    takes Ambien nightly  . Joint pain   . Left rotator cuff tear   . Pneumonia 1986   walking  . Seasonal allergies    takes Claritin daily  . Tear of medial meniscus of right knee   . Urinary frequency    takes Flomax daily  . Urinary urgency     Past Surgical History:  Procedure Laterality Date  . BACK SURGERY  [id number]   lumb fusion  . COLONOSCOPY    . KNEE ARTHROSCOPY WITH MEDIAL MENISECTOMY Right 09/26/2013   Procedure: RIGHT KNEE ARTHROSCOPY WITH PARTIAL MEDIAL and lateral chrondroplasty MENISECTOMY;  Surgeon: Lorn Junes, MD;  Location: [place];  Service: Orthopedics;  Laterality: Right;  . left elbow surgery    . LITHOTRIPSY    . NASAL SEPTUM SURGERY    . removal of kidney stones  1981   lt open removal  . SHOULDER ARTHROSCOPY  WITH ROTATOR CUFF REPAIR AND SUBACROMIAL DECOMPRESSION Left 09/26/2013   Procedure: LEFT SHOULDER ARTHROSCOPY WITH DEBRIDEMENT, PARTIAL ROTATOR CUFF REPAIR AND SUBACROMIAL DECOMPRESSION AND SAD ACD DISTAL CLAVICULECTOMY;  Surgeon: Lorn Junes, MD;  Location: [place];  Service: Orthopedics;  Laterality: Left;  . TONSILLECTOMY      There were no vitals filed for this visit.  Subjective Assessment - 01/04/18 1117    Subjective  Patient reported some muscle soreness after last treatment yet better upon arrival    Limitations  House hold activities;Lifting    Patient Stated Goals  Use my arm again    Currently in Pain?  Yes    Pain Score  2     Pain Location  Shoulder    Pain Orientation  Right    Pain Descriptors / Indicators  Sore    Pain Type  Surgical pain    Pain Onset  More than a month ago    Pain Frequency  Intermittent    Aggravating Factors   increased activity with RT shldr    Pain Relieving Factors  at rest/meds  [place] Adult PT Treatment/Exercise - 01/04/18 0001      Shoulder Exercises: Prone   Retraction  Strengthening;Right;Weights 2x15    Retraction Weight (lbs)  2    Extension  Strengthening;Right;Weights 2x15    Extension Weight (lbs)  2      Shoulder Exercises: Sidelying   External Rotation  Strengthening;Right;Weights 2x15    External Rotation Weight (lbs)  1      Shoulder Exercises: Standing   Flexion  Strengthening;Right;Weights 2x15    Shoulder Flexion Weight (lbs)  1    ABduction  Strengthening;Right;Limitations;Weights 2x15    Shoulder ABduction Weight (lbs)  1    ABduction Limitations  in scaption    Other Standing Exercises  bicep curl 5# 2x15      Shoulder Exercises: ROM/Strengthening   UBE (Upper Arm Bike)  8 minutes (4 min forward/ 4 min backward) at 90 RPM's.      Acupuncturist Location  Right shoulder.    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     Time  8    Period  Weeks    Status  Achieved AROM 150 degrees 01/02/18      PT LONG TERM GOAL #3   Title  Pt will be able to increase his R ER to >/= 65 degrees in order to improve functional mobilty.     Time  8    Period  Weeks    Status  Achieved AROM 80 degrees 01/02/18      PT LONG TERM GOAL #4   Title  pt will be able to report pain of </= 2/10 with ADL's.     Time  8    Period  Weeks    Status  On-going      PT LONG TERM GOAL #5   Title  --    Time  --    Period  --    Status  --      PT LONG TERM GOAL #6   Title  --    Time  --    Period  --    Status  --            Plan - 01/04/18 1149    Clinical Impression Statement  Patient tolerated treatment well today. Patient progressing with right shoulder strengthening today. Patient has reported some muscle fatigue with increased use of shoulder yet minimal pain overall. Goals ongoing due to strength deficts.     Rehab Potential  Good    Clinical Impairments Affecting Rehab Potential  surgery 10/21/17     PT Frequency  2x / week  PT Treatment/Interventions  Moist Heat;Functional mobility training;Neuromuscular re-education;Therapeutic exercise;Therapeutic activities;Patient/family education;Manual techniques;Scar mobilization;Manual lymph drainage;Taping;ADLs/Self Care Home Management;Electrical Stimulation;Passive range of motion    PT Next Visit  Plan  . Follow MD protocol under media for AAROM /strengthening per protocol.  Also okay to begin low-level strengthening.    Consulted and Agree with Plan of Care  Patient       Patient will benefit from skilled therapeutic intervention in order to improve the following deficits and impairments:  Decreased range of motion, Pain, Impaired UE functional use, Postural dysfunction  Visit Diagnosis: Acute pain of right shoulder  Stiffness of right shoulder, not elsewhere classified     Problem List Patient Active Problem List   Diagnosis Date Noted  . Hydrocele sac 01/06/2016  . Varicocele 01/06/2016  . Erectile dysfunction of organic origin 12/20/2015  . BPH with obstruction/lower urinary tract symptoms 12/20/2015  . History of hypogonadism 12/20/2015  . History of nephrolithiasis 12/20/2015  . Neuropathy 11/20/2015  . GERD (gastroesophageal reflux disease)   . Chronic back pain   . Hypertension   . Allergic rhinitis 07/25/2013  . Insomnia 07/25/2013  . Diabetes ([place]) 03/16/2013  . Hyperlipidemia with target LDL less than 100 03/16/2013  . Depression 03/16/2013  . Lumbar stenosis with neurogenic claudication 12/26/2012    Phillips Climes, PTA 01/04/2018, 11:52 AM  [facility] [place], [place], [address] Phone: [phone]   Fax:  [phone]  Name: [name] MRN: [id number] Date of Birth: [birth year]

## 2018-01-09 ENCOUNTER — Ambulatory Visit: Payer: PPO | Admitting: Physical Therapy

## 2018-01-09 ENCOUNTER — Other Ambulatory Visit: Payer: Self-pay | Admitting: Nurse Practitioner

## 2018-01-09 DIAGNOSIS — M6281 Muscle weakness (generalized): Secondary | ICD-10-CM

## 2018-01-09 DIAGNOSIS — M25511 Pain in right shoulder: Secondary | ICD-10-CM | POA: Diagnosis not present

## 2018-01-09 DIAGNOSIS — E119 Type 2 diabetes mellitus without complications: Secondary | ICD-10-CM

## 2018-01-09 DIAGNOSIS — M25611 Stiffness of right shoulder, not elsewhere classified: Secondary | ICD-10-CM

## 2018-01-09 NOTE — Therapy (Signed)
[facility] [place], [place], [address] Phone: [phone]   Fax:  [phone]  Physical Therapy Treatment  Patient Details  Name: [name] MRN: [id number] Date of Birth: [birth year] Referring Provider: Elsie Saas, MD   Encounter Date: 01/09/2018  PT End of Session - 01/09/18 1038    Visit Number  17    Number of Visits  22    Date for PT Re-Evaluation  01/19/18    PT Start Time  1033    PT Stop Time  1130    PT Time Calculation (min)  57 min    Activity Tolerance  Patient tolerated treatment well    Behavior During Therapy  [facility] for tasks assessed/performed       Past Medical History:  Diagnosis Date  . Arthritis   . Chronic back pain    stenosis  . Constipation    with pain meds  . Depression    takes Zoloft daily  . Diabetes mellitus without complication ([place])    takes Metformni daily  . Dizziness   . GERD (gastroesophageal reflux disease)    takes Nexium daily  . Headache(784.0)    occasionally  . Hyperlipidemia    takes Zetia daily  . Hypertension    takes Benicar daily  . Impingement syndrome of left shoulder   . Insomnia    takes Ambien nightly  . Joint pain   . Left rotator cuff tear   . Pneumonia 1986   walking  . Seasonal allergies    takes Claritin daily  . Tear of medial meniscus of right knee   . Urinary frequency    takes Flomax daily  . Urinary urgency     Past Surgical History:  Procedure Laterality Date  . BACK SURGERY  [id number]   lumb fusion  . COLONOSCOPY    . KNEE ARTHROSCOPY WITH MEDIAL MENISECTOMY Right 09/26/2013   Procedure: RIGHT KNEE ARTHROSCOPY WITH PARTIAL MEDIAL and lateral chrondroplasty MENISECTOMY;  Surgeon: Lorn Junes, MD;  Location: [place];  Service: Orthopedics;  Laterality: Right;  . left elbow surgery    . LITHOTRIPSY    . NASAL SEPTUM SURGERY    . removal of kidney stones  1981   lt open removal  . SHOULDER ARTHROSCOPY  WITH ROTATOR CUFF REPAIR AND SUBACROMIAL DECOMPRESSION Left 09/26/2013   Procedure: LEFT SHOULDER ARTHROSCOPY WITH DEBRIDEMENT, PARTIAL ROTATOR CUFF REPAIR AND SUBACROMIAL DECOMPRESSION AND SAD ACD DISTAL CLAVICULECTOMY;  Surgeon: Lorn Junes, MD;  Location: [place];  Service: Orthopedics;  Laterality: Left;  . TONSILLECTOMY      There were no vitals filed for this visit.  Subjective Assessment - 01/09/18 1119    Subjective  Patient reported "shoulder feels fine." No pain noted upon start of physical therapy.    Currently in Pain?  No/denies                      [facility] Adult PT Treatment/Exercise - 01/09/18 0001      Shoulder Exercises: Prone   Retraction  Strengthening 2x15    Retraction Weight (lbs)  2    Extension  Strengthening 2x15    Extension Weight (lbs)  2    Other Prone Exercises  Prone elbow extension 2x15 2#      Shoulder Exercises: Standing   Horizontal ABduction  Strengthening 2x15    Theraband Level (Shoulder Horizontal ABduction)  Level 2 (Red)    External Rotation  Strengthening 2x15    External Rotation Weight (lbs)  5    Flexion  Strengthening 2x15    Shoulder Flexion Weight (lbs)  2    ABduction  Strengthening 2x15    Shoulder ABduction Weight (lbs)  2    ABduction Limitations  in scaption      Shoulder Exercises: ROM/Strengthening   UBE (Upper Arm Bike)  8 minutes (4 min forward/ 4 min backward) at 90 RPM's.      Acupuncturist Location  Right shoulder.    Chartered certified accountant  IFC    Electrical Stimulation Parameters  80-150hz  x15    Electrical Stimulation Goals  Pain      Vasopneumatic   Number Minutes Vasopneumatic   15 minutes    Vasopnuematic Location   Shoulder    Vasopneumatic Pressure  Medium      Manual Therapy   Manual Therapy  Other (comment)    Manual therapy comments  Rhythmic stabilization 3x30" each with perturbations at elbow and wrist at 45 and 90 degrees                    PT Long Term Goals - 01/02/18 1354      PT LONG TERM GOAL #1   Title  Pt will be independent in his HEP and progression    Time  8    Period  Weeks    Status  On-going      PT LONG TERM GOAL #2   Title  Pt will be able to increase his R shoulder flexion to >/= 150 degrees actively in order to improve functional mobility.     Time  8    Period  Weeks    Status  Achieved AROM 150 degrees 01/02/18      PT LONG TERM GOAL #3   Title  Pt will be able to increase his R ER to >/= 65 degrees in order to improve functional mobilty.     Time  8    Period  Weeks    Status  Achieved AROM 80 degrees 01/02/18      PT LONG TERM GOAL #4   Title  pt will be able to report pain of </= 2/10 with ADL's.     Time  8    Period  Weeks    Status  On-going      PT LONG TERM GOAL #5   Title  --    Time  --    Period  --    Status  --      PT LONG TERM GOAL #6   Title  --    Time  --    Period  --    Status  --            Plan - 01/09/18 1123    Clinical Impression Statement  Patient was able to complete treatment with no increase of pain or discomfort. Patient was able to tolerate increase of weights while maintaining proper form with no noted shoulder/scapular compensations. Patient stated he has seen an improvement with functional activities and ADLs. No adverse affects noted upon removal of modalities.     Rehab Potential  Good    Clinical Impairments Affecting Rehab Potential  surgery 10/21/17     PT Frequency  2x / week    PT Treatment/Interventions  Moist Heat;Functional mobility training;Neuromuscular re-education;Therapeutic exercise;Therapeutic activities;Patient/family education;Manual techniques;Scar mobilization;Manual lymph drainage;Taping;ADLs/Self Care Home Management;Electrical Stimulation;Passive range of  motion    PT Next Visit Plan  Continue POC to improve strength for functional activities. May add RW4 next treatment per MPT    Consulted and  Agree with Plan of Care  Patient       Patient will benefit from skilled therapeutic intervention in order to improve the following deficits and impairments:  Decreased range of motion, Pain, Impaired UE functional use, Postural dysfunction  Visit Diagnosis: Acute pain of right shoulder  Stiffness of right shoulder, not elsewhere classified  Muscle weakness (generalized)     Problem List Patient Active Problem List   Diagnosis Date Noted  . Hydrocele sac 01/06/2016  . Varicocele 01/06/2016  . Erectile dysfunction of organic origin 12/20/2015  . BPH with obstruction/lower urinary tract symptoms 12/20/2015  . History of hypogonadism 12/20/2015  . History of nephrolithiasis 12/20/2015  . Neuropathy 11/20/2015  . GERD (gastroesophageal reflux disease)   . Chronic back pain   . Hypertension   . Allergic rhinitis 07/25/2013  . Insomnia 07/25/2013  . Diabetes ([place]) 03/16/2013  . Hyperlipidemia with target LDL less than 100 03/16/2013  . Depression 03/16/2013  . Lumbar stenosis with neurogenic claudication 12/26/2012   Gabriela Eves, PT, DPT 01/09/2018, 11:41 AM  [facility] [address] [place], [place], [address] Phone: [phone]   Fax:  [phone]  Name: [name] MRN: [id number] Date of Birth: 1961/01/19

## 2018-01-09 NOTE — Telephone Encounter (Signed)
Last seen 10/17/17   MMM

## 2018-01-11 ENCOUNTER — Encounter: Payer: 59 | Admitting: Physical Therapy

## 2018-01-12 ENCOUNTER — Encounter: Payer: Self-pay | Admitting: Physical Therapy

## 2018-01-12 ENCOUNTER — Ambulatory Visit: Payer: PPO | Admitting: Physical Therapy

## 2018-01-12 DIAGNOSIS — M25611 Stiffness of right shoulder, not elsewhere classified: Secondary | ICD-10-CM

## 2018-01-12 DIAGNOSIS — M25511 Pain in right shoulder: Secondary | ICD-10-CM

## 2018-01-12 DIAGNOSIS — M6281 Muscle weakness (generalized): Secondary | ICD-10-CM

## 2018-01-12 NOTE — Therapy (Signed)
[facility] [place], [place], [address] Phone: [phone]   Fax:  [phone]  Physical Therapy Treatment  Patient Details  Name: [name] MRN: [id number] Date of Birth: 1961/09/18 Referring Provider: Elsie Saas, MD   Encounter Date: 01/12/2018  PT End of Session - 01/12/18 1051    Visit Number  18    Number of Visits  22    Date for PT Re-Evaluation  01/19/18    PT Start Time  1031    PT Stop Time  1127    PT Time Calculation (min)  56 min    Activity Tolerance  Patient tolerated treatment well    Behavior During Therapy  [facility] for tasks assessed/performed       Past Medical History:  Diagnosis Date  . Arthritis   . Chronic back pain    stenosis  . Constipation    with pain meds  . Depression    takes Zoloft daily  . Diabetes mellitus without complication ([place])    takes Metformni daily  . Dizziness   . GERD (gastroesophageal reflux disease)    takes Nexium daily  . Headache(784.0)    occasionally  . Hyperlipidemia    takes Zetia daily  . Hypertension    takes Benicar daily  . Impingement syndrome of left shoulder   . Insomnia    takes Ambien nightly  . Joint pain   . Left rotator cuff tear   . Pneumonia 1986   walking  . Seasonal allergies    takes Claritin daily  . Tear of medial meniscus of right knee   . Urinary frequency    takes Flomax daily  . Urinary urgency     Past Surgical History:  Procedure Laterality Date  . BACK SURGERY  [id number]   lumb fusion  . COLONOSCOPY    . KNEE ARTHROSCOPY WITH MEDIAL MENISECTOMY Right 09/26/2013   Procedure: RIGHT KNEE ARTHROSCOPY WITH PARTIAL MEDIAL and lateral chrondroplasty MENISECTOMY;  Surgeon: Lorn Junes, MD;  Location: [place];  Service: Orthopedics;  Laterality: Right;  . left elbow surgery    . LITHOTRIPSY    . NASAL SEPTUM SURGERY    . removal of kidney stones  1981   lt open removal  . SHOULDER ARTHROSCOPY  WITH ROTATOR CUFF REPAIR AND SUBACROMIAL DECOMPRESSION Left 09/26/2013   Procedure: LEFT SHOULDER ARTHROSCOPY WITH DEBRIDEMENT, PARTIAL ROTATOR CUFF REPAIR AND SUBACROMIAL DECOMPRESSION AND SAD ACD DISTAL CLAVICULECTOMY;  Surgeon: Lorn Junes, MD;  Location: [place];  Service: Orthopedics;  Laterality: Left;  . TONSILLECTOMY      There were no vitals filed for this visit.  Subjective Assessment - 01/12/18 1032    Subjective  Patient reported "shoulder feels fine." No pain noted upon start of physical therapy.    Limitations  House hold activities;Lifting    Patient Stated Goals  Use my arm again    Currently in Pain?  No/denies                      [facility] Adult PT Treatment/Exercise - 01/12/18 0001      Shoulder Exercises: Prone   Retraction  Strengthening;Right;Weights;Limitations kneeling    Retraction Weight (lbs)  2    Retraction Limitations  2x15    Extension  Strengthening;Right;Weights;Limitations kneeling    Extension Weight (lbs)  2    Extension Limitations  2x15      Shoulder Exercises: Standing   Protraction  Strengthening;Right;Theraband;Limitations    Theraband Level (Shoulder Protraction)  Level 2 (Red)    Protraction Limitations  3x10    External Rotation  Strengthening;Right;Theraband;Limitations    Theraband Level (Shoulder External Rotation)  Level 2 (Red)    External Rotation Limitations  3x10    Internal Rotation  Strengthening;Right;Theraband;Limitations    Theraband Level (Shoulder Internal Rotation)  Level 2 (Red)    Internal Rotation Limitations  3x10    Flexion  Strengthening;Right;Limitations;Weights    Shoulder Flexion Weight (lbs)  2    Flexion Limitations  2x15    ABduction  Strengthening;Right;Weights    Shoulder ABduction Weight (lbs)  2    ABduction Limitations  2x15 in scaption    Row  Strengthening;Right;Theraband;Other (comment);Limitations    Theraband Level (Shoulder Row)  Level 2 (Red)    Row  Limitations  3x10      Shoulder Exercises: ROM/Strengthening   UBE (Upper Arm Bike)  8 minutes (4 min forward/ 4 min backward) at 90 RPM's.      Acupuncturist Location  Right shoulder.    Chartered certified accountant  IF[employer]    Electrical Stimulation Parameters  80-'150hz'  x110mn    Electrical Stimulation Goals  Other (comment) per requested after fatigue muscle      Vasopneumatic   Number Minutes Vasopneumatic   15 minutes    Vasopnuematic Location   Shoulder    Vasopneumatic Pressure  Medium per requested             PT Education - 01/12/18 1056    Education provided  Yes    Education Details  HEP    Person(s) Educated  Patient    Methods  Explanation;Demonstration;Handout    Comprehension  Verbalized understanding;Returned demonstration          PT Long Term Goals - 01/12/18 1052      PT LONG TERM GOAL #1   Title  Pt will be independent in his HEP and progression    Time  8    Period  Weeks    Status  Achieved 01/12/18      PT LONG TERM GOAL #2   Title  Pt will be able to increase his R shoulder flexion to >/= 150 degrees actively in order to improve functional mobility.     Time  8    Period  Weeks    Status  Achieved      PT LONG TERM GOAL #3   Title  Pt will be able to increase his R ER to >/= 65 degrees in order to improve functional mobilty.     Time  8    Period  Weeks    Status  Achieved      PT LONG TERM GOAL #4   Title  pt will be able to report pain of </= 2/10 with ADL's.     Time  8    Period  Weeks    Status  Achieved 01/12/18            Plan - 01/12/18 1059    Clinical Impression Statement  Patient tolerated treatment very well with no pain reported, good technique with verbal and visual education and only minimal muscle fatigue. Patient improved FOTO to 96% limitation initial to 29% currently. Patient has reported he lifted over 10# not thinking about it and swung golf club and reported no pain.  Patient also reported no pain or difficulty with ADL's. Educated patient on current protocol and progress.  HEP given today for progression. Patient has met all current goals yet would like to cintinue until next visit with MD for strengthening. Follow up sent to PT to consiter new goal per discression.     Rehab Potential  Good    Clinical Impairments Affecting Rehab Potential  surgery 10/21/17 current 12 weeks 01/13/18 / FOTO 18th visit 29% improved (initial 96%)    PT Frequency  2x / week    PT Treatment/Interventions  Moist Heat;Functional mobility training;Neuromuscular re-education;Therapeutic exercise;Therapeutic activities;Patient/family education;Manual techniques;Scar mobilization;Manual lymph drainage;Taping;ADLs/Self Care Home Management;Electrical Stimulation;Passive range of motion    PT Next Visit Plan  Continue POC to improve strength for functional activities / follow up sent to PT for re-cert and added goal per discreesion    Consulted and Agree with Plan of Care  Patient       Patient will benefit from skilled therapeutic intervention in order to improve the following deficits and impairments:  Decreased range of motion, Pain, Impaired UE functional use, Postural dysfunction  Visit Diagnosis: Acute pain of right shoulder  Stiffness of right shoulder, not elsewhere classified  Muscle weakness (generalized)     Problem List Patient Active Problem List   Diagnosis Date Noted  . Hydrocele sac 01/06/2016  . Varicocele 01/06/2016  . Erectile dysfunction of organic origin 12/20/2015  . BPH with obstruction/lower urinary tract symptoms 12/20/2015  . History of hypogonadism 12/20/2015  . History of nephrolithiasis 12/20/2015  . Neuropathy 11/20/2015  . GERD (gastroesophageal reflux disease)   . Chronic back pain   . Hypertension   . Allergic rhinitis 07/25/2013  . Insomnia 07/25/2013  . Diabetes ([place]) 03/16/2013  . Hyperlipidemia with target LDL less than 100 03/16/2013   . Depression 03/16/2013  . Lumbar stenosis with neurogenic claudication 12/26/2012    Ladean Raya, PTA 01/12/18 11:31 AM  [facility] [place], [place], [address] Phone: [phone]   Fax:  [phone]  Name: [name] MRN: [id number] Date of Birth: 1961-11-26

## 2018-01-12 NOTE — Patient Instructions (Signed)
  Strengthening: Resisted Flexion   Hold tubing with right arm at side. Pull forward and up. Move shoulder through pain-free range of motion. Repeat __10__ times per set. Do _2-3___ sets per session. Do _2-3___ sessions per day.  Strengthening: Resisted Extension   Hold tubing in right hand, arm forward. Pull arm back, elbow straight. Repeat __10__ times per set. Do _2-3___ sets per session. Do _2-3___ sessions per day.   Strengthening: Resisted Internal Rotation   Hold tubing in right hand, elbow at side and forearm out. Rotate forearm in across body. Repeat __10__ times per set. Do _2-3___ sets per session. Do _2-3___ sessions per day.   Strengthening: Resisted External Rotation   Hold tubing in right hand, elbow at side and forearm across body. Rotate forearm out. Repeat __10__ times per set. Do __2-3__ sets per session. Do ____ sessions per day.   

## 2018-01-16 ENCOUNTER — Ambulatory Visit: Payer: PPO | Attending: Orthopedic Surgery | Admitting: Physical Therapy

## 2018-01-16 DIAGNOSIS — M25511 Pain in right shoulder: Secondary | ICD-10-CM

## 2018-01-16 DIAGNOSIS — M791 Myalgia, unspecified site: Secondary | ICD-10-CM | POA: Diagnosis not present

## 2018-01-16 DIAGNOSIS — M25611 Stiffness of right shoulder, not elsewhere classified: Secondary | ICD-10-CM | POA: Diagnosis not present

## 2018-01-16 DIAGNOSIS — R2689 Other abnormalities of gait and mobility: Secondary | ICD-10-CM | POA: Diagnosis not present

## 2018-01-16 DIAGNOSIS — M6281 Muscle weakness (generalized): Secondary | ICD-10-CM | POA: Insufficient documentation

## 2018-01-16 NOTE — Therapy (Signed)
[facility] [place], [place], [address] Phone: [phone]   Fax:  [phone]  Physical Therapy Treatment  Patient Details  Name: [name] MRN: [id number] Date of Birth: Sep 30, 1961 Referring Provider: Elsie Saas, MD   Encounter Date: 01/16/2018  PT End of Session - 01/16/18 1116    Visit Number  19    Number of Visits  26    Date for PT Re-Evaluation  02/16/18    Authorization Type  g -code every 10th visit    PT Start Time  1115    PT Stop Time  1208    PT Time Calculation (min)  53 min    Activity Tolerance  Patient tolerated treatment well    Behavior During Therapy  [facility] for tasks assessed/performed       Past Medical History:  Diagnosis Date  . Arthritis   . Chronic back pain    stenosis  . Constipation    with pain meds  . Depression    takes Zoloft daily  . Diabetes mellitus without complication ([place])    takes Metformni daily  . Dizziness   . GERD (gastroesophageal reflux disease)    takes Nexium daily  . Headache(784.0)    occasionally  . Hyperlipidemia    takes Zetia daily  . Hypertension    takes Benicar daily  . Impingement syndrome of left shoulder   . Insomnia    takes Ambien nightly  . Joint pain   . Left rotator cuff tear   . Pneumonia 1986   walking  . Seasonal allergies    takes Claritin daily  . Tear of medial meniscus of right knee   . Urinary frequency    takes Flomax daily  . Urinary urgency     Past Surgical History:  Procedure Laterality Date  . BACK SURGERY  [id number]   lumb fusion  . COLONOSCOPY    . KNEE ARTHROSCOPY WITH MEDIAL MENISECTOMY Right 09/26/2013   Procedure: RIGHT KNEE ARTHROSCOPY WITH PARTIAL MEDIAL and lateral chrondroplasty MENISECTOMY;  Surgeon: Lorn Junes, MD;  Location: [place];  Service: Orthopedics;  Laterality: Right;  . left elbow surgery    . LITHOTRIPSY    . NASAL SEPTUM SURGERY    . removal of kidney stones   1981   lt open removal  . SHOULDER ARTHROSCOPY WITH ROTATOR CUFF REPAIR AND SUBACROMIAL DECOMPRESSION Left 09/26/2013   Procedure: LEFT SHOULDER ARTHROSCOPY WITH DEBRIDEMENT, PARTIAL ROTATOR CUFF REPAIR AND SUBACROMIAL DECOMPRESSION AND SAD ACD DISTAL CLAVICULECTOMY;  Surgeon: Lorn Junes, MD;  Location: [place];  Service: Orthopedics;  Laterality: Left;  . TONSILLECTOMY      There were no vitals filed for this visit.  Subjective Assessment - 01/16/18 1116    Subjective  Patient overused his shoulder playing video games for 4 hours with his grandson and was really hurting yesterday. Took pain pill this am and not bad now.    Patient Stated Goals  Use my arm again    Currently in Pain?  Yes    Pain Score  2     Pain Location  Shoulder    Pain Orientation  Right    Pain Descriptors / Indicators  Sore    Pain Type  Surgical pain                      [facility] Adult PT Treatment/Exercise - 01/16/18 0001      Shoulder  Exercises: Prone   Extension  Strengthening;Right;10 reps;20 reps;Weights    Extension Weight (lbs)  2    Horizontal ABduction 1  Strengthening;Right;10 reps;20 reps;Weights 1#      Shoulder Exercises: Standing   Protraction  Strengthening;Right;Theraband;Limitations;10 reps;20 reps    Theraband Level (Shoulder Protraction)  Level 3 (Green)    External Rotation  Strengthening;Right;10 reps;20 reps;Theraband    Theraband Level (Shoulder External Rotation)  Level 3 (Green)    Internal Rotation  Strengthening;Right;Theraband;Limitations;10 reps;20 reps    Theraband Level (Shoulder Internal Rotation)  Level 3 (Green)    Flexion  Strengthening;Right;5 reps;20 reps;Theraband    Theraband Level (Shoulder Flexion)  Level 3 (Green)    Shoulder Flexion Weight (lbs)  2    Flexion Limitations  3x10    ABduction  Strengthening;10 reps;Weights 3 sets    Shoulder ABduction Weight (lbs)  2    ABduction Limitations  3x10 scaption 2#    Row   Strengthening;Right;10 reps;20 reps;Theraband    Theraband Level (Shoulder Row)  Level 3 (Green)      Shoulder Exercises: ROM/Strengthening   UBE (Upper Arm Bike)  8 minutes (4 min forward/ 4 min backward) at 90 RPM's.      Modalities   Modalities  Psychologist, educational Location  R shoulder premod 80-150 Hz x 15 min    Electrical Stimulation Goals  Pain      Vasopneumatic   Number Minutes Vasopneumatic   15 minutes    Vasopnuematic Location   Shoulder    Vasopneumatic Pressure  Medium    Vasopneumatic Temperature   34                  PT Long Term Goals - 01/16/18 1159      PT LONG TERM GOAL #5   Title  Pt to demonstrate 4+/5 strength in the R shoulder to normalize ADLS.    Time  4    Period  Weeks    Status  New      PT LONG TERM GOAL #6   Title  -----------------------------------------            Plan - 01/16/18 1156    Clinical Impression Statement  Patient continues to improve with strenghening with no increase in pain reported. New strength goal added today.    PT Treatment/Interventions  Moist Heat;Functional mobility training;Neuromuscular re-education;Therapeutic exercise;Therapeutic activities;Patient/family education;Manual techniques;Scar mobilization;Manual lymph drainage;Taping;ADLs/Self Care Home Management;Electrical Stimulation;Passive range of motion    PT Next Visit Plan  FOTO for 20th visit; Continue POC to improve strength for functional activities / follow up sent to PT for re-cert and added goal per discreesion       Patient will benefit from skilled therapeutic intervention in order to improve the following deficits and impairments:  Decreased range of motion, Pain, Impaired UE functional use, Postural dysfunction  Visit Diagnosis: Muscle weakness (generalized)  Acute pain of right shoulder     Problem List Patient Active Problem List   Diagnosis Date Noted   . Hydrocele sac 01/06/2016  . Varicocele 01/06/2016  . Erectile dysfunction of organic origin 12/20/2015  . BPH with obstruction/lower urinary tract symptoms 12/20/2015  . History of hypogonadism 12/20/2015  . History of nephrolithiasis 12/20/2015  . Neuropathy 11/20/2015  . GERD (gastroesophageal reflux disease)   . Chronic back pain   . Hypertension   . Allergic rhinitis 07/25/2013  . Insomnia 07/25/2013  . Diabetes ([place]) 03/16/2013  .  Hyperlipidemia with target LDL less than 100 03/16/2013  . Depression 03/16/2013  . Lumbar stenosis with neurogenic claudication 12/26/2012    Madelyn Flavors PT 01/16/2018, 12:04 PM  [facility] [address] [place], [place], [address] Phone: [phone]   Fax:  [phone]  Name: [name] MRN: [id number] Date of Birth: 19-Aug-1961

## 2018-01-18 ENCOUNTER — Ambulatory Visit: Payer: PPO | Admitting: Physical Therapy

## 2018-01-18 ENCOUNTER — Encounter: Payer: Self-pay | Admitting: Physical Therapy

## 2018-01-18 DIAGNOSIS — M6281 Muscle weakness (generalized): Secondary | ICD-10-CM | POA: Diagnosis not present

## 2018-01-18 DIAGNOSIS — M25511 Pain in right shoulder: Secondary | ICD-10-CM

## 2018-01-18 DIAGNOSIS — M25611 Stiffness of right shoulder, not elsewhere classified: Secondary | ICD-10-CM

## 2018-01-18 NOTE — Therapy (Signed)
[facility] [place], [place], [address] Phone: [phone]   Fax:  [phone]  Physical Therapy Treatment  Patient Details  Name: [name] MRN: [id number] Date of Birth: [birth year] Referring Provider: Elsie Saas, MD   Encounter Date: 01/18/2018  PT End of Session - 01/18/18 1146    Visit Number  20    Number of Visits  26    Date for PT Re-Evaluation  02/16/18    PT Start Time  1114    PT Stop Time  1159    PT Time Calculation (min)  45 min    Activity Tolerance  Patient tolerated treatment well    Behavior During Therapy  [facility] for tasks assessed/performed       Past Medical History:  Diagnosis Date  . Arthritis   . Chronic back pain    stenosis  . Constipation    with pain meds  . Depression    takes Zoloft daily  . Diabetes mellitus without complication ([place])    takes Metformni daily  . Dizziness   . GERD (gastroesophageal reflux disease)    takes Nexium daily  . Headache(784.0)    occasionally  . Hyperlipidemia    takes Zetia daily  . Hypertension    takes Benicar daily  . Impingement syndrome of left shoulder   . Insomnia    takes Ambien nightly  . Joint pain   . Left rotator cuff tear   . Pneumonia 1986   walking  . Seasonal allergies    takes Claritin daily  . Tear of medial meniscus of right knee   . Urinary frequency    takes Flomax daily  . Urinary urgency     Past Surgical History:  Procedure Laterality Date  . BACK SURGERY  [id number]   lumb fusion  . COLONOSCOPY    . KNEE ARTHROSCOPY WITH MEDIAL MENISECTOMY Right 09/26/2013   Procedure: RIGHT KNEE ARTHROSCOPY WITH PARTIAL MEDIAL and lateral chrondroplasty MENISECTOMY;  Surgeon: Lorn Junes, MD;  Location: [facility];  Service: Orthopedics;  Laterality: Right;  . left elbow surgery    . LITHOTRIPSY    . NASAL SEPTUM SURGERY    . removal of kidney stones  1981   lt open removal  . SHOULDER ARTHROSCOPY  WITH ROTATOR CUFF REPAIR AND SUBACROMIAL DECOMPRESSION Left 09/26/2013   Procedure: LEFT SHOULDER ARTHROSCOPY WITH DEBRIDEMENT, PARTIAL ROTATOR CUFF REPAIR AND SUBACROMIAL DECOMPRESSION AND SAD ACD DISTAL CLAVICULECTOMY;  Surgeon: Lorn Junes, MD;  Location: [facility];  Service: Orthopedics;  Laterality: Left;  . TONSILLECTOMY      There were no vitals filed for this visit.  Subjective Assessment - 01/18/18 1124    Subjective  Patient reported doing yard work yesterday which caused soreness today    Limitations  House hold activities;Lifting    Patient Stated Goals  Use my arm again    Currently in Pain?  Yes    Pain Score  2     Pain Location  Shoulder    Pain Orientation  Right    Pain Descriptors / Indicators  Sore    Pain Type  Surgical pain    Pain Onset  More than a month ago    Pain Frequency  Intermittent    Aggravating Factors   increased activity    Pain Relieving Factors  rest and meds  [facility] Adult PT Treatment/Exercise - 01/18/18 0001      Shoulder Exercises: Supine   Protraction  Strengthening;Right;20 reps;Weights    Protraction Weight (lbs)  2      Shoulder Exercises: Prone   Extension  Strengthening;Right;10 reps;20 reps;Weights    Extension Weight (lbs)  2    Horizontal ABduction 1  Strengthening;Right;10 reps;20 reps;Weights    Horizontal ABduction 1 Weight (lbs)  1      Shoulder Exercises: Standing   Protraction  Strengthening;Right;Theraband;Limitations;10 reps;20 reps    Theraband Level (Shoulder Protraction)  Level 3 (Green)    External Rotation  Strengthening;Right;10 reps;20 reps;Theraband    Theraband Level (Shoulder External Rotation)  Level 3 (Green)    Internal Rotation  Strengthening;Right;Theraband;Limitations;10 reps;20 reps    Flexion  Strengthening;Right    Shoulder Flexion Weight (lbs)  2    Flexion Limitations  3x10    ABduction Limitations  3x10 scaption 2#    Row   Strengthening;Right;10 reps;20 reps;Theraband    Theraband Level (Shoulder Row)  Level 3 (Green)      Shoulder Exercises: ROM/Strengthening   UBE (Upper Arm Bike)  8 minutes (4 min forward/ 4 min backward) at 90 RPM's.    Wall Pushups  20 reps;Limitations    Wall Pushups Limitations  standing      Electrical Stimulation   Electrical Stimulation Location  R shoulder premod 80-150 Hz x 15 min    Electrical Stimulation Goals  Pain      Vasopneumatic   Number Minutes Vasopneumatic   15 minutes    Vasopnuematic Location   Shoulder    Vasopneumatic Pressure  Medium                  PT Long Term Goals - 01/16/18 1159      PT LONG TERM GOAL #5   Title  Pt to demonstrate 4+/5 strength in the R shoulder to normalize ADLS.    Time  4    Period  Weeks    Status  New      PT LONG TERM GOAL #6   Title  -----------------------------------------            Plan - 01/18/18 1147    Clinical Impression Statement  Patient tolerated treatment well today. Patient able to progress right shoulder strengthening exercises with good technique and no increased discomfort. Patient progressing toward goals yet ongoing due to strength deficts. Today reviewed protocol with patient to educate on current status of shoulder progress and limitations.     Rehab Potential  Good    Clinical Impairments Affecting Rehab Potential  surgery 10/21/17 current 12 weeks 01/13/18 / FOTO 120h visit 31% improved (initial 96%)    PT Frequency  2x / week    PT Treatment/Interventions  Moist Heat;Functional mobility training;Neuromuscular re-education;Therapeutic exercise;Therapeutic activities;Patient/family education;Manual techniques;Scar mobilization;Manual lymph drainage;Taping;ADLs/Self Care Home Management;Electrical Stimulation;Passive range of motion    PT Next Visit Plan  Continue POC to improve strength for functional activities per protocol    Consulted and Agree with Plan of Care  Patient        Patient will benefit from skilled therapeutic intervention in order to improve the following deficits and impairments:  Decreased range of motion, Pain, Impaired UE functional use, Postural dysfunction  Visit Diagnosis: Muscle weakness (generalized)  Acute pain of right shoulder  Stiffness of right shoulder, not elsewhere classified     Problem List Patient Active Problem List   Diagnosis Date Noted  . Hydrocele sac 01/06/2016  .  Varicocele 01/06/2016  . Erectile dysfunction of organic origin 12/20/2015  . BPH with obstruction/lower urinary tract symptoms 12/20/2015  . History of hypogonadism 12/20/2015  . History of nephrolithiasis 12/20/2015  . Neuropathy 11/20/2015  . GERD (gastroesophageal reflux disease)   . Chronic back pain   . Hypertension   . Allergic rhinitis 07/25/2013  . Insomnia 07/25/2013  . Diabetes ([place]) 03/16/2013  . Hyperlipidemia with target LDL less than 100 03/16/2013  . Depression 03/16/2013  . Lumbar stenosis with neurogenic claudication 12/26/2012    Phillips Climes, PTA 01/18/2018, 12:01 PM  [facility] [address] [place], [place], [address] Phone: [phone]   Fax:  [phone]  Name: ELPIDIO THIELEN MRN: [id number] Date of Birth: October 26, 1961

## 2018-01-19 ENCOUNTER — Ambulatory Visit: Payer: PPO | Admitting: Nurse Practitioner

## 2018-01-24 ENCOUNTER — Ambulatory Visit: Payer: PPO | Admitting: *Deleted

## 2018-01-24 DIAGNOSIS — M6281 Muscle weakness (generalized): Secondary | ICD-10-CM | POA: Diagnosis not present

## 2018-01-24 DIAGNOSIS — R2689 Other abnormalities of gait and mobility: Secondary | ICD-10-CM

## 2018-01-24 DIAGNOSIS — M25611 Stiffness of right shoulder, not elsewhere classified: Secondary | ICD-10-CM

## 2018-01-24 DIAGNOSIS — M25511 Pain in right shoulder: Secondary | ICD-10-CM

## 2018-01-24 NOTE — Therapy (Signed)
[facility] [place], [place], [address] Phone: [phone]   Fax:  [phone]  Physical Therapy Treatment  Patient Details  Name: [name] MRN: [id number] Date of Birth: 02/07/1961 Referring Provider: Elsie Saas, MD   Encounter Date: 01/24/2018  PT End of Session - 01/24/18 1128    Visit Number  21    Number of Visits  26    Date for PT Re-Evaluation  02/16/18    Authorization Type  g -code every 10th visit    PT Start Time  1030    PT Stop Time  1128    PT Time Calculation (min)  58 min    Activity Tolerance  Patient tolerated treatment well    Behavior During Therapy  [facility] for tasks assessed/performed       Past Medical History:  Diagnosis Date  . Arthritis   . Chronic back pain    stenosis  . Constipation    with pain meds  . Depression    takes Zoloft daily  . Diabetes mellitus without complication ([place])    takes Metformni daily  . Dizziness   . GERD (gastroesophageal reflux disease)    takes Nexium daily  . Headache(784.0)    occasionally  . Hyperlipidemia    takes Zetia daily  . Hypertension    takes Benicar daily  . Impingement syndrome of left shoulder   . Insomnia    takes Ambien nightly  . Joint pain   . Left rotator cuff tear   . Pneumonia 1986   walking  . Seasonal allergies    takes Claritin daily  . Tear of medial meniscus of right knee   . Urinary frequency    takes Flomax daily  . Urinary urgency     Past Surgical History:  Procedure Laterality Date  . BACK SURGERY  [id number]   lumb fusion  . COLONOSCOPY    . KNEE ARTHROSCOPY WITH MEDIAL MENISECTOMY Right 09/26/2013   Procedure: RIGHT KNEE ARTHROSCOPY WITH PARTIAL MEDIAL and lateral chrondroplasty MENISECTOMY;  Surgeon: Lorn Junes, MD;  Location: [facility];  Service: Orthopedics;  Laterality: Right;  . left elbow surgery    . LITHOTRIPSY    . NASAL SEPTUM SURGERY    . removal of kidney stones   1981   lt open removal  . SHOULDER ARTHROSCOPY WITH ROTATOR CUFF REPAIR AND SUBACROMIAL DECOMPRESSION Left 09/26/2013   Procedure: LEFT SHOULDER ARTHROSCOPY WITH DEBRIDEMENT, PARTIAL ROTATOR CUFF REPAIR AND SUBACROMIAL DECOMPRESSION AND SAD ACD DISTAL CLAVICULECTOMY;  Surgeon: Lorn Junes, MD;  Location: [facility];  Service: Orthopedics;  Laterality: Left;  . TONSILLECTOMY      There were no vitals filed for this visit.  Subjective Assessment - 01/24/18 1040    Subjective  RT shldr sore , but doing well    Limitations  House hold activities;Lifting    Patient Stated Goals  Use my arm again    Currently in Pain?  Yes    Pain Score  2     Pain Location  Shoulder    Pain Orientation  Right    Pain Descriptors / Indicators  Sore    Pain Type  Surgical pain    Pain Onset  More than a month ago    Pain Frequency  Intermittent                      [facility] Adult PT Treatment/Exercise - 01/24/18 0001  Shoulder Exercises: Prone   Retraction  Right;10 reps;20 reps Row 3x10    Extension  Strengthening;Right;10 reps;20 reps;Weights 3x10    Extension Weight (lbs)  2    Horizontal ABduction 1  Strengthening;Right;10 reps;20 reps;Weights 3x10    Horizontal ABduction 1 Weight (lbs)  1      Shoulder Exercises: Standing   External Rotation  Strengthening;Right;10 reps;20 reps;Theraband    Theraband Level (Shoulder External Rotation)  Level 2 (Red)    Internal Rotation  Strengthening;Right;Theraband;Limitations;10 reps;20 reps    Theraband Level (Shoulder Internal Rotation)  Level 3 (Green)    Shoulder Flexion Weight (lbs)  2    Flexion Limitations  3x10    ABduction Limitations  3x10 scaption 2#    Row  Strengthening;Right;10 reps;20 reps;Theraband    Theraband Level (Shoulder Row)  Level 3 (Green)      Shoulder Exercises: ROM/Strengthening   UBE (Upper Arm Bike)  8 minutes (4 min forward/ 4 min backward) at 90 RPM's.    Wall Pushups  20 reps;Limitations     Wall Pushups Limitations  standing      Modalities   Modalities  Electrical Stimulation;Vasopneumatic      Electrical Stimulation   Electrical Stimulation Location  R shoulder premod 80-150 Hz x 15 min    Electrical Stimulation Goals  Pain      Vasopneumatic   Number Minutes Vasopneumatic   15 minutes    Vasopnuematic Location   Shoulder    Vasopneumatic Pressure  Medium    Vasopneumatic Temperature   34                  PT Long Term Goals - 01/16/18 1159      PT LONG TERM GOAL #5   Title  Pt to demonstrate 4+/5 strength in the R shoulder to normalize ADLS.    Time  4    Period  Weeks    Status  New      PT LONG TERM GOAL #6   Title  -----------------------------------------            Plan - 01/24/18 1257    Clinical Impression Statement  Pt arrived today doing fairly well with mainly soreness in RT shldr. He was able to perform light RT shldr strengthening with mainly fatigue. Normal modality response upon removal of modalities.    Clinical Presentation  Stable    Rehab Potential  Good    Clinical Impairments Affecting Rehab Potential  surgery 10/21/17 current 12 weeks 01/13/18 / FOTO 120h visit 31% improved (initial 96%)    PT Frequency  2x / week    PT Treatment/Interventions  Moist Heat;Functional mobility training;Neuromuscular re-education;Therapeutic exercise;Therapeutic activities;Patient/family education;Manual techniques;Scar mobilization;Manual lymph drainage;Taping;ADLs/Self Care Home Management;Electrical Stimulation;Passive range of motion    PT Next Visit Plan  Continue POC to improve strength for functional activities per protocol    PT Home Exercise Plan  issue HEP       Patient will benefit from skilled therapeutic intervention in order to improve the following deficits and impairments:  Decreased range of motion, Pain, Impaired UE functional use, Postural dysfunction  Visit Diagnosis: Muscle weakness (generalized)  Acute pain of  right shoulder  Stiffness of right shoulder, not elsewhere classified  Other abnormalities of gait and mobility     Problem List Patient Active Problem List   Diagnosis Date Noted  . Hydrocele sac 01/06/2016  . Varicocele 01/06/2016  . Erectile dysfunction of organic origin 12/20/2015  . BPH with obstruction/lower urinary  tract symptoms 12/20/2015  . History of hypogonadism 12/20/2015  . History of nephrolithiasis 12/20/2015  . Neuropathy 11/20/2015  . GERD (gastroesophageal reflux disease)   . Chronic back pain   . Hypertension   . Allergic rhinitis 07/25/2013  . Insomnia 07/25/2013  . Diabetes ([place]) 03/16/2013  . Hyperlipidemia with target LDL less than 100 03/16/2013  . Depression 03/16/2013  . Lumbar stenosis with neurogenic claudication 12/26/2012    Embree Brawley,CHRIS, PTA 01/24/2018, 1:09 PM  [facility] [place], [place], [address] Phone: [phone]   Fax:  [phone]  Name: [name] MRN: [id number] Date of Birth: March 02, 1961

## 2018-01-26 ENCOUNTER — Encounter: Payer: 59 | Admitting: Physical Therapy

## 2018-01-26 DIAGNOSIS — M19011 Primary osteoarthritis, right shoulder: Secondary | ICD-10-CM | POA: Diagnosis not present

## 2018-01-31 ENCOUNTER — Ambulatory Visit: Payer: PPO | Admitting: *Deleted

## 2018-01-31 DIAGNOSIS — M25611 Stiffness of right shoulder, not elsewhere classified: Secondary | ICD-10-CM

## 2018-01-31 DIAGNOSIS — R2689 Other abnormalities of gait and mobility: Secondary | ICD-10-CM

## 2018-01-31 DIAGNOSIS — M6281 Muscle weakness (generalized): Secondary | ICD-10-CM

## 2018-01-31 DIAGNOSIS — M25511 Pain in right shoulder: Secondary | ICD-10-CM

## 2018-01-31 DIAGNOSIS — M791 Myalgia, unspecified site: Secondary | ICD-10-CM

## 2018-01-31 NOTE — Therapy (Signed)
[facility] [place], [place], [address] Phone: [phone]   Fax:  [phone]  Physical Therapy Treatment  Patient Details  Name: [name] MRN: [id number] Date of Birth: [birth year] Referring Provider: Elsie Saas, MD   Encounter Date: 01/31/2018  PT End of Session - 01/31/18 1252    Visit Number  22    Number of Visits  26    Date for PT Re-Evaluation  02/16/18    Authorization Type  g -code every 10th visit    PT Start Time  1115    PT Stop Time  1205    PT Time Calculation (min)  50 min       Past Medical History:  Diagnosis Date  . Arthritis   . Chronic back pain    stenosis  . Constipation    with pain meds  . Depression    takes Zoloft daily  . Diabetes mellitus without complication ([place])    takes Metformni daily  . Dizziness   . GERD (gastroesophageal reflux disease)    takes Nexium daily  . Headache(784.0)    occasionally  . Hyperlipidemia    takes Zetia daily  . Hypertension    takes Benicar daily  . Impingement syndrome of left shoulder   . Insomnia    takes Ambien nightly  . Joint pain   . Left rotator cuff tear   . Pneumonia 1986   walking  . Seasonal allergies    takes Claritin daily  . Tear of medial meniscus of right knee   . Urinary frequency    takes Flomax daily  . Urinary urgency     Past Surgical History:  Procedure Laterality Date  . BACK SURGERY  [id number]   lumb fusion  . COLONOSCOPY    . KNEE ARTHROSCOPY WITH MEDIAL MENISECTOMY Right 09/26/2013   Procedure: RIGHT KNEE ARTHROSCOPY WITH PARTIAL MEDIAL and lateral chrondroplasty MENISECTOMY;  Surgeon: Lorn Junes, MD;  Location: [facility];  Service: Orthopedics;  Laterality: Right;  . left elbow surgery    . LITHOTRIPSY    . NASAL SEPTUM SURGERY    . removal of kidney stones  1981   lt open removal  . SHOULDER ARTHROSCOPY WITH ROTATOR CUFF REPAIR AND SUBACROMIAL DECOMPRESSION Left 09/26/2013    Procedure: LEFT SHOULDER ARTHROSCOPY WITH DEBRIDEMENT, PARTIAL ROTATOR CUFF REPAIR AND SUBACROMIAL DECOMPRESSION AND SAD ACD DISTAL CLAVICULECTOMY;  Surgeon: Lorn Junes, MD;  Location: [facility];  Service: Orthopedics;  Laterality: Left;  . TONSILLECTOMY      There were no vitals filed for this visit.  Subjective Assessment - 01/31/18 1131    Subjective  Went to MD last week and he was pleased. Cont. PT    Limitations  House hold activities;Lifting    Currently in Pain?  Yes    Pain Score  2     Pain Location  Shoulder    Pain Orientation  Right    Pain Descriptors / Indicators  Sore    Pain Onset  More than a month ago                      [facility] Adult PT Treatment/Exercise - 01/31/18 0001      Shoulder Exercises: Prone   Retraction  Right Row 4x10    Extension  Strengthening;Right;10 reps;20 reps;Weights 4x10    Extension Weight (lbs)  2      Shoulder Exercises: Standing   External  Rotation  Strengthening;Right;10 reps;20 reps;Theraband 4x10    Internal Rotation  Strengthening;Right;Theraband;Limitations;10 reps;20 reps    Theraband Level (Shoulder Internal Rotation)  Level 3 (Green)    Internal Rotation Limitations  4x10    Shoulder Flexion Weight (lbs)  2    Flexion Limitations  4x10    ABduction Limitations  4x10 scaption 2#      Shoulder Exercises: ROM/Strengthening   UBE (Upper Arm Bike)  8 minutes (4 min forward/ 4 min backward) at 90 RPM's.      Modalities   Modalities  Psychologist, educational Location  R shoulder premod 80-150 Hz x 15 min    Electrical Stimulation Goals  Pain      Vasopneumatic   Number Minutes Vasopneumatic   15 minutes    Vasopnuematic Location   Shoulder    Vasopneumatic Pressure  Medium    Vasopneumatic Temperature   34                  PT Long Term Goals - 01/16/18 1159      PT LONG TERM GOAL #5   Title  Pt to  demonstrate 4+/5 strength in the R shoulder to normalize ADLS.    Time  4    Period  Weeks    Status  New      PT LONG TERM GOAL #6   Title  -----------------------------------------            Plan - 01/31/18 1253    Clinical Impression Statement  Pt arrived today after MD F/U to continue with protocol strengthening. He did well with therex today with Wt remaining the same and sets increased to 4 sets of 10. Normal response to modalities    Clinical Presentation  Stable    Clinical Decision Making  Low    Rehab Potential  Good    Clinical Impairments Affecting Rehab Potential  surgery 10/21/17 current 12 weeks 01/13/18 / FOTO 120h visit 31% improved (initial 96%)    PT Frequency  2x / week    PT Treatment/Interventions  Moist Heat;Functional mobility training;Neuromuscular re-education;Therapeutic exercise;Therapeutic activities;Patient/family education;Manual techniques;Scar mobilization;Manual lymph drainage;Taping;ADLs/Self Care Home Management;Electrical Stimulation;Passive range of motion    PT Next Visit Plan  Continue POC to improve strength for functional activities per protocol    Consulted and Agree with Plan of Care  Patient       Patient will benefit from skilled therapeutic intervention in order to improve the following deficits and impairments:  Decreased range of motion, Pain, Impaired UE functional use, Postural dysfunction  Visit Diagnosis: Muscle weakness (generalized)  Acute pain of right shoulder  Stiffness of right shoulder, not elsewhere classified  Other abnormalities of gait and mobility  Myalgia     Problem List Patient Active Problem List   Diagnosis Date Noted  . Hydrocele sac 01/06/2016  . Varicocele 01/06/2016  . Erectile dysfunction of organic origin 12/20/2015  . BPH with obstruction/lower urinary tract symptoms 12/20/2015  . History of hypogonadism 12/20/2015  . History of nephrolithiasis 12/20/2015  . Neuropathy 11/20/2015  . GERD  (gastroesophageal reflux disease)   . Chronic back pain   . Hypertension   . Allergic rhinitis 07/25/2013  . Insomnia 07/25/2013  . Diabetes ([place]) 03/16/2013  . Hyperlipidemia with target LDL less than 100 03/16/2013  . Depression 03/16/2013  . Lumbar stenosis with neurogenic claudication 12/26/2012    Kaiyana Bedore,CHRIS, PTA 01/31/2018, 1:48 PM  [facility] [place], [place], [address] Phone: [phone]   Fax:  [phone]  Name: DENILSON SALMINEN MRN: [id number] Date of Birth: [birth year]

## 2018-02-02 ENCOUNTER — Encounter: Payer: Self-pay | Admitting: *Deleted

## 2018-02-02 ENCOUNTER — Ambulatory Visit: Payer: PPO | Admitting: *Deleted

## 2018-02-02 DIAGNOSIS — M25611 Stiffness of right shoulder, not elsewhere classified: Secondary | ICD-10-CM

## 2018-02-02 DIAGNOSIS — M6281 Muscle weakness (generalized): Secondary | ICD-10-CM | POA: Diagnosis not present

## 2018-02-02 DIAGNOSIS — M25511 Pain in right shoulder: Secondary | ICD-10-CM

## 2018-02-02 DIAGNOSIS — R2689 Other abnormalities of gait and mobility: Secondary | ICD-10-CM

## 2018-02-02 DIAGNOSIS — M791 Myalgia, unspecified site: Secondary | ICD-10-CM

## 2018-02-02 NOTE — Therapy (Signed)
[facility] [place], [place], [address] Phone: [phone]   Fax:  [phone]  Physical Therapy Treatment  Patient Details  Name: [name] MRN: [id number] Date of Birth: 1961-02-02 Referring Provider: Elsie Saas, MD   Encounter Date: 02/02/2018  PT End of Session - 02/02/18 1446    Visit Number  23    Number of Visits  26    Date for PT Re-Evaluation  02/16/18    Authorization Type  g -code every 10th visit    PT Start Time  1430    PT Stop Time  1530    PT Time Calculation (min)  60 min       Past Medical History:  Diagnosis Date  . Arthritis   . Chronic back pain    stenosis  . Constipation    with pain meds  . Depression    takes Zoloft daily  . Diabetes mellitus without complication ([place])    takes Metformni daily  . Dizziness   . GERD (gastroesophageal reflux disease)    takes Nexium daily  . Headache(784.0)    occasionally  . Hyperlipidemia    takes Zetia daily  . Hypertension    takes Benicar daily  . Impingement syndrome of left shoulder   . Insomnia    takes Ambien nightly  . Joint pain   . Left rotator cuff tear   . Pneumonia 1986   walking  . Seasonal allergies    takes Claritin daily  . Tear of medial meniscus of right knee   . Urinary frequency    takes Flomax daily  . Urinary urgency     Past Surgical History:  Procedure Laterality Date  . BACK SURGERY  [id number]   lumb fusion  . COLONOSCOPY    . KNEE ARTHROSCOPY WITH MEDIAL MENISECTOMY Right 09/26/2013   Procedure: RIGHT KNEE ARTHROSCOPY WITH PARTIAL MEDIAL and lateral chrondroplasty MENISECTOMY;  Surgeon: Lorn Junes, MD;  Location: [facility];  Service: Orthopedics;  Laterality: Right;  . left elbow surgery    . LITHOTRIPSY    . NASAL SEPTUM SURGERY    . removal of kidney stones  1981   lt open removal  . SHOULDER ARTHROSCOPY WITH ROTATOR CUFF REPAIR AND SUBACROMIAL DECOMPRESSION Left 09/26/2013    Procedure: LEFT SHOULDER ARTHROSCOPY WITH DEBRIDEMENT, PARTIAL ROTATOR CUFF REPAIR AND SUBACROMIAL DECOMPRESSION AND SAD ACD DISTAL CLAVICULECTOMY;  Surgeon: Lorn Junes, MD;  Location: [facility];  Service: Orthopedics;  Laterality: Left;  . TONSILLECTOMY      There were no vitals filed for this visit.  Subjective Assessment - 02/02/18 1438    Subjective  Went to MD last week and he was pleased. Cont. PT for strengthening no restrictions    Limitations  House hold activities;Lifting    Patient Stated Goals  Use my arm again    Currently in Pain?  Yes    Pain Score  2     Pain Location  Shoulder    Pain Orientation  Right    Pain Descriptors / Indicators  Sore    Pain Type  Surgical pain    Pain Onset  More than a month ago    Pain Frequency  Intermittent                      [facility] Adult PT Treatment/Exercise - 02/02/18 0001      Shoulder Exercises: Prone   Retraction  Right  Row 4x10    Retraction Weight (lbs)  2    Extension  Strengthening;Right;10 reps;20 reps;Weights 4x10    Extension Weight (lbs)  2      Shoulder Exercises: Sidelying   External Rotation  Strengthening;Right;Weights 3x15    External Rotation Weight (lbs)  1    Other Sidelying Exercises  flexion 1# 3x10      Shoulder Exercises: Standing   Shoulder Flexion Weight (lbs)  2    Flexion Limitations  4x10    ABduction Limitations  4x10 scaption 2#      Shoulder Exercises: ROM/Strengthening   UBE (Upper Arm Bike)  8 minutes (4 min forward/ 4 min backward) at 90 RPM's.    Wall Pushups  20 reps;10 reps    Wall Pushups Limitations  standing      Modalities   Modalities  Electrical Stimulation;Vasopneumatic      Electrical Stimulation   Electrical Stimulation Location  R shoulder premod 80-150 Hz x 15 min    Electrical Stimulation Goals  Pain      Vasopneumatic   Number Minutes Vasopneumatic   15 minutes    Vasopnuematic Location   Shoulder    Vasopneumatic Pressure   Medium    Vasopneumatic Temperature   34                  PT Long Term Goals - 01/16/18 1159      PT LONG TERM GOAL #5   Title  Pt to demonstrate 4+/5 strength in the R shoulder to normalize ADLS.    Time  4    Period  Weeks    Status  New      PT LONG TERM GOAL #6   Title  -----------------------------------------            Plan - 02/02/18 1731    Clinical Impression Statement  Pt arrived today with N.O from MD to continue with progression of strengthening for RT shldr. Rx focused on increased sets this week with wt remaining the same and 4x10. Sidelying exs were also added  today and Pt did well. Increase ROWs to 3#s next week.. Normal response to modalities today.    Clinical Presentation  Stable    Clinical Decision Making  Low    Rehab Potential  Good    Clinical Impairments Affecting Rehab Potential  surgery 10/21/17 current 12 weeks 01/13/18 / FOTO 12th visit 31% improved (initial 96%)       Patient will benefit from skilled therapeutic intervention in order to improve the following deficits and impairments:  Decreased range of motion, Pain, Impaired UE functional use, Postural dysfunction  Visit Diagnosis: Muscle weakness (generalized)  Acute pain of right shoulder  Stiffness of right shoulder, not elsewhere classified  Other abnormalities of gait and mobility  Myalgia     Problem List Patient Active Problem List   Diagnosis Date Noted  . Hydrocele sac 01/06/2016  . Varicocele 01/06/2016  . Erectile dysfunction of organic origin 12/20/2015  . BPH with obstruction/lower urinary tract symptoms 12/20/2015  . History of hypogonadism 12/20/2015  . History of nephrolithiasis 12/20/2015  . Neuropathy 11/20/2015  . GERD (gastroesophageal reflux disease)   . Chronic back pain   . Hypertension   . Allergic rhinitis 07/25/2013  . Insomnia 07/25/2013  . Diabetes ([place]) 03/16/2013  . Hyperlipidemia with target LDL less than 100 03/16/2013  .  Depression 03/16/2013  . Lumbar stenosis with neurogenic claudication 12/26/2012    RAMSEUR,CHRIS, PTA 02/02/2018, 5:43 PM  [facility] [place], [place], [address] Phone: [phone]   Fax:  [phone]  Name: [name] MRN: [id number] Date of Birth: [birth year]

## 2018-02-06 ENCOUNTER — Encounter: Payer: 59 | Admitting: Physical Therapy

## 2018-02-08 ENCOUNTER — Ambulatory Visit: Payer: PPO | Admitting: Physical Therapy

## 2018-02-08 ENCOUNTER — Encounter: Payer: Self-pay | Admitting: Physical Therapy

## 2018-02-08 DIAGNOSIS — M6281 Muscle weakness (generalized): Secondary | ICD-10-CM | POA: Diagnosis not present

## 2018-02-08 DIAGNOSIS — M25611 Stiffness of right shoulder, not elsewhere classified: Secondary | ICD-10-CM

## 2018-02-08 DIAGNOSIS — M25511 Pain in right shoulder: Secondary | ICD-10-CM

## 2018-02-08 NOTE — Therapy (Signed)
[facility] [place], [place], [address] Phone: [phone]   Fax:  [phone]  Physical Therapy Treatment  Patient Details  Name: [name] MRN: [id number] Date of Birth: 1961/08/05 Referring Provider: Elsie Saas, MD   Encounter Date: 02/08/2018  PT End of Session - 02/08/18 1512    Visit Number  24    Number of Visits  26    Date for PT Re-Evaluation  02/16/18    Authorization Type  g -code every 10th visit    PT Start Time  1443    PT Stop Time  1527    PT Time Calculation (min)  44 min    Activity Tolerance  Patient tolerated treatment well    Behavior During Therapy  [facility] for tasks assessed/performed       Past Medical History:  Diagnosis Date  . Arthritis   . Chronic back pain    stenosis  . Constipation    with pain meds  . Depression    takes Zoloft daily  . Diabetes mellitus without complication ([place])    takes Metformni daily  . Dizziness   . GERD (gastroesophageal reflux disease)    takes Nexium daily  . Headache(784.0)    occasionally  . Hyperlipidemia    takes Zetia daily  . Hypertension    takes Benicar daily  . Impingement syndrome of left shoulder   . Insomnia    takes Ambien nightly  . Joint pain   . Left rotator cuff tear   . Pneumonia 1986   walking  . Seasonal allergies    takes Claritin daily  . Tear of medial meniscus of right knee   . Urinary frequency    takes Flomax daily  . Urinary urgency     Past Surgical History:  Procedure Laterality Date  . BACK SURGERY  [id number]   lumb fusion  . COLONOSCOPY    . KNEE ARTHROSCOPY WITH MEDIAL MENISECTOMY Right 09/26/2013   Procedure: RIGHT KNEE ARTHROSCOPY WITH PARTIAL MEDIAL and lateral chrondroplasty MENISECTOMY;  Surgeon: Lorn Junes, MD;  Location: [facility];  Service: Orthopedics;  Laterality: Right;  . left elbow surgery    . LITHOTRIPSY    . NASAL SEPTUM SURGERY    . removal of kidney stones   1981   lt open removal  . SHOULDER ARTHROSCOPY WITH ROTATOR CUFF REPAIR AND SUBACROMIAL DECOMPRESSION Left 09/26/2013   Procedure: LEFT SHOULDER ARTHROSCOPY WITH DEBRIDEMENT, PARTIAL ROTATOR CUFF REPAIR AND SUBACROMIAL DECOMPRESSION AND SAD ACD DISTAL CLAVICULECTOMY;  Surgeon: Lorn Junes, MD;  Location: [facility];  Service: Orthopedics;  Laterality: Left;  . TONSILLECTOMY      There were no vitals filed for this visit.  Subjective Assessment - 02/08/18 1444    Subjective  No new complaints    Limitations  House hold activities;Lifting    Patient Stated Goals  Use my arm again    Currently in Pain?  Yes    Pain Score  1     Pain Location  Shoulder    Pain Orientation  Right    Pain Descriptors / Indicators  Sore    Pain Type  Surgical pain    Pain Onset  More than a month ago    Pain Frequency  Intermittent    Aggravating Factors   prolong reaching    Pain Relieving Factors  rest  [place] Adult PT Treatment/Exercise - 02/08/18 0001      Shoulder Exercises: Prone   Retraction  Strengthening;Right;Limitations    Retraction Weight (lbs)  3    Retraction Limitations  3x15 kneeling    Extension  Strengthening;Right;Weights;Limitations    Extension Weight (lbs)  3    Extension Limitations  3x15 kneeling      Shoulder Exercises: Standing   External Rotation  Strengthening;Right;Theraband    Theraband Level (Shoulder External Rotation)  Level 2 (Red)    External Rotation Limitations  3x15    Flexion  Strengthening    Theraband Level (Shoulder Flexion)  Other (comment)    Shoulder Flexion Weight (lbs)  3    Flexion Limitations  3x15 in scaption    Row  Strengthening;20 reps;Theraband    Theraband Level (Shoulder Row)  Other (comment)    Row Limitations  with pink XTS      Shoulder Exercises: ROM/Strengthening   UBE (Upper Arm Bike)  8 minutes (4 min forward/ 4 min backward) at 90 RPM's.    Other ROM/Strengthening Exercises   push up on high mat table 2x10      Electrical Stimulation   Electrical Stimulation Location  R shoulder premod 80-150 Hz x 15 min    Electrical Stimulation Goals  Pain      Vasopneumatic   Number Minutes Vasopneumatic   15 minutes    Vasopnuematic Location   Shoulder    Vasopneumatic Pressure  Medium                  PT Long Term Goals - 01/16/18 1159      PT LONG TERM GOAL #5   Title  Pt to demonstrate 4+/5 strength in the R shoulder to normalize ADLS.    Time  4    Period  Weeks    Status  New      PT LONG TERM GOAL #6   Title  -----------------------------------------            Plan - 02/08/18 1513    Clinical Impression Statement  Patient tolerated treatment well today. Patient reported minimal discomfort overall. Patient able to progress exercises today with no difficulty. Patient overall progressing toward strength goal.     Rehab Potential  Good    PT Frequency  2x / week    PT Treatment/Interventions  Moist Heat;Functional mobility training;Neuromuscular re-education;Therapeutic exercise;Therapeutic activities;Patient/family education;Manual techniques;Scar mobilization;Manual lymph drainage;Taping;ADLs/Self Care Home Management;Electrical Stimulation;Passive range of motion    PT Next Visit Plan  Continue POC to improve strength for functional activities per protocol    Consulted and Agree with Plan of Care  Patient       Patient will benefit from skilled therapeutic intervention in order to improve the following deficits and impairments:  Decreased range of motion, Pain, Impaired UE functional use, Postural dysfunction  Visit Diagnosis: Muscle weakness (generalized)  Acute pain of right shoulder  Stiffness of right shoulder, not elsewhere classified     Problem List Patient Active Problem List   Diagnosis Date Noted  . Hydrocele sac 01/06/2016  . Varicocele 01/06/2016  . Erectile dysfunction of organic origin 12/20/2015  . BPH with  obstruction/lower urinary tract symptoms 12/20/2015  . History of hypogonadism 12/20/2015  . History of nephrolithiasis 12/20/2015  . Neuropathy 11/20/2015  . GERD (gastroesophageal reflux disease)   . Chronic back pain   . Hypertension   . Allergic rhinitis 07/25/2013  . Insomnia 07/25/2013  . Diabetes ([place]) 03/16/2013  . Hyperlipidemia  with target LDL less than 100 03/16/2013  . Depression 03/16/2013  . Lumbar stenosis with neurogenic claudication 12/26/2012    Phillips Climes, PTA 02/08/2018, 3:31 PM  [facility] [place], [place], [address] Phone: [phone]   Fax:  [phone]  Name: [name] MRN: [id number] Date of Birth: 09-20-1961

## 2018-02-09 ENCOUNTER — Ambulatory Visit: Payer: PPO | Admitting: Physical Therapy

## 2018-02-09 DIAGNOSIS — M6281 Muscle weakness (generalized): Secondary | ICD-10-CM

## 2018-02-09 DIAGNOSIS — M25511 Pain in right shoulder: Secondary | ICD-10-CM

## 2018-02-09 DIAGNOSIS — M25611 Stiffness of right shoulder, not elsewhere classified: Secondary | ICD-10-CM

## 2018-02-09 NOTE — Therapy (Signed)
[facility] [place], [place], [address] Phone: [phone]   Fax:  [phone]  Physical Therapy Treatment  Patient Details  Name: [name] MRN: [id number] Date of Birth: 01-03-1961 Referring Provider: Elsie Saas, MD   Encounter Date: 02/09/2018  PT End of Session - 02/09/18 1305    Visit Number  25    Number of Visits  26    Date for PT Re-Evaluation  02/16/18    Authorization Type  g -code every 10th visit    PT Start Time  1302    PT Stop Time  1358    PT Time Calculation (min)  56 min    Activity Tolerance  Patient tolerated treatment well    Behavior During Therapy  [facility] for tasks assessed/performed       Past Medical History:  Diagnosis Date  . Arthritis   . Chronic back pain    stenosis  . Constipation    with pain meds  . Depression    takes Zoloft daily  . Diabetes mellitus without complication ([place])    takes Metformni daily  . Dizziness   . GERD (gastroesophageal reflux disease)    takes Nexium daily  . Headache(784.0)    occasionally  . Hyperlipidemia    takes Zetia daily  . Hypertension    takes Benicar daily  . Impingement syndrome of left shoulder   . Insomnia    takes Ambien nightly  . Joint pain   . Left rotator cuff tear   . Pneumonia 1986   walking  . Seasonal allergies    takes Claritin daily  . Tear of medial meniscus of right knee   . Urinary frequency    takes Flomax daily  . Urinary urgency     Past Surgical History:  Procedure Laterality Date  . BACK SURGERY  [id number]   lumb fusion  . COLONOSCOPY    . KNEE ARTHROSCOPY WITH MEDIAL MENISECTOMY Right 09/26/2013   Procedure: RIGHT KNEE ARTHROSCOPY WITH PARTIAL MEDIAL and lateral chrondroplasty MENISECTOMY;  Surgeon: Lorn Junes, MD;  Location: [place];  Service: Orthopedics;  Laterality: Right;  . left elbow surgery    . LITHOTRIPSY    . NASAL SEPTUM SURGERY    . removal of kidney stones   1981   lt open removal  . SHOULDER ARTHROSCOPY WITH ROTATOR CUFF REPAIR AND SUBACROMIAL DECOMPRESSION Left 09/26/2013   Procedure: LEFT SHOULDER ARTHROSCOPY WITH DEBRIDEMENT, PARTIAL ROTATOR CUFF REPAIR AND SUBACROMIAL DECOMPRESSION AND SAD ACD DISTAL CLAVICULECTOMY;  Surgeon: Lorn Junes, MD;  Location: [facility];  Service: Orthopedics;  Laterality: Left;  . TONSILLECTOMY      There were no vitals filed for this visit.  Subjective Assessment - 02/09/18 1305    Subjective  Patient reports 1/10 pain in shoulder. Patient noted it's an annoyance and irritation more than a it is a pain.    Limitations  House hold activities;Lifting    Patient Stated Goals  Use my arm again    Currently in Pain?  Yes    Pain Score  1     Pain Orientation  Right    Pain Descriptors / Indicators  Sore    Pain Type  Surgical pain    Pain Onset  More than a month ago         [facility] PT Assessment - 02/09/18 0001      Assessment   Medical Diagnosis  R rotator cuff repair/  SAD/ DCE      Precautions   Precautions  Shoulder      Balance Screen   Has the patient fallen in the past 6 months  No                  [facility] Adult PT Treatment/Exercise - 02/09/18 0001      Shoulder Exercises: Prone   Retraction  Strengthening    Retraction Weight (lbs)  3    Retraction Limitations  3x15 kneeling    Extension  Strengthening    Extension Weight (lbs)  3    Extension Limitations  3x15 kneeling      Shoulder Exercises: Standing   External Rotation  Strengthening;Right;Theraband    Theraband Level (Shoulder External Rotation)  Level 2 (Red)    External Rotation Limitations  3x15    Flexion  Strengthening    Theraband Level (Shoulder Flexion)  Other (comment)    Shoulder Flexion Weight (lbs)  3    Flexion Limitations  3x15 in scaption    Other Standing Exercises  scapular clocks x10      Shoulder Exercises: ROM/Strengthening   UBE (Upper Arm Bike)  8 minutes (4 min forward/ 4  min backward) at 90 RPM's.    Other ROM/Strengthening Exercises  push up on high mat table 2x10      Shoulder Exercises: Body Blade   Flexion  30 seconds;3 reps ~ 90 degrees of flexion    External Rotation  30 seconds;3 reps 0 degreees abduction      Modalities   Modalities  Electrical Stimulation;Vasopneumatic      Electrical Stimulation   Electrical Stimulation Location  R shoulder premod 80-150 Hz x 15 min    Electrical Stimulation Goals  Pain      Vasopneumatic   Number Minutes Vasopneumatic   15 minutes    Vasopnuematic Location   Shoulder    Vasopneumatic Pressure  Medium                  PT Long Term Goals - 01/16/18 1159      PT LONG TERM GOAL #5   Title  Pt to demonstrate 4+/5 strength in the R shoulder to normalize ADLS.    Time  4    Period  Weeks    Status  New      PT LONG TERM GOAL #6   Title  -----------------------------------------            Plan - 02/09/18 1307    Clinical Impression Statement  Patient tolerated treatment well today. Patient stated he felt more muscle fatigue today but attributed it to back to back therapy sessions. No adverse effects upon removal of modalities    Clinical Presentation  Stable    Clinical Decision Making  Low    Rehab Potential  Good    Clinical Impairments Affecting Rehab Potential  surgery 10/21/17 current 12 weeks 01/13/18 / FOTO 12th visit 31% improved (initial 96%)    PT Frequency  2x / week    PT Treatment/Interventions  Moist Heat;Functional mobility training;Neuromuscular re-education;Therapeutic exercise;Therapeutic activities;Patient/family education;Manual techniques;Scar mobilization;Manual lymph drainage;Taping;ADLs/Self Care Home Management;Electrical Stimulation;Passive range of motion    PT Next Visit Plan  Assess goals and FOTO next visit. Continue POC to improve strength for functional activities per protocol    Consulted and Agree with Plan of Care  Patient       Patient will benefit  from skilled therapeutic intervention in order to improve the following  deficits and impairments:  Decreased range of motion, Pain, Impaired UE functional use, Postural dysfunction  Visit Diagnosis: Muscle weakness (generalized)  Acute pain of right shoulder  Stiffness of right shoulder, not elsewhere classified     Problem List Patient Active Problem List   Diagnosis Date Noted  . Hydrocele sac 01/06/2016  . Varicocele 01/06/2016  . Erectile dysfunction of organic origin 12/20/2015  . BPH with obstruction/lower urinary tract symptoms 12/20/2015  . History of hypogonadism 12/20/2015  . History of nephrolithiasis 12/20/2015  . Neuropathy 11/20/2015  . GERD (gastroesophageal reflux disease)   . Chronic back pain   . Hypertension   . Allergic rhinitis 07/25/2013  . Insomnia 07/25/2013  . Diabetes ([place]) 03/16/2013  . Hyperlipidemia with target LDL less than 100 03/16/2013  . Depression 03/16/2013  . Lumbar stenosis with neurogenic claudication 12/26/2012    Gabriela Eves, PT, DPT 02/09/2018, 6:08 PM  [facility] [address] [place], [place], [address] Phone: [phone]   Fax:  [phone]  Name: [name] MRN: [id number] Date of Birth: 11-07-1961

## 2018-02-13 ENCOUNTER — Ambulatory Visit: Payer: PPO | Admitting: Nurse Practitioner

## 2018-02-14 ENCOUNTER — Encounter: Payer: Self-pay | Admitting: *Deleted

## 2018-02-14 ENCOUNTER — Ambulatory Visit: Payer: PPO | Attending: Orthopedic Surgery | Admitting: *Deleted

## 2018-02-14 DIAGNOSIS — M25611 Stiffness of right shoulder, not elsewhere classified: Secondary | ICD-10-CM | POA: Diagnosis not present

## 2018-02-14 DIAGNOSIS — M791 Myalgia, unspecified site: Secondary | ICD-10-CM

## 2018-02-14 DIAGNOSIS — M25511 Pain in right shoulder: Secondary | ICD-10-CM | POA: Diagnosis not present

## 2018-02-14 DIAGNOSIS — M6281 Muscle weakness (generalized): Secondary | ICD-10-CM | POA: Diagnosis not present

## 2018-02-14 DIAGNOSIS — R2689 Other abnormalities of gait and mobility: Secondary | ICD-10-CM | POA: Diagnosis not present

## 2018-02-14 NOTE — Therapy (Signed)
[facility] [place], [place], [address] Phone: [phone]   Fax:  [phone]  Physical Therapy Treatment  Patient Details  Name: [name] MRN: [id number] Date of Birth: Nov 19, 1961 Referring Provider: Elsie Saas, MD   Encounter Date: 02/14/2018  PT End of Session - 02/14/18 1128    Visit Number  26    Number of Visits  34    Date for PT Re-Evaluation  03/03/18    Authorization Type  g -code every 10th visit    PT Start Time  1030    PT Stop Time  1131    PT Time Calculation (min)  61 min       Past Medical History:  Diagnosis Date  . Arthritis   . Chronic back pain    stenosis  . Constipation    with pain meds  . Depression    takes Zoloft daily  . Diabetes mellitus without complication ([place])    takes Metformni daily  . Dizziness   . GERD (gastroesophageal reflux disease)    takes Nexium daily  . Headache(784.0)    occasionally  . Hyperlipidemia    takes Zetia daily  . Hypertension    takes Benicar daily  . Impingement syndrome of left shoulder   . Insomnia    takes Ambien nightly  . Joint pain   . Left rotator cuff tear   . Pneumonia 1986   walking  . Seasonal allergies    takes Claritin daily  . Tear of medial meniscus of right knee   . Urinary frequency    takes Flomax daily  . Urinary urgency     Past Surgical History:  Procedure Laterality Date  . BACK SURGERY  [id number]   lumb fusion  . COLONOSCOPY    . KNEE ARTHROSCOPY WITH MEDIAL MENISECTOMY Right 09/26/2013   Procedure: RIGHT KNEE ARTHROSCOPY WITH PARTIAL MEDIAL and lateral chrondroplasty MENISECTOMY;  Surgeon: Lorn Junes, MD;  Location: [facility];  Service: Orthopedics;  Laterality: Right;  . left elbow surgery    . LITHOTRIPSY    . NASAL SEPTUM SURGERY    . removal of kidney stones  1981   lt open removal  . SHOULDER ARTHROSCOPY WITH ROTATOR CUFF REPAIR AND SUBACROMIAL DECOMPRESSION Left 09/26/2013   Procedure: LEFT SHOULDER ARTHROSCOPY WITH DEBRIDEMENT, PARTIAL ROTATOR CUFF REPAIR AND SUBACROMIAL DECOMPRESSION AND SAD ACD DISTAL CLAVICULECTOMY;  Surgeon: Lorn Junes, MD;  Location: [facility];  Service: Orthopedics;  Laterality: Left;  . TONSILLECTOMY      There were no vitals filed for this visit.  Subjective Assessment - 02/14/18 1042    Subjective  Patient reports 1-2/10 pain in shoulder. Patient noted it's an annoyance and irritation more than a it is a pain.    Limitations  House hold activities;Lifting    Currently in Pain?  Yes    Pain Score  2     Pain Orientation  Right    Pain Descriptors / Indicators  Sore    Pain Type  Surgical pain    Pain Onset  More than a month ago                      [facility] Adult PT Treatment/Exercise - 02/14/18 0001      Shoulder Exercises: Prone   Retraction  Strengthening;10 reps 5x10    Retraction Weight (lbs)  3    Extension  Strengthening;Right;10 reps 5x10  Extension Weight (lbs)  3      Shoulder Exercises: Standing   External Rotation  Strengthening;Right;Theraband 5x10    Theraband Level (Shoulder External Rotation)  Level 2 (Red)    Flexion  Strengthening;10 reps 5x10    Shoulder Flexion Weight (lbs)  3    Flexion Limitations  5x10 3# in scaption    Other Standing Exercises  Flexion red Tband for posterior deltoid and cuff to 90 degrees      Shoulder Exercises: ROM/Strengthening   UBE (Upper Arm Bike)  8 minutes (4 min forward/ 4 min backward) at 90 RPM's.    Other ROM/Strengthening Exercises  push up on high mat table 2x10      Shoulder Exercises: Body Blade   Flexion  30 seconds;3 reps ~ 90 degrees of flexion    External Rotation  30 seconds;3 reps 0 degreees abduction      Modalities   Modalities  Electrical Stimulation;Vasopneumatic      Electrical Stimulation   Electrical Stimulation Location  R shoulder premod 80-150 Hz x 15 min      Vasopneumatic   Number Minutes Vasopneumatic    15 minutes    Vasopnuematic Location   Shoulder    Vasopneumatic Pressure  Medium    Vasopneumatic Temperature   34                  PT Long Term Goals - 01/16/18 1159      PT LONG TERM GOAL #5   Title  Pt to demonstrate 4+/5 strength in the R shoulder to normalize ADLS.    Time  4    Period  Weeks    Status  New      PT LONG TERM GOAL #6   Title  -----------------------------------------            Plan - 02/14/18 1132    Clinical Impression Statement  Pt arrived today doing fairly well with mainly just some discomfort RT shldr 1-2/10. Rx focused on progression of sets to 5 x 10 shldr stability/ rhythmic stabilization. Mainly fatigued end of RX     Clinical Impairments Affecting Rehab Potential  surgery 10/21/17 current 12 weeks 01/13/18 / FOTO 12th visit 31% improved (initial 96%)    PT Frequency  2x / week    PT Next Visit Plan  Assess goals and FOTO next visit. Continue POC to improve strength for functional activities per protocol    PT Home Exercise Plan  issue HEP    Consulted and Agree with Plan of Care  Patient       Patient will benefit from skilled therapeutic intervention in order to improve the following deficits and impairments:     Visit Diagnosis: Muscle weakness (generalized)  Acute pain of right shoulder  Stiffness of right shoulder, not elsewhere classified  Other abnormalities of gait and mobility  Myalgia     Problem List Patient Active Problem List   Diagnosis Date Noted  . Hydrocele sac 01/06/2016  . Varicocele 01/06/2016  . Erectile dysfunction of organic origin 12/20/2015  . BPH with obstruction/lower urinary tract symptoms 12/20/2015  . History of hypogonadism 12/20/2015  . History of nephrolithiasis 12/20/2015  . Neuropathy 11/20/2015  . GERD (gastroesophageal reflux disease)   . Chronic back pain   . Hypertension   . Allergic rhinitis 07/25/2013  . Insomnia 07/25/2013  . Diabetes ([place]) 03/16/2013  . Hyperlipidemia  with target LDL less than 100 03/16/2013  . Depression 03/16/2013  . Lumbar stenosis  with neurogenic claudication 12/26/2012    RAMSEUR,CHRIS, PTA 02/14/2018, 12:59 PM  [facility] [place], [place], [address] Phone: [phone]   Fax:  [phone]  Name: [name] MRN: [id number] Date of Birth: Jul 26, 1961

## 2018-02-16 ENCOUNTER — Ambulatory Visit: Payer: PPO | Admitting: *Deleted

## 2018-02-16 DIAGNOSIS — M6281 Muscle weakness (generalized): Secondary | ICD-10-CM | POA: Diagnosis not present

## 2018-02-16 DIAGNOSIS — M25611 Stiffness of right shoulder, not elsewhere classified: Secondary | ICD-10-CM

## 2018-02-16 DIAGNOSIS — R2689 Other abnormalities of gait and mobility: Secondary | ICD-10-CM

## 2018-02-16 DIAGNOSIS — M25511 Pain in right shoulder: Secondary | ICD-10-CM

## 2018-02-16 DIAGNOSIS — M791 Myalgia, unspecified site: Secondary | ICD-10-CM

## 2018-02-16 NOTE — Therapy (Signed)
[facility] [place], [place], [address] Phone: [phone]   Fax:  [phone]  Physical Therapy Treatment  Patient Details  Name: [name] MRN: [id number] Date of Birth: [birth year] Referring Provider: Elsie Saas, MD   Encounter Date: 02/16/2018  PT End of Session - 02/16/18 1205    Visit Number  27    Number of Visits  34    Date for PT Re-Evaluation  03/03/18    PT Start Time  1118    PT Stop Time  1205    PT Time Calculation (min)  47 min       Past Medical History:  Diagnosis Date  . Arthritis   . Chronic back pain    stenosis  . Constipation    with pain meds  . Depression    takes Zoloft daily  . Diabetes mellitus without complication ([place])    takes Metformni daily  . Dizziness   . GERD (gastroesophageal reflux disease)    takes Nexium daily  . Headache(784.0)    occasionally  . Hyperlipidemia    takes Zetia daily  . Hypertension    takes Benicar daily  . Impingement syndrome of left shoulder   . Insomnia    takes Ambien nightly  . Joint pain   . Left rotator cuff tear   . Pneumonia 1986   walking  . Seasonal allergies    takes Claritin daily  . Tear of medial meniscus of right knee   . Urinary frequency    takes Flomax daily  . Urinary urgency     Past Surgical History:  Procedure Laterality Date  . BACK SURGERY  [id number]   lumb fusion  . COLONOSCOPY    . KNEE ARTHROSCOPY WITH MEDIAL MENISECTOMY Right 09/26/2013   Procedure: RIGHT KNEE ARTHROSCOPY WITH PARTIAL MEDIAL and lateral chrondroplasty MENISECTOMY;  Surgeon: Lorn Junes, MD;  Location: [facility];  Service: Orthopedics;  Laterality: Right;  . left elbow surgery    . LITHOTRIPSY    . NASAL SEPTUM SURGERY    . removal of kidney stones  1981   lt open removal  . SHOULDER ARTHROSCOPY WITH ROTATOR CUFF REPAIR AND SUBACROMIAL DECOMPRESSION Left 09/26/2013   Procedure: LEFT SHOULDER ARTHROSCOPY WITH  DEBRIDEMENT, PARTIAL ROTATOR CUFF REPAIR AND SUBACROMIAL DECOMPRESSION AND SAD ACD DISTAL CLAVICULECTOMY;  Surgeon: Lorn Junes, MD;  Location: [facility];  Service: Orthopedics;  Laterality: Left;  . TONSILLECTOMY      There were no vitals filed for this visit.  Subjective Assessment - 02/16/18 1135    Subjective  Patient reports 1-2/10 pain/ soreness in shoulder. Patient noted it's an annoyance and irritation more than a it is a pain.    Limitations  House hold activities;Lifting    Patient Stated Goals  Use my arm again    Currently in Pain?  Yes    Pain Score  2     Pain Location  Shoulder    Pain Orientation  Right    Pain Descriptors / Indicators  Sore    Pain Type  Surgical pain    Pain Onset  More than a month ago    Pain Frequency  Intermittent                      [facility] Adult PT Treatment/Exercise - 02/16/18 0001      Shoulder Exercises: Prone   Retraction  Strengthening;10 reps 5x10  Retraction Weight (lbs)  4    Extension  Strengthening;Right;10 reps 5x10    Extension Weight (lbs)  4      Shoulder Exercises: Standing   External Rotation  Strengthening;Right;Theraband 5x10    Theraband Level (Shoulder External Rotation)  Level 2 (Red)    Flexion  Strengthening;10 reps 5x10    Shoulder Flexion Weight (lbs)  3    Flexion Limitations  5x10 3# in scaption    Other Standing Exercises  Flexion red Tband for posterior deltoid and cuff to 90 degrees      Shoulder Exercises: ROM/Strengthening   UBE (Upper Arm Bike)  10 minutes (5 min forward/ 26min backward) at 90 RPM's.      Modalities   Modalities  Electrical Stimulation;Vasopneumatic;Moist Heat      Moist Heat Therapy   Number Minutes Moist Heat  15 Minutes    Moist Heat Location  Shoulder      Electrical Stimulation   Electrical Stimulation Location  R shoulder premod 80-150 Hz x 15 min    Electrical Stimulation Goals  Pain                  PT Long Term Goals -  01/16/18 1159      PT LONG TERM GOAL #5   Title  Pt to demonstrate 4+/5 strength in the R shoulder to normalize ADLS.    Time  4    Period  Weeks    Status  New      PT LONG TERM GOAL #6   Title  -----------------------------------------            Plan - 02/16/18 1205    Clinical Impression Statement  Pt arrived today doing fairly well with pain 2/10 RT shldr. Pt was able to perform and progress therex today for strengthening with minimal complaints except fatigue. AROM standing to 156 degrees and HBB to t12. No Vaso today due to N/A.    Clinical Presentation  Stable    Clinical Impairments Affecting Rehab Potential  surgery 10/21/17 current 12 weeks 01/13/18 / FOTO 12th visit 31% improved (initial 96%)    PT Frequency  2x / week    PT Treatment/Interventions  Moist Heat;Functional mobility training;Neuromuscular re-education;Therapeutic exercise;Therapeutic activities;Patient/family education;Manual techniques;Scar mobilization;Manual lymph drainage;Taping;ADLs/Self Care Home Management;Electrical Stimulation;Passive range of motion    PT Next Visit Plan  Assess goals and FOTO next visit. Continue POC to improve strength for functional activities per protocol    PT Home Exercise Plan  issue HEP       Patient will benefit from skilled therapeutic intervention in order to improve the following deficits and impairments:     Visit Diagnosis: Muscle weakness (generalized)  Acute pain of right shoulder  Other abnormalities of gait and mobility  Stiffness of right shoulder, not elsewhere classified  Myalgia     Problem List Patient Active Problem List   Diagnosis Date Noted  . Hydrocele sac 01/06/2016  . Varicocele 01/06/2016  . Erectile dysfunction of organic origin 12/20/2015  . BPH with obstruction/lower urinary tract symptoms 12/20/2015  . History of hypogonadism 12/20/2015  . History of nephrolithiasis 12/20/2015  . Neuropathy 11/20/2015  . GERD (gastroesophageal  reflux disease)   . Chronic back pain   . Hypertension   . Allergic rhinitis 07/25/2013  . Insomnia 07/25/2013  . Diabetes ([place]) 03/16/2013  . Hyperlipidemia with target LDL less than 100 03/16/2013  . Depression 03/16/2013  . Lumbar stenosis with neurogenic claudication 12/26/2012    [name],[name],  PTA 02/16/2018, 12:11 PM  [facility] [place], [place], [address] Phone: [phone]   Fax:  [phone]  Name: [name] MRN: [id number] Date of Birth: [birth year]

## 2018-02-22 ENCOUNTER — Encounter: Payer: 59 | Admitting: Physical Therapy

## 2018-02-23 ENCOUNTER — Telehealth: Payer: Self-pay | Admitting: *Deleted

## 2018-02-23 ENCOUNTER — Ambulatory Visit: Payer: PPO | Admitting: Physical Therapy

## 2018-02-23 DIAGNOSIS — M25511 Pain in right shoulder: Secondary | ICD-10-CM

## 2018-02-23 DIAGNOSIS — M6281 Muscle weakness (generalized): Secondary | ICD-10-CM

## 2018-02-23 NOTE — Telephone Encounter (Signed)
Fax received [facility] [place] Olmesartan HCTZ 40-25 mg tab on backorder Please advise

## 2018-02-23 NOTE — Therapy (Addendum)
[facility] [place], [place], [address] Phone: [phone]   Fax:  [phone]  Physical Therapy Treatment PHYSICAL THERAPY DISCHARGE SUMMARY  Visits from Start of Care: 28  Current functional level related to goals / functional outcomes: See below   Remaining deficits: See goals   Education / Equipment: HEP Plan: Patient agrees to discharge.  Patient goals were not met. Patient is being discharged due to not returning since the last visit.  ?????  Gabriela Eves, PT, DPT 09/04/19   Patient Details  Name: [name] MRN: [id number] Date of Birth: 12-06-1961 Referring Provider: Elsie Saas, MD   Encounter Date: 02/23/2018  PT End of Session - 02/23/18 1116    Visit Number  28    Number of Visits  34    Date for PT Re-Evaluation  03/03/18    PT Start Time  1115    PT Stop Time  1208    PT Time Calculation (min)  53 min    Activity Tolerance  Patient tolerated treatment well    Behavior During Therapy  [facility] for tasks assessed/performed       Past Medical History:  Diagnosis Date  . Arthritis   . Chronic back pain    stenosis  . Constipation    with pain meds  . Depression    takes Zoloft daily  . Diabetes mellitus without complication ([place])    takes Metformni daily  . Dizziness   . GERD (gastroesophageal reflux disease)    takes Nexium daily  . Headache(784.0)    occasionally  . Hyperlipidemia    takes Zetia daily  . Hypertension    takes Benicar daily  . Impingement syndrome of left shoulder   . Insomnia    takes Ambien nightly  . Joint pain   . Left rotator cuff tear   . Pneumonia 1986   walking  . Seasonal allergies    takes Claritin daily  . Tear of medial meniscus of right knee   . Urinary frequency    takes Flomax daily  . Urinary urgency     Past Surgical History:  Procedure Laterality Date  . BACK SURGERY  [id number]   lumb fusion  . COLONOSCOPY    . KNEE ARTHROSCOPY  WITH MEDIAL MENISECTOMY Right 09/26/2013   Procedure: RIGHT KNEE ARTHROSCOPY WITH PARTIAL MEDIAL and lateral chrondroplasty MENISECTOMY;  Surgeon: Lorn Junes, MD;  Location: [facility];  Service: Orthopedics;  Laterality: Right;  . left elbow surgery    . LITHOTRIPSY    . NASAL SEPTUM SURGERY    . removal of kidney stones  1981   lt open removal  . SHOULDER ARTHROSCOPY WITH ROTATOR CUFF REPAIR AND SUBACROMIAL DECOMPRESSION Left 09/26/2013   Procedure: LEFT SHOULDER ARTHROSCOPY WITH DEBRIDEMENT, PARTIAL ROTATOR CUFF REPAIR AND SUBACROMIAL DECOMPRESSION AND SAD ACD DISTAL CLAVICULECTOMY;  Surgeon: Lorn Junes, MD;  Location: [facility];  Service: Orthopedics;  Laterality: Left;  . TONSILLECTOMY      There were no vitals filed for this visit.  Subjective Assessment - 02/23/18 1117    Subjective  Patient reported he went bowling for 3 games with a 12 lb on Tuesday and he felt some soreness the following morning but overall the shoulder feels very good. Patient stated he had set if the pain sets in, he would stop but pain did not come.    Limitations  House hold activities;Lifting    Patient Stated Goals  Use  my arm again    Currently in Pain?  Yes    Pain Score  1     Pain Location  Shoulder    Pain Orientation  Right    Pain Descriptors / Indicators  Sore    Pain Type  Surgical pain    Pain Onset  More than a month ago    Pain Frequency  Intermittent         [facility] PT Assessment - 02/23/18 0001      Assessment   Medical Diagnosis  R rotator cuff repair/ SAD/ DCE    Next MD Visit  02/24/18      Precautions   Precautions  Shoulder      AROM   Right Shoulder Flexion  -- WFL, equal to L Ue    Right Shoulder ABduction  155 Degrees    Right Shoulder Internal Rotation  -- functional IR: T10    Right Shoulder External Rotation  -- functional ER: T4                  [facility] Adult PT Treatment/Exercise - 02/23/18 0001      Shoulder  Exercises: Prone   Retraction  Strengthening    Retraction Weight (lbs)  4    Extension  Strengthening    Extension Weight (lbs)  4    Horizontal ABduction 1  Strengthening;Right;Weights;15 reps    Horizontal ABduction 1 Weight (lbs)  4    Other Prone Exercises  Prone "Y" scaption x15 4#      Shoulder Exercises: Standing   Protraction  Strengthening;Right;Theraband;Limitations;10 reps;20 reps    Theraband Level (Shoulder Protraction)  Level 3 (Green)    External Rotation  Strengthening;Right;Theraband;10 reps;20 reps 3x10    Theraband Level (Shoulder External Rotation)  Level 3 (Green)      Shoulder Exercises: ROM/Strengthening   UBE (Upper Arm Bike)  10 minutes (5 min forward/ 66mn backward) at 90 RPM's.    Wall Pushups  20 reps on low plinth    Plank  Other (comment) 3x5 second hold followed by 1x10 second hold    Other ROM/Strengthening Exercises  D2 flexion with green theraband x15      Modalities   Modalities  Vasopneumatic      Vasopneumatic   Number Minutes Vasopneumatic   15 minutes    Vasopnuematic Location   Shoulder    Vasopneumatic Pressure  Medium    Vasopneumatic Temperature   34                  PT Long Term Goals - 02/23/18 1222      PT LONG TERM GOAL #5   Title  Pt to demonstrate 4+/5 strength in the R shoulder to normalize ADLS.    Time  4    Period  Weeks    Status  On-going            Plan - 02/23/18 1209    Clinical Impression Statement  Patient was able to tolerate treatment well with no reports of increase of pain, just muscle fatigue. Patient's R AROM flexion equal to L unaffected. Patient's AROM abduction 155 degrees. Patient's functional internal rotation to approximately T10 in comparison to T7 with unaffected. Patient's functional external rotation at T4 in comparison to T6 on unaffected UE. Normal response to vasopneumatic device upon removal.     Clinical Presentation  Stable    Clinical Decision Making  Low    Rehab  Potential  Good  Clinical Impairments Affecting Rehab Potential  surgery 10/21/17 current 12 weeks 01/13/18 / FOTO 12th visit 31% improved (initial 96%)    PT Frequency  2x / week    PT Treatment/Interventions  Moist Heat;Functional mobility training;Neuromuscular re-education;Therapeutic exercise;Therapeutic activities;Patient/family education;Manual techniques;Scar mobilization;Manual lymph drainage;Taping;ADLs/Self Care Home Management;Electrical Stimulation;Passive range of motion    PT Next Visit Plan  FOTO next visit. Continue POC to improve strength for functional activities per protocol    Consulted and Agree with Plan of Care  Patient       Patient will benefit from skilled therapeutic intervention in order to improve the following deficits and impairments:  Decreased range of motion, Pain, Impaired UE functional use, Postural dysfunction  Visit Diagnosis: Muscle weakness (generalized)  Acute pain of right shoulder     Problem List Patient Active Problem List   Diagnosis Date Noted  . Hydrocele sac 01/06/2016  . Varicocele 01/06/2016  . Erectile dysfunction of organic origin 12/20/2015  . BPH with obstruction/lower urinary tract symptoms 12/20/2015  . History of hypogonadism 12/20/2015  . History of nephrolithiasis 12/20/2015  . Neuropathy 11/20/2015  . GERD (gastroesophageal reflux disease)   . Chronic back pain   . Hypertension   . Allergic rhinitis 07/25/2013  . Insomnia 07/25/2013  . Diabetes ([place]) 03/16/2013  . Hyperlipidemia with target LDL less than 100 03/16/2013  . Depression 03/16/2013  . Lumbar stenosis with neurogenic claudication 12/26/2012   Gabriela Eves, PT, DPT 02/23/2018, 12:23 PM  [facility] [address] [place], [place], [address] Phone: [phone]   Fax:  [phone]  Name: [name] MRN: [id number] Date of Birth: Dec 18, 1960

## 2018-02-24 MED ORDER — TELMISARTAN-HCTZ 40-12.5 MG PO TABS: 1.0000 | ORAL_TABLET | Freq: Every day | ORAL | 3 refills | Status: DC

## 2018-02-24 NOTE — Telephone Encounter (Signed)
Left detailed message for pt regarding change in medication

## 2018-02-24 NOTE — Telephone Encounter (Signed)
See note

## 2018-02-24 NOTE — Telephone Encounter (Signed)
telmisartin was sent in in place of olmesartan while med is on backorder

## 2018-03-02 DIAGNOSIS — M19011 Primary osteoarthritis, right shoulder: Secondary | ICD-10-CM | POA: Diagnosis not present

## 2018-04-10 ENCOUNTER — Other Ambulatory Visit: Payer: Self-pay | Admitting: Nurse Practitioner

## 2018-04-10 DIAGNOSIS — E119 Type 2 diabetes mellitus without complications: Secondary | ICD-10-CM

## 2018-04-10 NOTE — Telephone Encounter (Signed)
VM full

## 2018-04-10 NOTE — Telephone Encounter (Signed)
Last refill without being seen 

## 2018-04-21 ENCOUNTER — Other Ambulatory Visit: Payer: Self-pay | Admitting: Nurse Practitioner

## 2018-04-21 DIAGNOSIS — F5101 Primary insomnia: Secondary | ICD-10-CM

## 2018-07-12 ENCOUNTER — Other Ambulatory Visit: Payer: Self-pay | Admitting: Nurse Practitioner

## 2018-07-12 DIAGNOSIS — E119 Type 2 diabetes mellitus without complications: Secondary | ICD-10-CM

## 2018-07-14 ENCOUNTER — Other Ambulatory Visit: Payer: Self-pay | Admitting: *Deleted

## 2018-07-14 ENCOUNTER — Other Ambulatory Visit: Payer: Self-pay | Admitting: Nurse Practitioner

## 2018-07-14 DIAGNOSIS — M5442 Lumbago with sciatica, left side: Principal | ICD-10-CM

## 2018-07-14 DIAGNOSIS — G8929 Other chronic pain: Secondary | ICD-10-CM

## 2018-07-14 DIAGNOSIS — M5441 Lumbago with sciatica, right side: Principal | ICD-10-CM

## 2018-07-14 DIAGNOSIS — F5101 Primary insomnia: Secondary | ICD-10-CM

## 2018-07-14 MED ORDER — ZOLPIDEM TARTRATE 10 MG PO TABS: 10.0000 mg | ORAL_TABLET | Freq: Every evening | ORAL | 0 refills | Status: DC | PRN

## 2018-07-14 MED ORDER — GABAPENTIN 300 MG PO CAPS: 300.0000 mg | ORAL_CAPSULE | Freq: Two times a day (BID) | ORAL | 0 refills | Status: DC

## 2018-07-16 ENCOUNTER — Other Ambulatory Visit: Payer: Self-pay | Admitting: Nurse Practitioner

## 2018-07-16 DIAGNOSIS — E119 Type 2 diabetes mellitus without complications: Secondary | ICD-10-CM

## 2018-07-18 NOTE — Telephone Encounter (Signed)
OV 08/08/18

## 2018-07-28 NOTE — Telephone Encounter (Signed)
Has appointment 8/27

## 2018-08-08 ENCOUNTER — Ambulatory Visit: Payer: PPO | Admitting: Nurse Practitioner

## 2018-08-09 ENCOUNTER — Encounter: Payer: Self-pay | Admitting: Nurse Practitioner

## 2018-08-23 ENCOUNTER — Encounter: Payer: Self-pay | Admitting: Nurse Practitioner

## 2018-08-23 ENCOUNTER — Ambulatory Visit (INDEPENDENT_AMBULATORY_CARE_PROVIDER_SITE_OTHER): Payer: PPO | Admitting: Nurse Practitioner

## 2018-08-23 VITALS — BP 130/86 | HR 88 | Temp 98.0°F | Ht 70.0 in | Wt 230.6 lb

## 2018-08-23 DIAGNOSIS — I10 Essential (primary) hypertension: Secondary | ICD-10-CM

## 2018-08-23 DIAGNOSIS — E785 Hyperlipidemia, unspecified: Secondary | ICD-10-CM | POA: Diagnosis not present

## 2018-08-23 DIAGNOSIS — E1142 Type 2 diabetes mellitus with diabetic polyneuropathy: Secondary | ICD-10-CM

## 2018-08-23 DIAGNOSIS — M5442 Lumbago with sciatica, left side: Secondary | ICD-10-CM

## 2018-08-23 DIAGNOSIS — G629 Polyneuropathy, unspecified: Secondary | ICD-10-CM

## 2018-08-23 DIAGNOSIS — Z1211 Encounter for screening for malignant neoplasm of colon: Secondary | ICD-10-CM

## 2018-08-23 DIAGNOSIS — N401 Enlarged prostate with lower urinary tract symptoms: Secondary | ICD-10-CM

## 2018-08-23 DIAGNOSIS — Z1212 Encounter for screening for malignant neoplasm of rectum: Secondary | ICD-10-CM

## 2018-08-23 DIAGNOSIS — F3342 Major depressive disorder, recurrent, in full remission: Secondary | ICD-10-CM | POA: Diagnosis not present

## 2018-08-23 DIAGNOSIS — M5441 Lumbago with sciatica, right side: Secondary | ICD-10-CM | POA: Diagnosis not present

## 2018-08-23 DIAGNOSIS — K219 Gastro-esophageal reflux disease without esophagitis: Secondary | ICD-10-CM | POA: Diagnosis not present

## 2018-08-23 DIAGNOSIS — G8929 Other chronic pain: Secondary | ICD-10-CM

## 2018-08-23 DIAGNOSIS — F5101 Primary insomnia: Secondary | ICD-10-CM

## 2018-08-23 DIAGNOSIS — N529 Male erectile dysfunction, unspecified: Secondary | ICD-10-CM | POA: Diagnosis not present

## 2018-08-23 DIAGNOSIS — R3911 Hesitancy of micturition: Secondary | ICD-10-CM

## 2018-08-23 LAB — BAYER DCA HB A1C WAIVED: HB A1C: 7.1 % — AB (ref <7.0)

## 2018-08-23 MED ORDER — ZOLPIDEM TARTRATE 10 MG PO TABS: 10.0000 mg | ORAL_TABLET | Freq: Every evening | ORAL | 3 refills | Status: DC | PRN

## 2018-08-23 MED ORDER — METFORMIN HCL 1000 MG PO TABS: 1000.0000 mg | ORAL_TABLET | Freq: Two times a day (BID) | ORAL | 1 refills | Status: DC

## 2018-08-23 MED ORDER — BLOOD GLUCOSE MONITOR KIT: PACK | 0 refills | Status: DC

## 2018-08-23 MED ORDER — EZETIMIBE 10 MG PO TABS: 10.0000 mg | ORAL_TABLET | Freq: Every day | ORAL | 1 refills | Status: DC

## 2018-08-23 MED ORDER — SERTRALINE HCL 100 MG PO TABS: ORAL_TABLET | ORAL | 1 refills | Status: DC

## 2018-08-23 MED ORDER — TELMISARTAN-HCTZ 40-12.5 MG PO TABS: 1.0000 | ORAL_TABLET | Freq: Every day | ORAL | 1 refills | Status: DC

## 2018-08-23 MED ORDER — TADALAFIL 5 MG PO TABS: 5.0000 mg | ORAL_TABLET | Freq: Every day | ORAL | 3 refills | Status: DC | PRN

## 2018-08-23 MED ORDER — SIMVASTATIN 40 MG PO TABS: 40.0000 mg | ORAL_TABLET | Freq: Every day | ORAL | 1 refills | Status: DC

## 2018-08-23 MED ORDER — GABAPENTIN 300 MG PO CAPS: 300.0000 mg | ORAL_CAPSULE | Freq: Two times a day (BID) | ORAL | 0 refills | Status: DC

## 2018-08-23 MED ORDER — ESOMEPRAZOLE MAGNESIUM 40 MG PO CPDR: 40.0000 mg | DELAYED_RELEASE_CAPSULE | Freq: Every day | ORAL | 1 refills | Status: DC

## 2018-08-23 NOTE — Patient Instructions (Signed)
   Your doctor has prescribed Cologuard, an easy-to-use, noninvasive test for colon cancer screening, based on the latest advances in stool DNA science.   Here's what will happen next:  1. You may receive a call or email from [facility] to confirm your mailing address and insurance information 2. Your kit will be shipped directly to you 3. You collet your stool sample in the privacy of your own home 4. You return the kit via [employer] prepaid shipping or pick-up, in the same box it arrived in 5. You doctor will contact you with the results once they are available  Screening for colon cancer is very important to your good health, so if you have any questions at all, please call [facility] Customer Support Specialists at [phone]. They are available 24 hours a day, 6 days a week.    

## 2018-08-23 NOTE — Progress Notes (Signed)
Subjective:    Patient ID: [name], male    DOB: [birth year], 59 y.o.   MRN: [id number]   Chief Complaint: Medical Management of Chronic Issues   HPI:  1. Essential hypertension  No c/o chest pain, sob or headache. Does not check blood pressures at home. BP Readings from Last 3 Encounters:  10/17/17 113/79  05/05/17 101/70  01/31/17 96/70     2. Gastroesophageal reflux disease without esophagitis  Takes nexium daily- says working well to keep symptoms under control.  3. Type 2 diabetes mellitus with diabetic polyneuropathy, without long-term current use of insulin ([place]) last HGBA1c was 6.4- has not been checked since last November. Blood sugars run between 150-180.  4. Neuropathy  Burning in feet occasionally- the gabapentin works well.  5. Primary insomnia  Cannot sleep without taking ambien. Feels rested in mornings when he takes [place].  6. Hyperlipidemia with target LDL less than 100  Tries to watch diet but does not exercise like he should. Takes zetia and zocor daily ithout any c/o myalgia.  7. Recurrent major depressive disorder, in full remission ([place])  Is currently on zoloft daily. Depression screen [facility] 2/9 08/23/2018 10/17/2017 10/17/2017 05/05/2017 01/31/2017  Decreased Interest 1 2 0 1 0  Down, Depressed, Hopeless 1 1 0 1 1  PHQ - 2 Score 2 3 0 2 1  Altered sleeping 1 1 - 0 -  Tired, decreased energy 1 1 - 1 -  Change in appetite 2 2 - 1 -  Feeling bad or failure about yourself  2 0 - 0 -  Trouble concentrating 2 1 - 0 -  Moving slowly or fidgety/restless 0 0 - 0 -  Suicidal thoughts 0 0 - 0 -  PHQ-9 Score 10 8 - 4 -     8.      erectille dysfunction         Use to take  cialis daily which  worked well for him. Would like to go         back to taking it. He was also taking it for urinary symptoms.  Outpatient Encounter Medications as of 08/23/2018  Medication Sig  . diazepam (VALIUM) 5 MG tablet TAKE 1 TO 2 TABLETS BY MOUTH EVERY 6 HOURS AS NEEDED  .  esomeprazole (NEXIUM) 40 MG capsule Take 1 capsule (40 mg total) daily at 12 noon by mouth.  . ezetimibe (ZETIA) 10 MG tablet Take 1 tablet (10 mg total) daily by mouth.  . fish oil-omega-3 fatty acids 1000 MG capsule Take 1 g by mouth 2 (two) times daily.  . fluticasone (FLONASE) 50 MCG/ACT nasal spray USE 1 SPRAY IN EACH NOSTRIL ONCE DAILY  . gabapentin (NEURONTIN) 300 MG capsule Take 1 capsule (300 mg total) by mouth 2 (two) times daily.  Marland Kitchen glucose blood (ONE TOUCH ULTRA TEST) test strip Test 1 x per day and prn-- dx 250.01  . hydrocortisone (ANUSOL-HC) 2.5 % rectal cream Place 1 application rectally 2 (two) times daily.  Marland Kitchen loratadine (CLARITIN) 10 MG tablet TAKE ONE (1) TABLET EACH DAY  . meloxicam (MOBIC) 15 MG tablet Take 1 tablet (15 mg total) by mouth daily. with food  . metFORMIN (GLUCOPHAGE) 1000 MG tablet TAKE 1 TABLET BY MOUTH TWICE DAILY WITH MEALS  . Multiple Vitamin (MULTIVITAMIN WITH MINERALS) TABS Take 1 tablet by mouth daily.  Marland Kitchen olmesartan-hydrochlorothiazide (BENICAR HCT) 40-25 MG tablet TAKE ONE (1) TABLET EACH DAY  . olopatadine (PATANOL) 0.1 % ophthalmic solution PLACE  1 DROP IN [facility] EYE TWICE DAILY  . sertraline (ZOLOFT) 100 MG tablet 2 po qd  . simvastatin (ZOCOR) 40 MG tablet Take 1 tablet (40 mg total) at bedtime by mouth.  . tadalafil (CIALIS) 5 MG tablet Take 1 tablet (5 mg total) by mouth daily as needed for erectile dysfunction.  . tamsulosin (FLOMAX) 0.4 MG CAPS capsule Take 1 capsule (0.4 mg total) by mouth daily.  Marland Kitchen telmisartan-hydrochlorothiazide (MICARDIS HCT) 40-12.5 MG tablet Take 1 tablet by mouth daily.  Marland Kitchen zolpidem (AMBIEN) 10 MG tablet Take 1 tablet (10 mg total) by mouth at bedtime as needed.     New complaints: None today  Social history: disabilty for back issues.   Review of Systems  Constitutional: Negative for activity change and appetite change.  HENT: Negative.   Eyes: Negative for pain.  Respiratory: Negative for shortness of breath.     Cardiovascular: Negative for chest pain, palpitations and leg swelling.  Gastrointestinal: Negative for abdominal pain.  Endocrine: Negative for polydipsia.  Genitourinary: Negative.   Skin: Negative for rash.  Neurological: Negative for dizziness, weakness and headaches.  Hematological: Does not bruise/bleed easily.  Psychiatric/Behavioral: Negative.   All other systems reviewed and are negative.      Objective:   Physical Exam  Constitutional: He is oriented to person, place, and time. He appears well-developed and well-nourished.  HENT:  Head: Normocephalic.  Nose: Nose normal.  Mouth/Throat: Oropharynx is clear and moist.  Eyes: Pupils are equal, round, and reactive to light. EOM are normal.  Neck: Normal range of motion and phonation normal. Neck supple. No JVD present. Carotid bruit is not present. No thyroid mass and no thyromegaly present.  Cardiovascular: Normal rate and regular rhythm.  Pulmonary/Chest: Effort normal and breath sounds normal. No respiratory distress.  Abdominal: Soft. Normal appearance, normal aorta and bowel sounds are normal. There is no tenderness.  Musculoskeletal: Normal range of motion.  Lymphadenopathy:    He has no cervical adenopathy.  Neurological: He is alert and oriented to person, place, and time.  Skin: Skin is warm and dry.  Psychiatric: He has a normal mood and affect. His behavior is normal. Judgment and thought content normal.  Nursing note and vitals reviewed.   BP 130/86   Pulse 88   Temp 98 F (36.7 C) (Oral)   Ht '5\' 10"'  (1.778 m)   Wt 230 lb 9.6 oz (104.6 kg)   BMI 33.09 kg/m   hgba1c 7.1     Assessment & Plan:  [name] comes in today with chief complaint of Medical Management of Chronic Issues   Diagnosis and orders addressed:  1. Essential hypertension Low sodium diet - CMP14+EGFR - Microalbumin / creatinine urine ratio - telmisartan-hydrochlorothiazide (MICARDIS HCT) 40-12.5 MG tablet; Take 1 tablet by  mouth daily.  Dispense: 90 tablet; Refill: 1  2. Gastroesophageal reflux disease without esophagitis Avoid spicy foods Do not eat 2 hours prior to bedtime - esomeprazole (NEXIUM) 40 MG capsule; Take 1 capsule (40 mg total) by mouth daily at 12 noon.  Dispense: 90 capsule; Refill: 1  3. Type 2 diabetes mellitus with diabetic polyneuropathy, without long-term current use of insulin (HCC) Continue low carb diet - Bayer DCA Hb A1c Waived - Microalbumin / creatinine urine ratio - blood glucose meter kit and supplies KIT; Dispense based on patient and insurance preference. Use up to four times daily as directed. (FOR ICD-9 250.00, 250.01).  Dispense: 1 each; Refill: 0 - metFORMIN (GLUCOPHAGE) 1000 MG tablet;  Take 1 tablet (1,000 mg total) by mouth 2 (two) times daily with a meal.  Dispense: 180 tablet; Refill: 1  4. Neuropathy Do not go barefooted  5. Primary insomnia Bedtime routine - zolpidem (AMBIEN) 10 MG tablet; Take 1 tablet (10 mg total) by mouth at bedtime as needed.  Dispense: 30 tablet; Refill: 3  6. Hyperlipidemia with target LDL less than 100 Low fat diet - Lipid panel - esomeprazole (NEXIUM) 40 MG capsule; Take 1 capsule (40 mg total) by mouth daily at 12 noon.  Dispense: 90 capsule; Refill: 1 - simvastatin (ZOCOR) 40 MG tablet; Take 1 tablet (40 mg total) by mouth at bedtime.  Dispense: 90 tablet; Refill: 1 - ezetimibe (ZETIA) 10 MG tablet; Take 1 tablet (10 mg total) by mouth daily.  Dispense: 90 tablet; Refill: 1  7. Recurrent major depressive disorder, in full remission ([place]) stress management - sertraline (ZOLOFT) 100 MG tablet; 2 po qd  Dispense: 180 tablet; Refill: 1  8. Chronic bilateral low back pain with bilateral sciatica - gabapentin (NEURONTIN) 300 MG capsule; Take 1 capsule (300 mg total) by mouth 2 (two) times daily.  Dispense: 30 capsule; Refill: 0  9. Erectile dysfunction of organic origin - tadalafil (CIALIS) 5 MG tablet; Take 1 tablet (5 mg total) by  mouth daily as needed for erectile dysfunction.  Dispense: 90 tablet; Refill: 3  10. Benign prostatic hyperplasia with urinary hesitancy - tadalafil (CIALIS) 5 MG tablet; Take 1 tablet (5 mg total) by mouth daily as needed for erectile dysfunction.  Dispense: 90 tablet; Refill: 3  11. Screening for colorectal cancer - Cologuard   Labs pending Health Maintenance reviewed Diet and exercise encouraged  Follow up plan: 3 months   [place], FNP

## 2018-08-24 ENCOUNTER — Other Ambulatory Visit: Payer: Self-pay

## 2018-08-24 LAB — CMP14+EGFR
A/G RATIO: 2.3 — AB (ref 1.2–2.2)
ALBUMIN: 4.8 g/dL (ref 3.5–5.5)
ALT: 50 IU/L — ABNORMAL HIGH (ref 0–44)
AST: 49 IU/L — ABNORMAL HIGH (ref 0–40)
Alkaline Phosphatase: 41 IU/L (ref 39–117)
BILIRUBIN TOTAL: 0.4 mg/dL (ref 0.0–1.2)
BUN / CREAT RATIO: 18 (ref 9–20)
BUN: 22 mg/dL (ref 6–24)
CHLORIDE: 103 mmol/L (ref 96–106)
CO2: 23 mmol/L (ref 20–29)
Calcium: 9.6 mg/dL (ref 8.7–10.2)
Creatinine, Ser: 1.21 mg/dL (ref 0.76–1.27)
GFR calc Af Amer: 75 mL/min/{1.73_m2} (ref >59)
GFR calc non Af Amer: 65 mL/min/{1.73_m2} (ref >59)
GLOBULIN, TOTAL: 2.1 g/dL (ref 1.5–4.5)
Glucose: 201 mg/dL — ABNORMAL HIGH (ref 65–99)
POTASSIUM: 4.5 mmol/L (ref 3.5–5.2)
Sodium: 139 mmol/L (ref 134–144)
TOTAL PROTEIN: 6.9 g/dL (ref 6.0–8.5)

## 2018-08-24 LAB — MICROALBUMIN / CREATININE URINE RATIO
Creatinine, Urine: 249.5 mg/dL
MICROALB/CREAT RATIO: 7.9 mg/g{creat} (ref 0.0–30.0)
Microalbumin, Urine: 19.7 ug/mL

## 2018-08-24 LAB — LIPID PANEL
Chol/HDL Ratio: 6.7 ratio — ABNORMAL HIGH (ref 0.0–5.0)
Cholesterol, Total: 273 mg/dL — ABNORMAL HIGH (ref 100–199)
HDL: 41 mg/dL (ref >39)
LDL Calculated: 171 mg/dL — ABNORMAL HIGH (ref 0–99)
Triglycerides: 303 mg/dL — ABNORMAL HIGH (ref 0–149)
VLDL Cholesterol Cal: 61 mg/dL — ABNORMAL HIGH (ref 5–40)

## 2018-08-24 MED ORDER — GABAPENTIN 300 MG PO CAPS: 300.0000 mg | ORAL_CAPSULE | Freq: Two times a day (BID) | ORAL | 1 refills | Status: DC

## 2018-08-24 NOTE — Addendum Note (Signed)
Addended by: Chevis Pretty on: 08/24/2018 03:50 PM   Modules accepted: Orders

## 2018-09-11 ENCOUNTER — Other Ambulatory Visit: Payer: Self-pay | Admitting: Nurse Practitioner

## 2018-09-11 DIAGNOSIS — E119 Type 2 diabetes mellitus without complications: Secondary | ICD-10-CM

## 2018-09-11 MED ORDER — GLUCOSE BLOOD VI STRP: ORAL_STRIP | 0 refills | Status: DC

## 2018-09-11 NOTE — Addendum Note (Signed)
Addended by: Antonietta Barcelona D on: 09/11/2018 11:22 AM   Modules accepted: Orders

## 2018-09-11 NOTE — Telephone Encounter (Signed)
RF failed, resent 

## 2018-09-19 ENCOUNTER — Other Ambulatory Visit: Payer: Self-pay | Admitting: Nurse Practitioner

## 2018-09-19 ENCOUNTER — Other Ambulatory Visit: Payer: Self-pay | Admitting: *Deleted

## 2018-09-20 MED ORDER — MELOXICAM 15 MG PO TABS: 15.0000 mg | ORAL_TABLET | Freq: Every day | ORAL | 1 refills | Status: DC

## 2018-09-20 MED ORDER — OLOPATADINE HCL 0.1 % OP SOLN: OPHTHALMIC | 2 refills | Status: DC

## 2018-09-27 ENCOUNTER — Telehealth: Payer: Self-pay | Admitting: Nurse Practitioner

## 2018-10-04 DIAGNOSIS — Z1211 Encounter for screening for malignant neoplasm of colon: Secondary | ICD-10-CM | POA: Diagnosis not present

## 2018-10-04 DIAGNOSIS — Z1212 Encounter for screening for malignant neoplasm of rectum: Secondary | ICD-10-CM | POA: Diagnosis not present

## 2018-10-11 LAB — COLOGUARD
Cologuard: NEGATIVE
Cologuard: NEGATIVE

## 2018-10-18 ENCOUNTER — Other Ambulatory Visit: Payer: Self-pay | Admitting: Nurse Practitioner

## 2018-10-25 ENCOUNTER — Ambulatory Visit: Payer: PPO

## 2018-10-31 ENCOUNTER — Telehealth: Payer: Self-pay | Admitting: Nurse Practitioner

## 2018-10-31 NOTE — Telephone Encounter (Signed)
Per Cyril Mourning cologuard was normal- patient aware.

## 2018-11-14 ENCOUNTER — Other Ambulatory Visit: Payer: Self-pay | Admitting: Nurse Practitioner

## 2018-11-16 ENCOUNTER — Encounter: Payer: Self-pay | Admitting: *Deleted

## 2018-11-28 ENCOUNTER — Encounter: Payer: Self-pay | Admitting: Nurse Practitioner

## 2018-11-28 ENCOUNTER — Ambulatory Visit (INDEPENDENT_AMBULATORY_CARE_PROVIDER_SITE_OTHER): Payer: PPO | Admitting: Nurse Practitioner

## 2018-11-28 VITALS — BP 134/84 | HR 82 | Temp 97.1°F | Ht 70.0 in | Wt 238.0 lb

## 2018-11-28 DIAGNOSIS — E1142 Type 2 diabetes mellitus with diabetic polyneuropathy: Secondary | ICD-10-CM

## 2018-11-28 DIAGNOSIS — Z23 Encounter for immunization: Secondary | ICD-10-CM | POA: Diagnosis not present

## 2018-11-28 DIAGNOSIS — K219 Gastro-esophageal reflux disease without esophagitis: Secondary | ICD-10-CM | POA: Diagnosis not present

## 2018-11-28 DIAGNOSIS — N401 Enlarged prostate with lower urinary tract symptoms: Secondary | ICD-10-CM

## 2018-11-28 DIAGNOSIS — M5442 Lumbago with sciatica, left side: Secondary | ICD-10-CM

## 2018-11-28 DIAGNOSIS — N529 Male erectile dysfunction, unspecified: Secondary | ICD-10-CM

## 2018-11-28 DIAGNOSIS — F3342 Major depressive disorder, recurrent, in full remission: Secondary | ICD-10-CM | POA: Diagnosis not present

## 2018-11-28 DIAGNOSIS — F5101 Primary insomnia: Secondary | ICD-10-CM

## 2018-11-28 DIAGNOSIS — E785 Hyperlipidemia, unspecified: Secondary | ICD-10-CM | POA: Diagnosis not present

## 2018-11-28 DIAGNOSIS — I10 Essential (primary) hypertension: Secondary | ICD-10-CM

## 2018-11-28 DIAGNOSIS — M5441 Lumbago with sciatica, right side: Secondary | ICD-10-CM | POA: Diagnosis not present

## 2018-11-28 DIAGNOSIS — R3911 Hesitancy of micturition: Secondary | ICD-10-CM | POA: Diagnosis not present

## 2018-11-28 DIAGNOSIS — G8929 Other chronic pain: Secondary | ICD-10-CM

## 2018-11-28 LAB — BAYER DCA HB A1C WAIVED: HB A1C (BAYER DCA - WAIVED): 9.5 % — ABNORMAL HIGH (ref <7.0)

## 2018-11-28 MED ORDER — MELOXICAM 15 MG PO TABS: 15.0000 mg | ORAL_TABLET | Freq: Every day | ORAL | 1 refills | Status: DC

## 2018-11-28 MED ORDER — TELMISARTAN-HCTZ 40-12.5 MG PO TABS: 1.0000 | ORAL_TABLET | Freq: Every day | ORAL | 1 refills | Status: DC

## 2018-11-28 MED ORDER — SIMVASTATIN 40 MG PO TABS: 40.0000 mg | ORAL_TABLET | Freq: Every day | ORAL | 1 refills | Status: DC

## 2018-11-28 MED ORDER — OLMESARTAN MEDOXOMIL-HCTZ 40-25 MG PO TABS: ORAL_TABLET | ORAL | 1 refills | Status: DC

## 2018-11-28 MED ORDER — ZOLPIDEM TARTRATE 10 MG PO TABS: 10.0000 mg | ORAL_TABLET | Freq: Every evening | ORAL | 5 refills | Status: DC | PRN

## 2018-11-28 MED ORDER — TADALAFIL 5 MG PO TABS: 5.0000 mg | ORAL_TABLET | Freq: Every day | ORAL | 1 refills | Status: DC | PRN

## 2018-11-28 MED ORDER — SERTRALINE HCL 100 MG PO TABS: ORAL_TABLET | ORAL | 1 refills | Status: DC

## 2018-11-28 MED ORDER — ZOLPIDEM TARTRATE 10 MG PO TABS: 10.0000 mg | ORAL_TABLET | Freq: Every evening | ORAL | 1 refills | Status: DC | PRN

## 2018-11-28 MED ORDER — ESOMEPRAZOLE MAGNESIUM 40 MG PO CPDR: 40.0000 mg | DELAYED_RELEASE_CAPSULE | Freq: Every day | ORAL | 1 refills | Status: DC

## 2018-11-28 MED ORDER — EZETIMIBE 10 MG PO TABS: 10.0000 mg | ORAL_TABLET | Freq: Every day | ORAL | 1 refills | Status: DC

## 2018-11-28 MED ORDER — GABAPENTIN 300 MG PO CAPS: 300.0000 mg | ORAL_CAPSULE | Freq: Two times a day (BID) | ORAL | 1 refills | Status: DC

## 2018-11-28 NOTE — Progress Notes (Signed)
Subjective:    Patient ID: [name], male    DOB: 06-May-1961, 57 y.o.   MRN: [id number]   Chief Complaint: medical management of chronic issues  HPI:  1. Type 2 diabetes mellitus with diabetic polyneuropathy, without long-term current use of insulin ([place]) last hgba1c was 7.1% . Fasting blood sugars have been running around 120-140. No hypoglycemic episodes  2. Essential hypertension  No c/o chest pain, sob or headache. Does not check blood pressure at home. BP Readings from Last 3 Encounters:  08/23/18 130/86  10/17/17 113/79  05/05/17 101/70     3. Hyperlipidemia with target LDL less than 100  Does not watch diet and does very little exercise.    Outpatient Encounter Medications as of 11/28/2018  Medication Sig  . blood glucose meter kit and supplies KIT Dispense based on patient and insurance preference. Use up to four times daily as directed. (FOR ICD-9 250.00, 250.01).  . diazepam (VALIUM) 5 MG tablet TAKE 1 TO 2 TABLETS BY MOUTH EVERY 6 HOURS AS NEEDED  . esomeprazole (NEXIUM) 40 MG capsule Take 1 capsule (40 mg total) by mouth daily at 12 noon.  . ezetimibe (ZETIA) 10 MG tablet Take 1 tablet (10 mg total) by mouth daily.  . fish oil-omega-3 fatty acids 1000 MG capsule Take 1 g by mouth 2 (two) times daily.  . fluticasone (FLONASE) 50 MCG/ACT nasal spray USE 1 SPRAY IN EACH NOSTRIL ONCE DAILY  . gabapentin (NEURONTIN) 300 MG capsule Take 1 capsule (300 mg total) by mouth 2 (two) times daily.  Marland Kitchen glucose blood (ONE TOUCH ULTRA TEST) test strip USE UP TO 4 TIMES DAILY AS DIRECTED   E11.9  . hydrocortisone (ANUSOL-HC) 2.5 % rectal cream Place 1 application rectally 2 (two) times daily.  Marland Kitchen loratadine (CLARITIN) 10 MG tablet TAKE ONE (1) TABLET EACH DAY  . meloxicam (MOBIC) 15 MG tablet Take 1 tablet (15 mg total) by mouth daily. with food  . metFORMIN (GLUCOPHAGE) 1000 MG tablet Take 1 tablet (1,000 mg total) by mouth 2 (two) times daily with a meal.  . Multiple Vitamin  (MULTIVITAMIN WITH MINERALS) TABS Take 1 tablet by mouth daily.  Marland Kitchen olmesartan-hydrochlorothiazide (BENICAR HCT) 40-25 MG tablet TAKE ONE (1) TABLET EACH DAY  . olopatadine (PATANOL) 0.1 % ophthalmic solution PLACE 1 DROP IN [facility] EYE TWICE DAILY  . ONETOUCH DELICA LANCETS FINE MISC TEST UP TO 4 TIMES DIALY  . sertraline (ZOLOFT) 100 MG tablet 2 po qd  . simvastatin (ZOCOR) 40 MG tablet Take 1 tablet (40 mg total) by mouth at bedtime.  . tadalafil (CIALIS) 5 MG tablet Take 1 tablet (5 mg total) by mouth daily as needed for erectile dysfunction.  Marland Kitchen telmisartan-hydrochlorothiazide (MICARDIS HCT) 40-12.5 MG tablet Take 1 tablet by mouth daily.  Marland Kitchen zolpidem (AMBIEN) 10 MG tablet Take 1 tablet (10 mg total) by mouth at bedtime as needed.       New complaints: None today  Social history: Has lots of back issues and is now on disabilty for back pain.   Review of Systems  Constitutional: Negative for activity change and appetite change.  HENT: Negative.   Eyes: Negative for pain.  Respiratory: Negative for shortness of breath.   Cardiovascular: Negative for chest pain, palpitations and leg swelling.  Gastrointestinal: Negative for abdominal pain.  Endocrine: Negative for polydipsia.  Genitourinary: Negative.   Musculoskeletal: Positive for back pain (chronic).  Skin: Negative for rash.  Neurological: Negative for dizziness, weakness and headaches.  Hematological: Does not bruise/bleed easily.  Psychiatric/Behavioral: Negative.   All other systems reviewed and are negative.      Objective:   Physical Exam Vitals signs and nursing note reviewed.  Constitutional:      Appearance: Normal appearance. He is well-developed.  HENT:     Head: Normocephalic.     Nose: Nose normal.  Eyes:     Pupils: Pupils are equal, round, and reactive to light.  Neck:     Musculoskeletal: Normal range of motion and neck supple.     Thyroid: No thyroid mass or thyromegaly.     Vascular: No carotid  bruit or JVD.     Trachea: Phonation normal.  Cardiovascular:     Rate and Rhythm: Normal rate and regular rhythm.  Pulmonary:     Effort: Pulmonary effort is normal. No respiratory distress.     Breath sounds: Normal breath sounds.  Abdominal:     General: Bowel sounds are normal.     Palpations: Abdomen is soft.     Tenderness: There is no abdominal tenderness.  Musculoskeletal: Normal range of motion.  Lymphadenopathy:     Cervical: No cervical adenopathy.  Skin:    General: Skin is warm and dry.  Neurological:     Mental Status: He is alert and oriented to person, place, and time.  Psychiatric:        Behavior: Behavior normal.        Thought Content: Thought content normal.        Judgment: Judgment normal.    BP 134/84   Pulse 82   Temp (!) 97.1 F (36.2 C) (Oral)   Ht '5\' 10"'  (1.778 m)   Wt 238 lb (108 kg)   BMI 34.15 kg/m    hgba1c - 9.5%     Assessment & Plan:  [name] comes in today with chief complaint of Medical Management of Chronic Issues   Diagnosis and orders addressed:  1. Type 2 diabetes mellitus with diabetic polyneuropathy, without long-term current use of insulin (HCC) stricer carb counting Added farxiga 14m daily- samples given - Bayer DCA Hb A1c Waived  2. Essential hypertension Low sodium diet - CMP14+EGFR - telmisartan-hydrochlorothiazide (MICARDIS HCT) 40-12.5 MG tablet; Take 1 tablet by mouth daily.  Dispense: 90 tablet; Refill: 1 - olmesartan-hydrochlorothiazide (BENICAR HCT) 40-25 MG tablet; TAKE ONE (1) TABLET EACH DAY  Dispense: 90 tablet; Refill: 1  3. Hyperlipidemia with target LDL less than 100 Low fat diet - Lipid panel - esomeprazole (NEXIUM) 40 MG capsule; Take 1 capsule (40 mg total) by mouth daily at 12 noon.  Dispense: 90 capsule; Refill: 1 - simvastatin (ZOCOR) 40 MG tablet; Take 1 tablet (40 mg total) by mouth at bedtime.  Dispense: 90 tablet; Refill: 1 - ezetimibe (ZETIA) 10 MG tablet; Take 1 tablet (10 mg  total) by mouth daily.  Dispense: 90 tablet; Refill: 1  4. Erectile dysfunction of organic origin - tadalafil (CIALIS) 5 MG tablet; Take 1 tablet (5 mg total) by mouth daily as needed for erectile dysfunction.  Dispense: 90 tablet; Refill: 1  5. Benign prostatic hyperplasia with urinary hesitancy - tadalafil (CIALIS) 5 MG tablet; Take 1 tablet (5 mg total) by mouth daily as needed for erectile dysfunction.  Dispense: 90 tablet; Refill: 1  6. Chronic bilateral low back pain with bilateral sciatica Back stretches - gabapentin (NEURONTIN) 300 MG capsule; Take 1 capsule (300 mg total) by mouth 2 (two) times daily.  Dispense: 180 capsule; Refill: 1  7. Gastroesophageal reflux disease without esophagitis Avoid spicy foods Do not eat 2 hours prior to bedtime - esomeprazole (NEXIUM) 40 MG capsule; Take 1 capsule (40 mg total) by mouth daily at 12 noon.  Dispense: 90 capsule; Refill: 1  8. Primary insomnia Bedtime routine - zolpidem (AMBIEN) 10 MG tablet; Take 1 tablet (10 mg total) by mouth at bedtime as needed.  Dispense: 90 tablet; Refill: 1  9. Recurrent major depressive disorder, in full remission ([place]) Stress management - sertraline (ZOLOFT) 100 MG tablet; 2 po qd  Dispense: 180 tablet; Refill: 1   Labs pending Health Maintenance reviewed Diet and exercise encouraged  Follow up plan: 1 month follow up diabtets only.   [name], FNP

## 2018-11-28 NOTE — Patient Instructions (Signed)
Diabetes Mellitus and Nutrition When you have diabetes (diabetes mellitus), it is very important to have healthy eating habits because your blood sugar (glucose) levels are greatly affected by what you eat and drink. Eating healthy foods in the appropriate amounts, at about the same times every day, can help you:  Control your blood glucose.  Lower your risk of heart disease.  Improve your blood pressure.  Reach or maintain a healthy weight.  Every person with diabetes is different, and each person has different needs for a meal plan. Your health care provider may recommend that you work with a diet and nutrition specialist (dietitian) to make a meal plan that is best for you. Your meal plan may vary depending on factors such as:  The calories you need.  The medicines you take.  Your weight.  Your blood glucose, blood pressure, and cholesterol levels.  Your activity level.  Other health conditions you have, such as heart or kidney disease.  How do carbohydrates affect me? Carbohydrates affect your blood glucose level more than any other type of food. Eating carbohydrates naturally increases the amount of glucose in your blood. Carbohydrate counting is a method for keeping track of how many carbohydrates you eat. Counting carbohydrates is important to keep your blood glucose at a healthy level, especially if you use insulin or take certain oral diabetes medicines. It is important to know how many carbohydrates you can safely have in each meal. This is different for every person. Your dietitian can help you calculate how many carbohydrates you should have at each meal and for snack. Foods that contain carbohydrates include:  Bread, cereal, rice, pasta, and crackers.  Potatoes and corn.  Peas, beans, and lentils.  Milk and yogurt.  Fruit and juice.  Desserts, such as cakes, cookies, ice cream, and candy.  How does alcohol affect me? Alcohol can cause a sudden decrease in blood  glucose (hypoglycemia), especially if you use insulin or take certain oral diabetes medicines. Hypoglycemia can be a life-threatening condition. Symptoms of hypoglycemia (sleepiness, dizziness, and confusion) are similar to symptoms of having too much alcohol. If your health care provider says that alcohol is safe for you, follow these guidelines:  Limit alcohol intake to no more than 1 drink per day for nonpregnant women and 2 drinks per day for men. One drink equals 12 oz of beer, 5 oz of wine, or 1 oz of hard liquor.  Do not drink on an empty stomach.  Keep yourself hydrated with water, diet soda, or unsweetened iced tea.  Keep in mind that regular soda, juice, and other mixers may contain a lot of sugar and must be counted as carbohydrates.  What are tips for following this plan? Reading food labels  Start by checking the serving size on the label. The amount of calories, carbohydrates, fats, and other nutrients listed on the label are based on one serving of the food. Many foods contain more than one serving per package.  Check the total grams (g) of carbohydrates in one serving. You can calculate the number of servings of carbohydrates in one serving by dividing the total carbohydrates by 15. For example, if a food has 30 g of total carbohydrates, it would be equal to 2 servings of carbohydrates.  Check the number of grams (g) of saturated and trans fats in one serving. Choose foods that have low or no amount of these fats.  Check the number of milligrams (mg) of sodium in one serving. Most people   should limit total sodium intake to less than 2,300 mg per day.  Always check the nutrition information of foods labeled as "low-fat" or "nonfat". These foods may be higher in added sugar or refined carbohydrates and should be avoided.  Talk to your dietitian to identify your daily goals for nutrients listed on the label. Shopping  Avoid buying canned, premade, or processed foods. These  foods tend to be high in fat, sodium, and added sugar.  Shop around the outside edge of the grocery store. This includes fresh fruits and vegetables, bulk grains, fresh meats, and fresh dairy. Cooking  Use low-heat cooking methods, such as baking, instead of high-heat cooking methods like deep frying.  Cook using healthy oils, such as olive, canola, or sunflower oil.  Avoid cooking with butter, cream, or high-fat meats. Meal planning  Eat meals and snacks regularly, preferably at the same times every day. Avoid going long periods of time without eating.  Eat foods high in fiber, such as fresh fruits, vegetables, beans, and whole grains. Talk to your dietitian about how many servings of carbohydrates you can eat at each meal.  Eat 4-6 ounces of lean protein each day, such as lean meat, chicken, fish, eggs, or tofu. 1 ounce is equal to 1 ounce of meat, chicken, or fish, 1 egg, or 1/4 cup of tofu.  Eat some foods each day that contain healthy fats, such as avocado, nuts, seeds, and fish. Lifestyle   Check your blood glucose regularly.  Exercise at least 30 minutes 5 or more days each week, or as told by your health care provider.  Take medicines as told by your health care provider.  Do not use any products that contain nicotine or tobacco, such as cigarettes and e-cigarettes. If you need help quitting, ask your health care provider.  Work with a counselor or diabetes educator to identify strategies to manage stress and any emotional and social challenges. What are some questions to ask my health care provider?  Do I need to meet with a diabetes educator?  Do I need to meet with a dietitian?  What number can I call if I have questions?  When are the best times to check my blood glucose? Where to find more information:  American Diabetes Association: diabetes.org/food-and-fitness/food  Academy of Nutrition and Dietetics:  www.eatright.org/resources/health/diseases-and-conditions/diabetes  National Institute of Diabetes and Digestive and Kidney Diseases (NIH): www.niddk.nih.gov/health-information/diabetes/overview/diet-eating-physical-activity Summary  A healthy meal plan will help you control your blood glucose and maintain a healthy lifestyle.  Working with a diet and nutrition specialist (dietitian) can help you make a meal plan that is best for you.  Keep in mind that carbohydrates and alcohol have immediate effects on your blood glucose levels. It is important to count carbohydrates and to use alcohol carefully. This information is not intended to replace advice given to you by your health care provider. Make sure you discuss any questions you have with your health care provider. Document Released: 08/26/2005 Document Revised: 01/03/2017 Document Reviewed: 01/03/2017 Elsevier Interactive Patient Education  2018 [employer]  

## 2018-11-28 NOTE — Addendum Note (Signed)
Addended by: Chevis Pretty on: 11/28/2018 09:49 AM   Modules accepted: Orders

## 2018-11-29 LAB — LIPID PANEL
CHOL/HDL RATIO: 4 ratio (ref 0.0–5.0)
Cholesterol, Total: 144 mg/dL (ref 100–199)
HDL: 36 mg/dL — ABNORMAL LOW (ref >39)
LDL Calculated: 77 mg/dL (ref 0–99)
Triglycerides: 157 mg/dL — ABNORMAL HIGH (ref 0–149)
VLDL CHOLESTEROL CAL: 31 mg/dL (ref 5–40)

## 2018-11-29 LAB — CMP14+EGFR
A/G RATIO: 2.4 — AB (ref 1.2–2.2)
ALBUMIN: 4.5 g/dL (ref 3.5–5.5)
ALT: 35 IU/L (ref 0–44)
AST: 40 IU/L (ref 0–40)
Alkaline Phosphatase: 43 IU/L (ref 39–117)
BUN / CREAT RATIO: 16 (ref 9–20)
BUN: 17 mg/dL (ref 6–24)
Bilirubin Total: 0.3 mg/dL (ref 0.0–1.2)
CALCIUM: 9.1 mg/dL (ref 8.7–10.2)
CHLORIDE: 100 mmol/L (ref 96–106)
CO2: 19 mmol/L — ABNORMAL LOW (ref 20–29)
Creatinine, Ser: 1.07 mg/dL (ref 0.76–1.27)
GFR, EST AFRICAN AMERICAN: 87 mL/min/{1.73_m2} (ref >59)
GFR, EST NON AFRICAN AMERICAN: 76 mL/min/{1.73_m2} (ref >59)
Globulin, Total: 1.9 g/dL (ref 1.5–4.5)
Glucose: 254 mg/dL — ABNORMAL HIGH (ref 65–99)
Potassium: 4.4 mmol/L (ref 3.5–5.2)
Sodium: 140 mmol/L (ref 134–144)
TOTAL PROTEIN: 6.4 g/dL (ref 6.0–8.5)

## 2018-12-12 ENCOUNTER — Other Ambulatory Visit: Payer: Self-pay | Admitting: Nurse Practitioner

## 2018-12-29 ENCOUNTER — Ambulatory Visit (INDEPENDENT_AMBULATORY_CARE_PROVIDER_SITE_OTHER): Payer: PPO | Admitting: Nurse Practitioner

## 2018-12-29 ENCOUNTER — Encounter: Payer: Self-pay | Admitting: Nurse Practitioner

## 2018-12-29 VITALS — BP 102/70 | HR 70 | Temp 97.0°F | Ht 70.0 in | Wt 226.0 lb

## 2018-12-29 DIAGNOSIS — E1142 Type 2 diabetes mellitus with diabetic polyneuropathy: Secondary | ICD-10-CM

## 2018-12-29 LAB — BAYER DCA HB A1C WAIVED: HB A1C (BAYER DCA - WAIVED): 8 % — ABNORMAL HIGH (ref <7.0)

## 2018-12-29 NOTE — Patient Instructions (Signed)
Carbohydrate Counting for Diabetes Mellitus, Adult  Carbohydrate counting is a method of keeping track of how many carbohydrates you eat. Eating carbohydrates naturally increases the amount of sugar (glucose) in the blood. Counting how many carbohydrates you eat helps keep your blood glucose within normal limits, which helps you manage your diabetes (diabetes mellitus). It is important to know how many carbohydrates you can safely have in each meal. This is different for every person. A diet and nutrition specialist (registered dietitian) can help you make a meal plan and calculate how many carbohydrates you should have at each meal and snack. Carbohydrates are found in the following foods:  Grains, such as breads and cereals.  Dried beans and soy products.  Starchy vegetables, such as potatoes, peas, and corn.  Fruit and fruit juices.  Milk and yogurt.  Sweets and snack foods, such as cake, cookies, candy, chips, and soft drinks. How do I count carbohydrates? There are two ways to count carbohydrates in food. You can use either of the methods or a combination of both. Reading "Nutrition Facts" on packaged food The "Nutrition Facts" list is included on the labels of almost all packaged foods and beverages in the [place] It includes:  The serving size.  Information about nutrients in each serving, including the grams (g) of carbohydrate per serving. To use the "Nutrition Facts":  Decide how many servings you will have.  Multiply the number of servings by the number of carbohydrates per serving.  The resulting number is the total amount of carbohydrates that you will be having. Learning standard serving sizes of other foods When you eat carbohydrate foods that are not packaged or do not include "Nutrition Facts" on the label, you need to measure the servings in order to count the amount of carbohydrates:  Measure the foods that you will eat with a food scale or measuring cup, if needed.   Decide how many standard-size servings you will eat.  Multiply the number of servings by 15. Most carbohydrate-rich foods have about 15 g of carbohydrates per serving. ? For example, if you eat 8 oz (170 g) of strawberries, you will have eaten 2 servings and 30 g of carbohydrates (2 servings x 15 g = 30 g).  For foods that have more than one food mixed, such as soups and casseroles, you must count the carbohydrates in each food that is included. The following list contains standard serving sizes of common carbohydrate-rich foods. Each of these servings has about 15 g of carbohydrates:   hamburger bun or  English muffin.   oz (15 mL) syrup.   oz (14 g) jelly.  1 slice of bread.  1 six-inch tortilla.  3 oz (85 g) cooked rice or pasta.  4 oz (113 g) cooked dried beans.  4 oz (113 g) starchy vegetable, such as peas, corn, or potatoes.  4 oz (113 g) hot cereal.  4 oz (113 g) mashed potatoes or  of a large baked potato.  4 oz (113 g) canned or frozen fruit.  4 oz (120 mL) fruit juice.  4-6 crackers.  6 chicken nuggets.  6 oz (170 g) unsweetened dry cereal.  6 oz (170 g) plain fat-free yogurt or yogurt sweetened with artificial sweeteners.  8 oz (240 mL) milk.  8 oz (170 g) fresh fruit or one small piece of fruit.  24 oz (680 g) popped popcorn. Example of carbohydrate counting Sample meal  3 oz (85 g) chicken breast.  6 oz (170 g)   brown rice.  4 oz (113 g) corn.  8 oz (240 mL) milk.  8 oz (170 g) strawberries with sugar-free whipped topping. Carbohydrate calculation 1. Identify the foods that contain carbohydrates: ? Rice. ? Corn. ? Milk. ? Strawberries. 2. Calculate how many servings you have of each food: ? 2 servings rice. ? 1 serving corn. ? 1 serving milk. ? 1 serving strawberries. 3. Multiply each number of servings by 15 g: ? 2 servings rice x 15 g = 30 g. ? 1 serving corn x 15 g = 15 g. ? 1 serving milk x 15 g = 15 g. ? 1 serving  strawberries x 15 g = 15 g. 4. Add together all of the amounts to find the total grams of carbohydrates eaten: ? 30 g + 15 g + 15 g + 15 g = 75 g of carbohydrates total. Summary  Carbohydrate counting is a method of keeping track of how many carbohydrates you eat.  Eating carbohydrates naturally increases the amount of sugar (glucose) in the blood.  Counting how many carbohydrates you eat helps keep your blood glucose within normal limits, which helps you manage your diabetes.  A diet and nutrition specialist (registered dietitian) can help you make a meal plan and calculate how many carbohydrates you should have at each meal and snack. This information is not intended to replace advice given to you by your health care provider. Make sure you discuss any questions you have with your health care provider. Document Released: 11/29/2005 Document Revised: 06/08/2017 Document Reviewed: 05/12/2016 Elsevier Interactive Patient Education  2019 Elsevier Inc.  

## 2018-12-29 NOTE — Progress Notes (Signed)
   Subjective:    Patient ID: [name], male    DOB: [birth year], 58 y.o.   MRN: [id number]   Chief Complaint: diabetes  HPI Patient was seen on 11/12/18 with hgba1c of 9.5%. we added farxiga to his current meds. Blood sugars have been between 150-190 after he eats. He does not check before he eats.   Review of Systems  Constitutional: Negative.   Respiratory: Negative.   Cardiovascular: Negative.   Neurological: Negative.   Psychiatric/Behavioral: Negative.   All other systems reviewed and are negative.      Objective:   Physical Exam Constitutional:      Appearance: Normal appearance. He is normal weight.  Cardiovascular:     Rate and Rhythm: Normal rate and regular rhythm.     Heart sounds: Normal heart sounds.  Pulmonary:     Effort: Pulmonary effort is normal.     Breath sounds: Normal breath sounds.  Skin:    General: Skin is warm and dry.  Neurological:     General: No focal deficit present.     Mental Status: He is alert and oriented to person, place, and time.  Psychiatric:        Mood and Affect: Mood normal.        Behavior: Behavior normal.    BP 102/70   Pulse 70   Temp (!) 97 F (36.1 C) (Oral)   Ht 5\' 10"  (1.778 m)   Wt 226 lb (102.5 kg)   BMI 32.43 kg/m   hgba1c 8.0       Assessment & Plan:  [name] in today with chief complaint of Recheck Hgb A1C   1. Type 2 diabetes mellitus with diabetic polyneuropathy, without long-term current use of insulin (HCC) Continue all meds Watch crbs in diet and  Exercise RTO in 3 months for recheck - Bayer DCA Hb A1c Waived  [name], FNP

## 2019-01-01 ENCOUNTER — Telehealth: Payer: Self-pay | Admitting: Nurse Practitioner

## 2019-01-02 MED ORDER — DAPAGLIFLOZIN PROPANEDIOL 5 MG PO TABS: 5.0000 mg | ORAL_TABLET | Freq: Every day | ORAL | 1 refills | Status: DC

## 2019-01-02 NOTE — Telephone Encounter (Signed)
rx for farxiga sent o pharmacy

## 2019-02-11 ENCOUNTER — Other Ambulatory Visit: Payer: Self-pay | Admitting: Nurse Practitioner

## 2019-02-11 DIAGNOSIS — E1142 Type 2 diabetes mellitus with diabetic polyneuropathy: Secondary | ICD-10-CM

## 2019-03-30 ENCOUNTER — Other Ambulatory Visit: Payer: Self-pay

## 2019-03-30 ENCOUNTER — Ambulatory Visit (INDEPENDENT_AMBULATORY_CARE_PROVIDER_SITE_OTHER): Payer: PPO | Admitting: Nurse Practitioner

## 2019-03-30 ENCOUNTER — Encounter: Payer: Self-pay | Admitting: Nurse Practitioner

## 2019-03-30 DIAGNOSIS — E785 Hyperlipidemia, unspecified: Secondary | ICD-10-CM | POA: Diagnosis not present

## 2019-03-30 DIAGNOSIS — Z6832 Body mass index (BMI) 32.0-32.9, adult: Secondary | ICD-10-CM

## 2019-03-30 DIAGNOSIS — G629 Polyneuropathy, unspecified: Secondary | ICD-10-CM | POA: Diagnosis not present

## 2019-03-30 DIAGNOSIS — F3342 Major depressive disorder, recurrent, in full remission: Secondary | ICD-10-CM

## 2019-03-30 DIAGNOSIS — E1142 Type 2 diabetes mellitus with diabetic polyneuropathy: Secondary | ICD-10-CM

## 2019-03-30 DIAGNOSIS — F5101 Primary insomnia: Secondary | ICD-10-CM

## 2019-03-30 DIAGNOSIS — I10 Essential (primary) hypertension: Secondary | ICD-10-CM

## 2019-03-30 DIAGNOSIS — K219 Gastro-esophageal reflux disease without esophagitis: Secondary | ICD-10-CM | POA: Diagnosis not present

## 2019-03-30 MED ORDER — SERTRALINE HCL 100 MG PO TABS: ORAL_TABLET | ORAL | 1 refills | Status: DC

## 2019-03-30 MED ORDER — OLMESARTAN MEDOXOMIL-HCTZ 40-25 MG PO TABS: ORAL_TABLET | ORAL | 1 refills | Status: DC

## 2019-03-30 MED ORDER — ZOLPIDEM TARTRATE 10 MG PO TABS: 10.0000 mg | ORAL_TABLET | Freq: Every evening | ORAL | 1 refills | Status: DC | PRN

## 2019-03-30 MED ORDER — DAPAGLIFLOZIN PROPANEDIOL 5 MG PO TABS: 5.0000 mg | ORAL_TABLET | Freq: Every day | ORAL | 1 refills | Status: DC

## 2019-03-30 MED ORDER — EZETIMIBE 10 MG PO TABS: 10.0000 mg | ORAL_TABLET | Freq: Every day | ORAL | 1 refills | Status: DC

## 2019-03-30 MED ORDER — TELMISARTAN-HCTZ 40-12.5 MG PO TABS: 1.0000 | ORAL_TABLET | Freq: Every day | ORAL | 1 refills | Status: DC

## 2019-03-30 MED ORDER — METFORMIN HCL 1000 MG PO TABS: 1000.0000 mg | ORAL_TABLET | Freq: Two times a day (BID) | ORAL | 0 refills | Status: DC

## 2019-03-30 MED ORDER — ESOMEPRAZOLE MAGNESIUM 40 MG PO CPDR: 40.0000 mg | DELAYED_RELEASE_CAPSULE | Freq: Every day | ORAL | 1 refills | Status: DC

## 2019-03-30 MED ORDER — SIMVASTATIN 40 MG PO TABS: 40.0000 mg | ORAL_TABLET | Freq: Every day | ORAL | 1 refills | Status: DC

## 2019-03-30 NOTE — Progress Notes (Signed)
Patient ID: [name], male   DOB: [birth year], 58 y.o.   MRN: [id number]    Virtual Visit via telephone Note  I connected with [name] on 03/30/19 at 10:15 AM by telephone and verified that I am speaking with the correct person using two identifiers. [name] is currently located at home  and his wife is currently with her during visit. The provider, [name], FNP is located in their office at time of visit.  I discussed the limitations, risks, security and privacy concerns of performing an evaluation and management service by telephone and the availability of in person appointments. I also discussed with the patient that there may be a patient responsible charge related to this service. The patient expressed understanding and agreed to proceed.   History and Present Illness:   Chief Complaint: Medical Management of Chronic Issues    HPI:  1. Essential hypertension No c/o chest pain , sob or headache. Does not check blood pressure at home. BP Readings from Last 3 Encounters:  12/29/18 102/70  11/28/18 134/84  08/23/18 130/86     2. Type 2 diabetes mellitus with diabetic polyneuropathy, without long-term current use of insulin (HCC) Last HGBA1c was 8.0%. His fasting blood sugars have running 180-190. He has not been watching diet since this quarantine started.  3. Hyperlipidemia with target LDL less than 100 Does not watch diet and does no exercise  4. Recurrent major depressive disorder, in full remission ([place]) Is currently on zoloft which is working well for him  5. Primary insomnia Takes ambien nightly and works well for him  6. Gastroesophageal reflux disease without esophagitis Is on nexium daily and works well to keep symptoms under control  7. Neuropathy Mainly in feet- doing well right ow  8. BMI 32.0-32.9,adult No weight changes    Outpatient Encounter Medications as of 03/30/2019  Medication Sig  . blood glucose meter kit and  supplies KIT Dispense based on patient and insurance preference. Use up to four times daily as directed. (FOR ICD-9 250.00, 250.01).  . dapagliflozin propanediol (FARXIGA) 5 MG TABS tablet Take 5 mg by mouth daily.  . diazepam (VALIUM) 5 MG tablet TAKE 1 TO 2 TABLETS BY MOUTH EVERY 6 HOURS AS NEEDED  . esomeprazole (NEXIUM) 40 MG capsule Take 1 capsule (40 mg total) by mouth daily at 12 noon.  . ezetimibe (ZETIA) 10 MG tablet Take 1 tablet (10 mg total) by mouth daily.  . fish oil-omega-3 fatty acids 1000 MG capsule Take 1 g by mouth 2 (two) times daily.  . fluticasone (FLONASE) 50 MCG/ACT nasal spray USE 1 SPRAY IN EACH NOSTRIL ONCE DAILY  . gabapentin (NEURONTIN) 300 MG capsule Take 1 capsule (300 mg total) by mouth 2 (two) times daily.  Marland Kitchen glucose blood (ONE TOUCH ULTRA TEST) test strip USE UP TO 4 TIMES DAILY AS DIRECTED   E11.9  . hydrocortisone (ANUSOL-HC) 2.5 % rectal cream Place 1 application rectally 2 (two) times daily.  Marland Kitchen loratadine (CLARITIN) 10 MG tablet TAKE ONE (1) TABLET EACH DAY  . meloxicam (MOBIC) 15 MG tablet Take 1 tablet (15 mg total) by mouth daily. with food  . metFORMIN (GLUCOPHAGE) 1000 MG tablet TAKE 1 TABLET (1,000 MG TOTAL) BY MOUTH 2 (TWO) TIMES DAILY WITH A MEAL.  . Multiple Vitamin (MULTIVITAMIN WITH MINERALS) TABS Take 1 tablet by mouth daily.  Marland Kitchen olmesartan-hydrochlorothiazide (BENICAR HCT) 40-25 MG tablet TAKE ONE (1) TABLET EACH DAY  . olopatadine (PATANOL) 0.1 %  ophthalmic solution PLACE 1 DROP IN [facility] EYE TWICE DAILY  . ONETOUCH DELICA LANCETS FINE MISC TEST UP TO 4 TIMES DIALY  . sertraline (ZOLOFT) 100 MG tablet 2 po qd  . simvastatin (ZOCOR) 40 MG tablet Take 1 tablet (40 mg total) by mouth at bedtime.  . tadalafil (CIALIS) 5 MG tablet Take 1 tablet (5 mg total) by mouth daily as needed for erectile dysfunction.  Marland Kitchen telmisartan-hydrochlorothiazide (MICARDIS HCT) 40-12.5 MG tablet Take 1 tablet by mouth daily.  Marland Kitchen zolpidem (AMBIEN) 10 MG tablet Take 1 tablet  (10 mg total) by mouth at bedtime as needed.     New complaints: None today  Social history:  Is retired and staying home right now      Review of Systems  Constitutional: Negative for diaphoresis and weight loss.  Eyes: Negative for blurred vision, double vision and pain.  Respiratory: Negative for shortness of breath.   Cardiovascular: Negative for chest pain, palpitations, orthopnea and leg swelling.  Gastrointestinal: Negative for abdominal pain.  Skin: Negative for rash.  Neurological: Negative for dizziness, sensory change, loss of consciousness, weakness and headaches.  Endo/Heme/Allergies: Negative for polydipsia. Does not bruise/bleed easily.  Psychiatric/Behavioral: Negative for memory loss. The patient does not have insomnia.   All other systems reviewed and are negative.    Observations/Objective: alert and oriented- answers all questons appropriately No distress  Assessment and Plan: ARMISTEAD SULT comes in today with chief complaint of Medical Management of Chronic Issues   Diagnosis and orders addressed:  1. Essential hypertension Low sodium diet - telmisartan-hydrochlorothiazide (MICARDIS HCT) 40-12.5 MG tablet; Take 1 tablet by mouth daily.  Dispense: 90 tablet; Refill: 1 - olmesartan-hydrochlorothiazide (BENICAR HCT) 40-25 MG tablet; TAKE ONE (1) TABLET EACH DAY  Dispense: 90 tablet; Refill: 1  2. Type 2 diabetes mellitus with diabetic polyneuropathy, without long-term current use of insulin (HCC) Stricter carb counting - metFORMIN (GLUCOPHAGE) 1000 MG tablet; Take 1 tablet (1,000 mg total) by mouth 2 (two) times daily with a meal.  Dispense: 180 tablet; Refill: 0 - dapagliflozin propanediol (FARXIGA) 5 MG TABS tablet; Take 5 mg by mouth daily.  Dispense: 90 tablet; Refill: 1  3. Hyperlipidemia with target LDL less than 100 Low fat diet - esomeprazole (NEXIUM) 40 MG capsule; Take 1 capsule (40 mg total) by mouth daily at 12 noon.  Dispense: 90  capsule; Refill: 1 - simvastatin (ZOCOR) 40 MG tablet; Take 1 tablet (40 mg total) by mouth at bedtime.  Dispense: 90 tablet; Refill: 1 - ezetimibe (ZETIA) 10 MG tablet; Take 1 tablet (10 mg total) by mouth daily.  Dispense: 90 tablet; Refill: 1  4. Recurrent major depressive disorder, in full remission ([place]) Stress management - sertraline (ZOLOFT) 100 MG tablet; 2 po qd  Dispense: 180 tablet; Refill: 1  5. Primary insomnia Bedtime routine - zolpidem (AMBIEN) 10 MG tablet; Take 1 tablet (10 mg total) by mouth at bedtime as needed.  Dispense: 90 tablet; Refill: 1  6. Gastroesophageal reflux disease without esophagitis Avoid spicy foods Do not eat 2 hours prior to bedtime - esomeprazole (NEXIUM) 40 MG capsule; Take 1 capsule (40 mg total) by mouth daily at 12 noon.  Dispense: 90 capsule; Refill: 1  7. Neuropathy Do not go barefooted  8. BMI 32.0-32.9,adult Discussed diet and exercise for person with BMI >25 Will recheck weight in 3-6 months   Previous lab results reviewed Health Maintenance reviewed Diet and exercise encouraged  Follow up plan: 3 months  I discussed the assessment and treatment plan with the patient. The patient was provided an opportunity to ask questions and all were answered. The patient agreed with the plan and demonstrated an understanding of the instructions.   The patient was advised to call back or seek an in-person evaluation if the symptoms worsen or if the condition fails to improve as anticipated.  The above assessment and management plan was discussed with the patient. The patient verbalized understanding of and has agreed to the management plan. Patient is aware to call the clinic if symptoms persist or worsen. Patient is aware when to return to the clinic for a follow-up visit. Patient educated on when it is appropriate to go to the emergency department.    I provided 15 minutes of non-face-to-face time during this encounter.     [name], FNP

## 2019-04-04 DIAGNOSIS — M9903 Segmental and somatic dysfunction of lumbar region: Secondary | ICD-10-CM | POA: Diagnosis not present

## 2019-04-04 DIAGNOSIS — M9902 Segmental and somatic dysfunction of thoracic region: Secondary | ICD-10-CM | POA: Diagnosis not present

## 2019-04-04 DIAGNOSIS — S134XXA Sprain of ligaments of cervical spine, initial encounter: Secondary | ICD-10-CM | POA: Diagnosis not present

## 2019-04-04 DIAGNOSIS — S338XXA Sprain of other parts of lumbar spine and pelvis, initial encounter: Secondary | ICD-10-CM | POA: Diagnosis not present

## 2019-04-04 DIAGNOSIS — M546 Pain in thoracic spine: Secondary | ICD-10-CM | POA: Diagnosis not present

## 2019-04-04 DIAGNOSIS — M9901 Segmental and somatic dysfunction of cervical region: Secondary | ICD-10-CM | POA: Diagnosis not present

## 2019-04-09 DIAGNOSIS — M546 Pain in thoracic spine: Secondary | ICD-10-CM | POA: Diagnosis not present

## 2019-04-09 DIAGNOSIS — S134XXA Sprain of ligaments of cervical spine, initial encounter: Secondary | ICD-10-CM | POA: Diagnosis not present

## 2019-04-09 DIAGNOSIS — S338XXA Sprain of other parts of lumbar spine and pelvis, initial encounter: Secondary | ICD-10-CM | POA: Diagnosis not present

## 2019-04-09 DIAGNOSIS — M9903 Segmental and somatic dysfunction of lumbar region: Secondary | ICD-10-CM | POA: Diagnosis not present

## 2019-04-09 DIAGNOSIS — M9902 Segmental and somatic dysfunction of thoracic region: Secondary | ICD-10-CM | POA: Diagnosis not present

## 2019-04-09 DIAGNOSIS — M9901 Segmental and somatic dysfunction of cervical region: Secondary | ICD-10-CM | POA: Diagnosis not present

## 2019-05-06 ENCOUNTER — Other Ambulatory Visit: Payer: Self-pay | Admitting: Nurse Practitioner

## 2019-05-06 DIAGNOSIS — E1142 Type 2 diabetes mellitus with diabetic polyneuropathy: Secondary | ICD-10-CM

## 2019-05-24 ENCOUNTER — Other Ambulatory Visit: Payer: Self-pay | Admitting: Nurse Practitioner

## 2019-05-24 DIAGNOSIS — I10 Essential (primary) hypertension: Secondary | ICD-10-CM

## 2019-06-09 ENCOUNTER — Other Ambulatory Visit: Payer: Self-pay | Admitting: Nurse Practitioner

## 2019-06-09 DIAGNOSIS — E785 Hyperlipidemia, unspecified: Secondary | ICD-10-CM

## 2019-06-09 DIAGNOSIS — E1142 Type 2 diabetes mellitus with diabetic polyneuropathy: Secondary | ICD-10-CM

## 2019-06-09 DIAGNOSIS — F5101 Primary insomnia: Secondary | ICD-10-CM

## 2019-06-19 ENCOUNTER — Other Ambulatory Visit: Payer: Self-pay | Admitting: Nurse Practitioner

## 2019-06-19 ENCOUNTER — Telehealth: Payer: Self-pay

## 2019-06-19 DIAGNOSIS — F5101 Primary insomnia: Secondary | ICD-10-CM

## 2019-06-19 NOTE — Telephone Encounter (Signed)
Patients [name] was sent to the [facility] in [facility]. [facility] wanted [facility] to [facility] [place]. Can this be done?

## 2019-06-20 ENCOUNTER — Other Ambulatory Visit: Payer: Self-pay | Admitting: Nurse Practitioner

## 2019-06-20 ENCOUNTER — Telehealth: Payer: Self-pay | Admitting: Nurse Practitioner

## 2019-06-20 DIAGNOSIS — F5101 Primary insomnia: Secondary | ICD-10-CM

## 2019-06-20 NOTE — Telephone Encounter (Signed)
What is the name of the medication? zopidem  Have you contacted your pharmacy to request a refill? yes  Which pharmacy would you like this sent to? [facility]   Patient notified that their request is being sent to the clinical staff for review and that they should receive a call once it is complete. If they do not receive a call within 24 hours they can check with their pharmacy or our office.

## 2019-06-21 ENCOUNTER — Ambulatory Visit: Payer: PPO | Admitting: Nurse Practitioner

## 2019-06-21 NOTE — Telephone Encounter (Signed)
Patient aware he will need to be seen in office for a refill. Appt made

## 2019-06-21 NOTE — Telephone Encounter (Signed)
Sent to correct pharmacy per provider

## 2019-07-05 ENCOUNTER — Telehealth: Payer: Self-pay | Admitting: Nurse Practitioner

## 2019-07-05 ENCOUNTER — Other Ambulatory Visit: Payer: Self-pay | Admitting: Nurse Practitioner

## 2019-07-05 DIAGNOSIS — E1142 Type 2 diabetes mellitus with diabetic polyneuropathy: Secondary | ICD-10-CM

## 2019-07-05 DIAGNOSIS — G8929 Other chronic pain: Secondary | ICD-10-CM

## 2019-07-05 DIAGNOSIS — M5442 Lumbago with sciatica, left side: Secondary | ICD-10-CM

## 2019-07-05 MED ORDER — METFORMIN HCL 1000 MG PO TABS: 1000.0000 mg | ORAL_TABLET | Freq: Two times a day (BID) | ORAL | 0 refills | Status: DC

## 2019-07-05 NOTE — Telephone Encounter (Signed)
Rx sent to pharmacy and patient notified.   

## 2019-07-06 ENCOUNTER — Other Ambulatory Visit: Payer: Self-pay | Admitting: Nurse Practitioner

## 2019-07-06 DIAGNOSIS — E1142 Type 2 diabetes mellitus with diabetic polyneuropathy: Secondary | ICD-10-CM

## 2019-09-12 ENCOUNTER — Other Ambulatory Visit: Payer: Self-pay | Admitting: Nurse Practitioner

## 2019-09-12 DIAGNOSIS — I10 Essential (primary) hypertension: Secondary | ICD-10-CM

## 2019-09-14 ENCOUNTER — Telehealth: Payer: Self-pay | Admitting: Nurse Practitioner

## 2019-09-14 ENCOUNTER — Other Ambulatory Visit: Payer: Self-pay | Admitting: Nurse Practitioner

## 2019-09-14 DIAGNOSIS — E785 Hyperlipidemia, unspecified: Secondary | ICD-10-CM

## 2019-09-14 NOTE — Telephone Encounter (Signed)
[facility] [place] and it doesn't need a PA it was something to do with the [facility] and a brand they have. They said they would call and talk to pt about it.

## 2019-09-27 ENCOUNTER — Other Ambulatory Visit: Payer: Self-pay | Admitting: Nurse Practitioner

## 2019-09-27 DIAGNOSIS — E1142 Type 2 diabetes mellitus with diabetic polyneuropathy: Secondary | ICD-10-CM

## 2019-10-23 ENCOUNTER — Other Ambulatory Visit: Payer: Self-pay | Admitting: Nurse Practitioner

## 2019-10-23 DIAGNOSIS — E1142 Type 2 diabetes mellitus with diabetic polyneuropathy: Secondary | ICD-10-CM

## 2019-10-24 ENCOUNTER — Other Ambulatory Visit: Payer: Self-pay

## 2019-10-24 ENCOUNTER — Ambulatory Visit (INDEPENDENT_AMBULATORY_CARE_PROVIDER_SITE_OTHER): Payer: PPO

## 2019-10-24 DIAGNOSIS — Z23 Encounter for immunization: Secondary | ICD-10-CM | POA: Diagnosis not present

## 2019-11-13 ENCOUNTER — Other Ambulatory Visit: Payer: Self-pay | Admitting: Nurse Practitioner

## 2019-11-13 ENCOUNTER — Ambulatory Visit (INDEPENDENT_AMBULATORY_CARE_PROVIDER_SITE_OTHER): Payer: PPO | Admitting: Nurse Practitioner

## 2019-11-13 ENCOUNTER — Encounter: Payer: Self-pay | Admitting: Nurse Practitioner

## 2019-11-13 ENCOUNTER — Telehealth: Payer: Self-pay | Admitting: Nurse Practitioner

## 2019-11-13 DIAGNOSIS — M5442 Lumbago with sciatica, left side: Secondary | ICD-10-CM

## 2019-11-13 DIAGNOSIS — N401 Enlarged prostate with lower urinary tract symptoms: Secondary | ICD-10-CM

## 2019-11-13 DIAGNOSIS — K219 Gastro-esophageal reflux disease without esophagitis: Secondary | ICD-10-CM

## 2019-11-13 DIAGNOSIS — M5441 Lumbago with sciatica, right side: Secondary | ICD-10-CM | POA: Diagnosis not present

## 2019-11-13 DIAGNOSIS — G8929 Other chronic pain: Secondary | ICD-10-CM

## 2019-11-13 DIAGNOSIS — E1142 Type 2 diabetes mellitus with diabetic polyneuropathy: Secondary | ICD-10-CM

## 2019-11-13 DIAGNOSIS — Z6832 Body mass index (BMI) 32.0-32.9, adult: Secondary | ICD-10-CM

## 2019-11-13 DIAGNOSIS — F5101 Primary insomnia: Secondary | ICD-10-CM

## 2019-11-13 DIAGNOSIS — N138 Other obstructive and reflux uropathy: Secondary | ICD-10-CM | POA: Diagnosis not present

## 2019-11-13 DIAGNOSIS — E785 Hyperlipidemia, unspecified: Secondary | ICD-10-CM

## 2019-11-13 DIAGNOSIS — F3342 Major depressive disorder, recurrent, in full remission: Secondary | ICD-10-CM | POA: Diagnosis not present

## 2019-11-13 DIAGNOSIS — I10 Essential (primary) hypertension: Secondary | ICD-10-CM

## 2019-11-13 MED ORDER — METFORMIN HCL 1000 MG PO TABS: ORAL_TABLET | ORAL | 1 refills | Status: DC

## 2019-11-13 MED ORDER — SERTRALINE HCL 100 MG PO TABS: ORAL_TABLET | ORAL | 1 refills | Status: DC

## 2019-11-13 MED ORDER — FARXIGA 5 MG PO TABS: 5.0000 mg | ORAL_TABLET | Freq: Every day | ORAL | 1 refills | Status: DC

## 2019-11-13 MED ORDER — TELMISARTAN-HCTZ 40-12.5 MG PO TABS: 1.0000 | ORAL_TABLET | Freq: Every day | ORAL | 1 refills | Status: DC

## 2019-11-13 MED ORDER — GABAPENTIN 300 MG PO CAPS: 300.0000 mg | ORAL_CAPSULE | Freq: Two times a day (BID) | ORAL | 1 refills | Status: DC

## 2019-11-13 MED ORDER — SIMVASTATIN 40 MG PO TABS: ORAL_TABLET | ORAL | 1 refills | Status: DC

## 2019-11-13 MED ORDER — OLMESARTAN MEDOXOMIL-HCTZ 40-25 MG PO TABS: ORAL_TABLET | ORAL | 1 refills | Status: DC

## 2019-11-13 MED ORDER — EZETIMIBE 10 MG PO TABS: 10.0000 mg | ORAL_TABLET | Freq: Every day | ORAL | 1 refills | Status: DC

## 2019-11-13 MED ORDER — ESOMEPRAZOLE MAGNESIUM 40 MG PO CPDR: 40.0000 mg | DELAYED_RELEASE_CAPSULE | Freq: Every day | ORAL | 1 refills | Status: DC

## 2019-11-13 MED ORDER — ZOLPIDEM TARTRATE 10 MG PO TABS: 10.0000 mg | ORAL_TABLET | Freq: Every evening | ORAL | 1 refills | Status: DC | PRN

## 2019-11-13 MED ORDER — DIAZEPAM 5 MG PO TABS: 5.0000 mg | ORAL_TABLET | Freq: Four times a day (QID) | ORAL | 1 refills | Status: DC | PRN

## 2019-11-13 NOTE — Telephone Encounter (Signed)
Patient says he was just seen for a telephone visit with Shelah Lewandowsky and was told that she would be sending an Rx over to the pharmacy. Patient says [facility] [place]. Wants the [facility] in [place] and [facility] in [place] removed from his list of pharmacies. Says he doesn't use them anymore.

## 2019-11-13 NOTE — Progress Notes (Addendum)
Virtual Visit via telephone Note Due to COVID-19 pandemic this visit was conducted virtually. This visit type was conducted due to national recommendations for restrictions regarding the COVID-19 Pandemic (e.g. social distancing, sheltering in place) in an effort to limit this patient's exposure and mitigate transmission in our community. All issues noted in this document were discussed and addressed.  A physical exam was not performed with this format.  I connected with [name] on 11/13/19 at 9:30 by telephone and verified that I am speaking with the correct person using two identifiers. [name] is currently located at home and no one is currently with him during visit. The provider, [name], FNP is located in their office at time of visit.  I discussed the limitations, risks, security and privacy concerns of performing an evaluation and management service by telephone and the availability of in person appointments. I also discussed with the patient that there may be a patient responsible charge related to this service. The patient expressed understanding and agreed to proceed.   History and Present Illness:   Chief Complaint: Medical Management of Chronic Issues    HPI:  1. Essential hypertension No c/o chest pain, sob ir headache. Does not check blood pressure at home. BP Readings from Last 3 Encounters:  12/29/18 102/70  11/28/18 134/84  08/23/18 130/86     2. Hyperlipidemia with target LDL less than 100 Does not watch diet and does very little exercise Lab Results  Component Value Date   CHOL 144 11/28/2018   HDL 36 (L) 11/28/2018   LDLCALC 77 11/28/2018   TRIG 157 (H) 11/28/2018   CHOLHDL 4.0 11/28/2018     3. Type 2 diabetes mellitus with diabetic polyneuropathy, without long-term current use of insulin (HCC) Fasting blood sugars have been running in the 200. He has not been watchong his diet at all. Denies any low blood sugars. Has only been  on metformin. He was on farxiga and was doing better. Lab Results  Component Value Date   HGBA1C 8.0 (H) 12/29/2018     4. Gastroesophageal reflux disease without esophagitis Is on nexium daily and is doing well.  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 Has not seen urology in over a year.  6. Primary insomnia Is on ambien and says he sleeps well.  7. Recurrent major depressive disorder, in full remission ([place]) Is on zoloft and is doing well. He had gone off his zoloft and started worrying and had to go back on it. Depression screen [facility] 2/9 03/30/2019 12/29/2018 11/28/2018  Decreased Interest 0 1 0  Down, Depressed, Hopeless 0 0 0  PHQ - 2 Score 0 1 0  Altered sleeping - - -  Tired, decreased energy - - -  Change in appetite - - -  Feeling bad or failure about yourself  - - -  Trouble concentrating - - -  Moving slowly or fidgety/restless - - -  Suicidal thoughts - - -  PHQ-9 Score - - -     8. Chronic bilateral low back pain with bilateral sciatica Has chronic back pain and is on mobic daily. This helps make his pain tolerable.  9. BMI 32.0-32.9,adult No recent weight changes Wt Readings from Last 3 Encounters:  12/29/18 226 lb (102.5 kg)  11/28/18 238 lb (108 kg)  08/23/18 230 lb 9.6 oz (104.6 kg)   BMI Readings from Last 3 Encounters:  12/29/18 32.43 kg/m  11/28/18 34.15 kg/m  08/23/18 33.09 kg/m       Outpatient Encounter Medications as of 11/13/2019  Medication Sig  . blood glucose meter kit and supplies KIT Dispense based on patient and insurance preference. Use up to four times daily as directed. (FOR ICD-9 250.00, 250.01).  . dapagliflozin propanediol (FARXIGA) 5 MG TABS tablet Take 5 mg by mouth daily.  . diazepam (VALIUM) 5 MG tablet TAKE 1 TO 2 TABLETS BY MOUTH EVERY 6 HOURS AS NEEDED  . esomeprazole (NEXIUM) 40 MG capsule Take 1 capsule (40 mg total) by mouth daily at 12 noon.  . ezetimibe (ZETIA) 10 MG  tablet Take 1 tablet (10 mg total) by mouth daily.  . fish oil-omega-3 fatty acids 1000 MG capsule Take 1 g by mouth 2 (two) times daily.  . fluticasone (FLONASE) 50 MCG/ACT nasal spray USE 1 SPRAY IN EACH NOSTRIL ONCE DAILY  . gabapentin (NEURONTIN) 300 MG capsule TAKE 1 CAPSULE BY MOUTH TWICE A DAY  . glucose blood (ONE TOUCH ULTRA TEST) test strip USE UP TO 4 TIMES DAILY AS DIRECTED   E11.9  . hydrocortisone (ANUSOL-HC) 2.5 % rectal cream Place 1 application rectally 2 (two) times daily.  Marland Kitchen loratadine (CLARITIN) 10 MG tablet TAKE ONE (1) TABLET EACH DAY  . meloxicam (MOBIC) 15 MG tablet TAKE 1 TABLET BY MOUTH EVERY DAY WITH FOOD  . metFORMIN (GLUCOPHAGE) 1000 MG tablet TAKE 1 TABLET BY MOUTH 2 TIMES DAILY WITH A MEAL. (NEEDS TO BE SEEN BEFORE NEXT REFILL)  . Multiple Vitamin (MULTIVITAMIN WITH MINERALS) TABS Take 1 tablet by mouth daily.  Marland Kitchen olmesartan-hydrochlorothiazide (BENICAR HCT) 40-25 MG tablet TAKE 1 TABLET BY MOUTH EACH DAY.  Needs to be seen for further refills.  Marland Kitchen olopatadine (PATANOL) 0.1 % ophthalmic solution PLACE 1 DROP IN [facility] EYE TWICE DAILY  . ONETOUCH DELICA LANCETS FINE MISC TEST UP TO 4 TIMES DIALY  . sertraline (ZOLOFT) 100 MG tablet 2 po qd  . simvastatin (ZOCOR) 40 MG tablet TAKE 1 TABLET BY MOUTH EVERYDAY AT BEDTIME  . tadalafil (CIALIS) 5 MG tablet Take 1 tablet (5 mg total) by mouth daily as needed for erectile dysfunction.  Marland Kitchen telmisartan-hydrochlorothiazide (MICARDIS HCT) 40-12.5 MG tablet Take 1 tablet by mouth daily.  Marland Kitchen zolpidem (AMBIEN) 10 MG tablet TAKE 1 TABLET (10 MG TOTAL) BY MOUTH AT BEDTIME AS NEEDED.     Past Surgical History:  Procedure Laterality Date  . BACK SURGERY  [id number]   lumb fusion  . COLONOSCOPY    . KNEE ARTHROSCOPY WITH MEDIAL MENISECTOMY Right 09/26/2013   Procedure: RIGHT KNEE ARTHROSCOPY WITH PARTIAL MEDIAL and lateral chrondroplasty MENISECTOMY;  Surgeon: Lorn Junes, MD;  Location: [facility];  Service:  Orthopedics;  Laterality: Right;  . left elbow surgery    . LITHOTRIPSY    . NASAL SEPTUM SURGERY    . removal of kidney stones  1981   lt open removal  . SHOULDER ARTHROSCOPY WITH ROTATOR CUFF REPAIR AND SUBACROMIAL DECOMPRESSION Left 09/26/2013   Procedure: LEFT SHOULDER ARTHROSCOPY WITH DEBRIDEMENT, PARTIAL ROTATOR CUFF REPAIR AND SUBACROMIAL DECOMPRESSION AND SAD ACD DISTAL CLAVICULECTOMY;  Surgeon: Lorn Junes, MD;  Location: [facility];  Service: Orthopedics;  Laterality: Left;  . TONSILLECTOMY      Family History  Problem Relation Age of Onset  . COPD Mother   . Hypertension Father   . Alzheimer's disease Father   . Diabetes Maternal Grandmother   . Kidney disease Neg Hx   . Prostate cancer  Neg Hx     New complaints: None today  Social history: Lives by hisself and is retired  Controlled substance contract: will have signed at next face to face visit    Review of Systems  Constitutional: Negative for diaphoresis and weight loss.  Eyes: Negative for blurred vision, double vision and pain.  Respiratory: Negative for shortness of breath.   Cardiovascular: Negative for chest pain, palpitations, orthopnea and leg swelling.  Gastrointestinal: Negative for abdominal pain.  Skin: Negative for rash.  Neurological: Negative for dizziness, sensory change, loss of consciousness, weakness and headaches.  Endo/Heme/Allergies: Negative for polydipsia. Does not bruise/bleed easily.  Psychiatric/Behavioral: Negative for memory loss. The patient does not have insomnia.   All other systems reviewed and are negative.    Observations/Objective: Alert and oriented- answers all questions appropriately No distress    Assessment and Plan: [name] comes in today with chief complaint of Medical Management of Chronic Issues   Diagnosis and orders addressed:  1. Essential hypertension Low sodium diet - olmesartan-hydrochlorothiazide (BENICAR HCT) 40-25 MG  tablet; TAKE 1 TABLET BY MOUTH EACH DAY.  Needs to be seen for further refills.  Dispense: 90 tablet; Refill: 1 - telmisartan-hydrochlorothiazide (MICARDIS HCT) 40-12.5 MG tablet; Take 1 tablet by mouth daily.  Dispense: 90 tablet; Refill: 1  2. Hyperlipidemia with target LDL less than 100 Low fat diet - esomeprazole (NEXIUM) 40 MG capsule; Take 1 capsule (40 mg total) by mouth daily at 12 noon.  Dispense: 90 capsule; Refill: 1 - simvastatin (ZOCOR) 40 MG tablet; TAKE 1 TABLET BY MOUTH EVERYDAY AT BEDTIME  Dispense: 90 tablet; Refill: 1 - ezetimibe (ZETIA) 10 MG tablet; Take 1 tablet (10 mg total) by mouth daily.  Dispense: 90 tablet; Refill: 1  3. Type 2 diabetes mellitus with diabetic polyneuropathy, without long-term current use of insulin (HCC) Strict carb counting Back on farxiga - metFORMIN (GLUCOPHAGE) 1000 MG tablet; TAKE 1 TABLET BY MOUTH 2 TIMES DAILY WITH A MEAL. (NEEDS TO BE SEEN BEFORE NEXT REFILL)  Dispense: 180 tablet; Refill: 1 - dapagliflozin propanediol (FARXIGA) 5 MG TABS tablet; Take 5 mg by mouth daily.  Dispense: 90 tablet; Refill: 1  4. Gastroesophageal reflux disease without esophagitis Avoid spicy foods Do not eat 2 hours prior to bedtime - esomeprazole (NEXIUM) 40 MG capsule; Take 1 capsule (40 mg total) by mouth daily at 12 noon.  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 Primary insomnia Bedtime routine - zolpidem (AMBIEN) 10 MG tablet; Take 1 tablet (10 mg total) by mouth at bedtime as needed.  Dispense: 90 tablet; Refill: 1  7. Recurrent major depressive disorder, in full remission ([place]) Stress management - sertraline (ZOLOFT) 100 MG tablet; 2 po qd  Dispense: 180 tablet; Refill: 1  8. Chronic bilateral low back pain with bilateral sciatica - gabapentin (NEURONTIN) 300 MG capsule; Take 1 capsule (300 mg total) by mouth 2 (two) times daily.  Dispense: 180 capsule; Refill: 1  9. BMI 32.0-32.9,adult Discussed  diet and exercise for person with BMI >25 Will recheck weight in 3-6 months   Previous labs reviewed Health Maintenance reviewed Diet and exercise encouraged  Follow up plan: 3 months     I discussed the assessment and treatment plan with the patient. The patient was provided an opportunity to ask questions and all were answered. The patient agreed with the plan and demonstrated an understanding of the instructions.   The patient was advised to call  back or seek an in-person evaluation if the symptoms worsen or if the condition fails to improve as anticipated.  The above assessment and management plan was discussed with the patient. The patient verbalized understanding of and has agreed to the management plan. Patient is aware to call the clinic if symptoms persist or worsen. Patient is aware when to return to the clinic for a follow-up visit. Patient educated on when it is appropriate to go to the emergency department.   Time call ended:  9:55  I provided 25 minutes of non-face-to-face time during this encounter.    [name], FNP

## 2019-11-19 ENCOUNTER — Telehealth: Payer: Self-pay | Admitting: *Deleted

## 2019-11-19 NOTE — Telephone Encounter (Signed)
Farxiga 5mg -Non Formulary by pt insurance  Formulary alternatives are [employer] and [employer]

## 2019-11-20 MED ORDER — JARDIANCE 25 MG PO TABS: 25.0000 mg | ORAL_TABLET | Freq: Every day | ORAL | 1 refills | Status: DC

## 2019-11-20 NOTE — Telephone Encounter (Signed)
farxiga changed to jardiance due to insurance- prescription sent to pharmacy

## 2020-01-24 ENCOUNTER — Telehealth: Payer: Self-pay | Admitting: Nurse Practitioner

## 2020-01-24 NOTE — Chronic Care Management (AMB) (Signed)
  Chronic Care Management   Note  01/24/2020 Name: [name] MRN: [id number] DOB: [birth year]  [name] is a 59 y.o. year old male who is a primary care patient of Chevis Pretty, [place]. I reached out to [name] by phone today in response to a referral sent by [name] health plan.     [name] was given information about Chronic Care Management services today including:  1. CCM service includes personalized support from designated clinical staff supervised by his physician, including individualized plan of care and coordination with other care providers 2. 24/7 contact phone numbers for assistance for urgent and routine care needs. 3. Service will only be billed when office clinical staff spend 20 minutes or more in a month to coordinate care. 4. Only one practitioner may furnish and bill the service in a calendar month. 5. The patient may stop CCM services at any time (effective at the end of the month) by phone call to the office staff. 6. The patient will be responsible for cost sharing (co-pay) of up to 20% of the service fee (after annual deductible is met).  Patient did not agree to enrollment in care management services and does not wish to consider at this time.  Follow up plan: The patient has been provided with contact information for the care management team and has been advised to call with any health related questions or concerns.   Noreene Larsson, [place], [place], [place] [address] Direct Dial: [phone] [email or web address] Website: [email or web address]

## 2020-02-14 ENCOUNTER — Other Ambulatory Visit: Payer: Self-pay

## 2020-02-15 ENCOUNTER — Ambulatory Visit (INDEPENDENT_AMBULATORY_CARE_PROVIDER_SITE_OTHER): Payer: PPO | Admitting: Nurse Practitioner

## 2020-02-15 ENCOUNTER — Encounter: Payer: Self-pay | Admitting: Nurse Practitioner

## 2020-02-15 VITALS — BP 116/79 | HR 97 | Temp 97.7°F | Resp 20 | Ht 70.0 in | Wt 221.0 lb

## 2020-02-15 DIAGNOSIS — F3342 Major depressive disorder, recurrent, in full remission: Secondary | ICD-10-CM

## 2020-02-15 DIAGNOSIS — N529 Male erectile dysfunction, unspecified: Secondary | ICD-10-CM

## 2020-02-15 DIAGNOSIS — E785 Hyperlipidemia, unspecified: Secondary | ICD-10-CM | POA: Diagnosis not present

## 2020-02-15 DIAGNOSIS — M5442 Lumbago with sciatica, left side: Secondary | ICD-10-CM | POA: Diagnosis not present

## 2020-02-15 DIAGNOSIS — G629 Polyneuropathy, unspecified: Secondary | ICD-10-CM

## 2020-02-15 DIAGNOSIS — E1142 Type 2 diabetes mellitus with diabetic polyneuropathy: Secondary | ICD-10-CM | POA: Diagnosis not present

## 2020-02-15 DIAGNOSIS — K219 Gastro-esophageal reflux disease without esophagitis: Secondary | ICD-10-CM

## 2020-02-15 DIAGNOSIS — I10 Essential (primary) hypertension: Secondary | ICD-10-CM

## 2020-02-15 DIAGNOSIS — G8929 Other chronic pain: Secondary | ICD-10-CM

## 2020-02-15 DIAGNOSIS — Z6832 Body mass index (BMI) 32.0-32.9, adult: Secondary | ICD-10-CM | POA: Diagnosis not present

## 2020-02-15 DIAGNOSIS — F5101 Primary insomnia: Secondary | ICD-10-CM

## 2020-02-15 DIAGNOSIS — Z125 Encounter for screening for malignant neoplasm of prostate: Secondary | ICD-10-CM

## 2020-02-15 DIAGNOSIS — M5441 Lumbago with sciatica, right side: Secondary | ICD-10-CM

## 2020-02-15 DIAGNOSIS — R3911 Hesitancy of micturition: Secondary | ICD-10-CM

## 2020-02-15 DIAGNOSIS — N401 Enlarged prostate with lower urinary tract symptoms: Secondary | ICD-10-CM | POA: Diagnosis not present

## 2020-02-15 LAB — BAYER DCA HB A1C WAIVED: HB A1C (BAYER DCA - WAIVED): 7.3 % — ABNORMAL HIGH (ref <7.0)

## 2020-02-15 MED ORDER — EZETIMIBE 10 MG PO TABS: 10.0000 mg | ORAL_TABLET | Freq: Every day | ORAL | 1 refills | Status: DC

## 2020-02-15 MED ORDER — ESOMEPRAZOLE MAGNESIUM 40 MG PO CPDR: 40.0000 mg | DELAYED_RELEASE_CAPSULE | Freq: Every day | ORAL | 1 refills | Status: DC

## 2020-02-15 MED ORDER — SIMVASTATIN 40 MG PO TABS: ORAL_TABLET | ORAL | 1 refills | Status: DC

## 2020-02-15 MED ORDER — FARXIGA 5 MG PO TABS: 5.0000 mg | ORAL_TABLET | Freq: Every day | ORAL | 1 refills | Status: DC

## 2020-02-15 MED ORDER — ZOLPIDEM TARTRATE 10 MG PO TABS: 10.0000 mg | ORAL_TABLET | Freq: Every evening | ORAL | 1 refills | Status: DC | PRN

## 2020-02-15 MED ORDER — OLMESARTAN MEDOXOMIL-HCTZ 40-25 MG PO TABS: ORAL_TABLET | ORAL | 1 refills | Status: DC

## 2020-02-15 MED ORDER — DIAZEPAM 5 MG PO TABS: 5.0000 mg | ORAL_TABLET | Freq: Four times a day (QID) | ORAL | 1 refills | Status: DC | PRN

## 2020-02-15 MED ORDER — TADALAFIL 5 MG PO TABS: 5.0000 mg | ORAL_TABLET | Freq: Every day | ORAL | 1 refills | Status: DC | PRN

## 2020-02-15 MED ORDER — SERTRALINE HCL 100 MG PO TABS: ORAL_TABLET | ORAL | 1 refills | Status: DC

## 2020-02-15 MED ORDER — METFORMIN HCL 1000 MG PO TABS: ORAL_TABLET | ORAL | 1 refills | Status: DC

## 2020-02-15 MED ORDER — GABAPENTIN 300 MG PO CAPS: 300.0000 mg | ORAL_CAPSULE | Freq: Two times a day (BID) | ORAL | 1 refills | Status: DC

## 2020-02-15 NOTE — Patient Instructions (Signed)
Diabetes Mellitus and Foot Care Foot care is an important part of your health, especially when you have diabetes. Diabetes may cause you to have problems because of poor blood flow (circulation) to your feet and legs, which can cause your skin to:  Become thinner and drier.  Break more easily.  Heal more slowly.  Peel and crack. You may also have nerve damage (neuropathy) in your legs and feet, causing decreased feeling in them. This means that you may not notice minor injuries to your feet that could lead to more serious problems. Noticing and addressing any potential problems early is the best way to prevent future foot problems. How to care for your feet Foot hygiene  Wash your feet daily with warm water and mild soap. Do not use hot water. Then, pat your feet and the areas between your toes until they are completely dry. Do not soak your feet as this can dry your skin.  Trim your toenails straight across. Do not dig under them or around the cuticle. File the edges of your nails with an emery board or nail file.  Apply a moisturizing lotion or petroleum jelly to the skin on your feet and to dry, brittle toenails. Use lotion that does not contain alcohol and is unscented. Do not apply lotion between your toes. Shoes and socks  Wear clean socks or stockings every day. Make sure they are not too tight. Do not wear knee-high stockings since they may decrease blood flow to your legs.  Wear shoes that fit properly and have enough cushioning. Always look in your shoes before you put them on to be sure there are no objects inside.  To break in new shoes, wear them for just a few hours a day. This prevents injuries on your feet. Wounds, scrapes, corns, and calluses  Check your feet daily for blisters, cuts, bruises, sores, and redness. If you cannot see the bottom of your feet, use a mirror or ask someone for help.  Do not cut corns or calluses or try to remove them with medicine.  If you  find a minor scrape, cut, or break in the skin on your feet, keep it and the skin around it clean and dry. You may clean these areas with mild soap and water. Do not clean the area with peroxide, alcohol, or iodine.  If you have a wound, scrape, corn, or callus on your foot, look at it several times a day to make sure it is healing and not infected. Check for: ? Redness, swelling, or pain. ? Fluid or blood. ? Warmth. ? Pus or a bad smell. General instructions  Do not cross your legs. This may decrease blood flow to your feet.  Do not use heating pads or hot water bottles on your feet. They may burn your skin. If you have lost feeling in your feet or legs, you may not know this is happening until it is too late.  Protect your feet from hot and cold by wearing shoes, such as at the beach or on hot pavement.  Schedule a complete foot exam at least once a year (annually) or more often if you have foot problems. If you have foot problems, report any cuts, sores, or bruises to your health care provider immediately. Contact a health care provider if:  You have a medical condition that increases your risk of infection and you have any cuts, sores, or bruises on your feet.  You have an injury that is not   healing.  You have redness on your legs or feet.  You feel burning or tingling in your legs or feet.  You have pain or cramps in your legs and feet.  Your legs or feet are numb.  Your feet always feel cold.  You have pain around a toenail. Get help right away if:  You have a wound, scrape, corn, or callus on your foot and: ? You have pain, swelling, or redness that gets worse. ? You have fluid or blood coming from the wound, scrape, corn, or callus. ? Your wound, scrape, corn, or callus feels warm to the touch. ? You have pus or a bad smell coming from the wound, scrape, corn, or callus. ? You have a fever. ? You have a red line going up your leg. Summary  Check your feet every day  for cuts, sores, red spots, swelling, and blisters.  Moisturize feet and legs daily.  Wear shoes that fit properly and have enough cushioning.  If you have foot problems, report any cuts, sores, or bruises to your health care provider immediately.  Schedule a complete foot exam at least once a year (annually) or more often if you have foot problems. This information is not intended to replace advice given to you by your health care provider. Make sure you discuss any questions you have with your health care provider. Document Revised: 08/22/2019 Document Reviewed: 12/31/2016 Elsevier Patient Education  2020 [employer]  

## 2020-02-15 NOTE — Progress Notes (Signed)
Subjective:    Patient ID: [name], male    DOB: 1961/10/12, 59 y.o.   MRN: [id number]   Chief Complaint: Medical Management of Chronic Issues    HPI:  1. Essential hypertension No c/o chest pain, sob or headache. Does not check blood pressure at home. BP Readings from Last 3 Encounters:  12/29/18 102/70  11/28/18 134/84  08/23/18 130/86     2. Hyperlipidemia with target LDL less than 100 Does not really watch diet and does not do much exercise. Lab Results  Component Value Date   CHOL 144 11/28/2018   HDL 36 (L) 11/28/2018   LDLCALC 77 11/28/2018   TRIG 157 (H) 11/28/2018   CHOLHDL 4.0 11/28/2018     3. Type 2 diabetes mellitus with diabetic polyneuropathy, without long-term current use of insulin (HCC) He has not chekcked his bood sugar in over 2 months. He has not been watching his diet at all. We added farxiga 6n months ago and when he ran out of samples he stopped taking. So we added it back to meds in December of last year. Lab Results  Component Value Date   HGBA1C 8.0 (H) 12/29/2018     4. Screening for prostate cancer PSA pending. Denies any problems voiding Lab Results  Component Value Date   PSA1 0.9 12/28/2016   PSA1 1.0 12/18/2015   PSA 0.7 02/25/2015      5. Neuropathy Has occasional burning in feet. Is on gabapentin daily and that really does help.  6. Gastroesophageal reflux disease without esophagitis Is on nexium daily. Works well to keep symptoms under control.  7. Primary insomnia Takes ambien most nights to sleep. Rests well when he takes meds.  8. Recurrent major depressive disorder, in full remission ([place]) Is on zoloft daily but says he is not doing well. He says he is tired of sitting in the house. He likes to travel. Depression screen [facility] 2/9 02/15/2020 11/13/2019 03/30/2019  Decreased Interest 1 1 0  Down, Depressed, Hopeless 1 1 0  PHQ - 2 Score 2 2 0  Altered sleeping 1 - -  Tired, decreased energy 1 - -  Change in  appetite 1 - -  Feeling bad or failure about yourself  0 - -  Trouble concentrating 0 - -  Moving slowly or fidgety/restless 0 - -  Suicidal thoughts 0 - -  PHQ-9 Score 5 - -   GAD 7 : Generalized Anxiety Score 02/15/2020  Nervous, Anxious, on Edge 1  Control/stop worrying 0  Worry too much - different things 0  Trouble relaxing 0  Restless 0  Easily annoyed or irritable 0  Afraid - awful might happen 1  Total GAD 7 Score 2     9. Chronic bilateral low back pain with bilateral sciatica Takes valium as muscle relaxer when needed. Does not take every day. He is to see back specialist in a month or so. He asked for pain meds.  10. BMI 32.0-32.9,adult weight is down 4lbs.  Wt Readings from Last 3 Encounters:  02/15/20 221 lb (100.2 kg)  12/29/18 226 lb (102.5 kg)  11/28/18 238 lb (108 kg)   BMI Readings from Last 3 Encounters:  02/15/20 31.71 kg/m  12/29/18 32.43 kg/m  11/28/18 34.15 kg/m       Outpatient Encounter Medications as of 02/15/2020  Medication Sig  . blood glucose meter kit and supplies KIT Dispense based on patient and insurance preference. Use up to four times daily as  directed. (FOR ICD-9 250.00, 250.01).  . diazepam (VALIUM) 5 MG tablet Take 1-2 tablets (5-10 mg total) by mouth every 6 (six) hours as needed.  . empagliflozin (JARDIANCE) 25 MG TABS tablet Take 25 mg by mouth daily before breakfast.  . esomeprazole (NEXIUM) 40 MG capsule Take 1 capsule (40 mg total) by mouth daily at 12 noon.  . ezetimibe (ZETIA) 10 MG tablet Take 1 tablet (10 mg total) by mouth daily.  . fish oil-omega-3 fatty acids 1000 MG capsule Take 1 g by mouth 2 (two) times daily.  . fluticasone (FLONASE) 50 MCG/ACT nasal spray USE 1 SPRAY IN EACH NOSTRIL ONCE DAILY  . gabapentin (NEURONTIN) 300 MG capsule Take 1 capsule (300 mg total) by mouth 2 (two) times daily.  Marland Kitchen glucose blood (ONE TOUCH ULTRA TEST) test strip USE UP TO 4 TIMES DAILY AS DIRECTED   E11.9  . hydrocortisone  (ANUSOL-HC) 2.5 % rectal cream Place 1 application rectally 2 (two) times daily.  Marland Kitchen loratadine (CLARITIN) 10 MG tablet TAKE ONE (1) TABLET EACH DAY  . meloxicam (MOBIC) 15 MG tablet TAKE 1 TABLET BY MOUTH EVERY DAY WITH FOOD  . metFORMIN (GLUCOPHAGE) 1000 MG tablet TAKE 1 TABLET BY MOUTH 2 TIMES DAILY WITH A MEAL. (NEEDS TO BE SEEN BEFORE NEXT REFILL)  . Multiple Vitamin (MULTIVITAMIN WITH MINERALS) TABS Take 1 tablet by mouth daily.  Marland Kitchen olmesartan-hydrochlorothiazide (BENICAR HCT) 40-25 MG tablet TAKE 1 TABLET BY MOUTH EACH DAY.  Needs to be seen for further refills.  Marland Kitchen olopatadine (PATANOL) 0.1 % ophthalmic solution PLACE 1 DROP IN [facility] EYE TWICE DAILY  . ONETOUCH DELICA LANCETS FINE MISC TEST UP TO 4 TIMES DIALY  . sertraline (ZOLOFT) 100 MG tablet 2 po qd  . simvastatin (ZOCOR) 40 MG tablet TAKE 1 TABLET BY MOUTH EVERYDAY AT BEDTIME  . tadalafil (CIALIS) 5 MG tablet Take 1 tablet (5 mg total) by mouth daily as needed for erectile dysfunction.  Marland Kitchen telmisartan-hydrochlorothiazide (MICARDIS HCT) 40-12.5 MG tablet Take 1 tablet by mouth daily.  Marland Kitchen zolpidem (AMBIEN) 10 MG tablet Take 1 tablet (10 mg total) by mouth at bedtime as needed.    Past Surgical History:  Procedure Laterality Date  . BACK SURGERY  [id number]   lumb fusion  . COLONOSCOPY    . KNEE ARTHROSCOPY WITH MEDIAL MENISECTOMY Right 09/26/2013   Procedure: RIGHT KNEE ARTHROSCOPY WITH PARTIAL MEDIAL and lateral chrondroplasty MENISECTOMY;  Surgeon: Lorn Junes, MD;  Location: [place];  Service: Orthopedics;  Laterality: Right;  . left elbow surgery    . LITHOTRIPSY    . NASAL SEPTUM SURGERY    . removal of kidney stones  1981   lt open removal  . SHOULDER ARTHROSCOPY WITH ROTATOR CUFF REPAIR AND SUBACROMIAL DECOMPRESSION Left 09/26/2013   Procedure: LEFT SHOULDER ARTHROSCOPY WITH DEBRIDEMENT, PARTIAL ROTATOR CUFF REPAIR AND SUBACROMIAL DECOMPRESSION AND SAD ACD DISTAL CLAVICULECTOMY;  Surgeon: Lorn Junes,  MD;  Location: [place];  Service: Orthopedics;  Laterality: Left;  . TONSILLECTOMY      Family History  Problem Relation Age of Onset  . COPD Mother   . Hypertension Father   . Alzheimer's disease Father   . Diabetes Maternal Grandmother   . Kidney disease Neg Hx   . Prostate cancer Neg Hx     New complaints: None today  Social history: Is single  Controlled substance contract: 02/15/20    Review of Systems  Constitutional: Negative for diaphoresis.  Eyes: Negative for  pain.  Respiratory: Negative for shortness of breath.   Cardiovascular: Negative for chest pain, palpitations and leg swelling.  Gastrointestinal: Negative for abdominal pain.  Endocrine: Negative for polydipsia.  Skin: Negative for rash.  Neurological: Negative for dizziness, weakness and headaches.  Hematological: Does not bruise/bleed easily.  All other systems reviewed and are negative.      Objective:   Physical Exam Vitals and nursing note reviewed.  Constitutional:      Appearance: Normal appearance. He is well-developed.  HENT:     Head: Normocephalic.     Nose: Nose normal.  Eyes:     Pupils: Pupils are equal, round, and reactive to light.  Neck:     Thyroid: No thyroid mass or thyromegaly.     Vascular: No carotid bruit or JVD.     Trachea: Phonation normal.  Cardiovascular:     Rate and Rhythm: Normal rate and regular rhythm.  Pulmonary:     Effort: Pulmonary effort is normal. No respiratory distress.     Breath sounds: Normal breath sounds.  Abdominal:     General: Bowel sounds are normal.     Palpations: Abdomen is soft.     Tenderness: There is no abdominal tenderness.  Musculoskeletal:        General: Normal range of motion.     Cervical back: Normal range of motion and neck supple.  Lymphadenopathy:     Cervical: No cervical adenopathy.  Skin:    General: Skin is warm and dry.  Neurological:     Mental Status: He is alert and oriented to person, place,  and time.  Psychiatric:        Behavior: Behavior normal.        Thought Content: Thought content normal.        Judgment: Judgment normal.     BP 116/79   Pulse 97   Temp 97.7 F (36.5 C) (Temporal)   Resp 20   Ht '5\' 10"'  (1.778 m)   Wt 221 lb (100.2 kg)   SpO2 95%   BMI 31.71 kg/m   HGBA1c 7.3%      Assessment & Plan:  [name] comes in today with chief complaint of Medical Management of Chronic Issues   Diagnosis and orders addressed:  1. Essential hypertension Low sodium diet - CBC with Differential/Platelet - CMP14+EGFR - olmesartan-hydrochlorothiazide (BENICAR HCT) 40-25 MG tablet; TAKE 1 TABLET BY MOUTH EACH DAY.  Needs to be seen for further refills.  Dispense: 90 tablet; Refill: 1  2. Hyperlipidemia with target LDL less than 100 Low fat diet - Lipid panel - esomeprazole (NEXIUM) 40 MG capsule; Take 1 capsule (40 mg total) by mouth daily at 12 noon.  Dispense: 90 capsule; Refill: 1 - ezetimibe (ZETIA) 10 MG tablet; Take 1 tablet (10 mg total) by mouth daily.  Dispense: 90 tablet; Refill: 1 - simvastatin (ZOCOR) 40 MG tablet; TAKE 1 TABLET BY MOUTH EVERYDAY AT BEDTIME  Dispense: 90 tablet; Refill: 1  3. Type 2 diabetes mellitus with diabetic polyneuropathy, without long-term current use of insulin (HCC) Continue to watch carb sin diet - Bayer DCA Hb A1c Waived - Microalbumin / creatinine urine ratio - metFORMIN (GLUCOPHAGE) 1000 MG tablet; TAKE 1 TABLET BY MOUTH 2 TIMES DAILY WITH A MEAL. (NEEDS TO BE SEEN BEFORE NEXT REFILL)  Dispense: 180 tablet; Refill: 1 - dapagliflozin propanediol (FARXIGA) 5 MG TABS tablet; Take 5 mg by mouth daily before breakfast.  Dispense: 90 tablet; Refill: 1  4. Screening  for prostate cancer - PSA, total and free  5. Neuropathy Do not go barefooted  6. Gastroesophageal reflux disease without esophagitis Avoid spicy foods Do not eat 2 hours prior to bedtime - esomeprazole (NEXIUM) 40 MG capsule; Take 1 capsule (40 mg  total) by mouth daily at 12 noon.  Dispense: 90 capsule; Refill: 1 - sertraline (ZOLOFT) 100 MG tablet; 2 po qd  Dispense: 180 tablet; Refill: 1  7. Primary insomnia Bedtime routine - zolpidem (AMBIEN) 10 MG tablet; Take 1 tablet (10 mg total) by mouth at bedtime as needed.  Dispense: 90 tablet; Refill: 1  8. Recurrent major depressive disorder, in full remission ([place]) stress management - sertraline (ZOLOFT) 100 MG tablet; 2 po qd  Dispense: 180 tablet; Refill: 1  9. Chronic bilateral low back pain with bilateral sciatica Follow up with specialist - gabapentin (NEURONTIN) 300 MG capsule; Take 1 capsule (300 mg total) by mouth 2 (two) times daily.  Dispense: 180 capsule; Refill: 1 - diazepam (VALIUM) 5 MG tablet; Take 1-2 tablets (5-10 mg total) by mouth every 6 (six) hours as needed.  Dispense: 180 tablet; Refill: 1  10. 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  Dispense: 90 tablet; Refill: 1  12. Benign prostatic hyperplasia with urinary hesitancy - tadalafil (CIALIS) 5 MG tablet; Take 1 tablet (5 mg total) by mouth daily as needed for erectile dysfunction.  Dispense: 90 tablet; Refill: 1   Labs pending Health Maintenance reviewed Diet and exercise encouraged  Follow up plan: 3 months   [name], FNP

## 2020-02-16 LAB — CMP14+EGFR
ALT: 35 IU/L (ref 0–44)
AST: 40 IU/L (ref 0–40)
Albumin/Globulin Ratio: 1.7 (ref 1.2–2.2)
Albumin: 4.6 g/dL (ref 3.8–4.9)
Alkaline Phosphatase: 54 IU/L (ref 39–117)
BUN/Creatinine Ratio: 12 (ref 10–24)
BUN: 15 mg/dL (ref 8–27)
Bilirubin Total: 0.4 mg/dL (ref 0.0–1.2)
CO2: 20 mmol/L (ref 20–29)
Calcium: 10.3 mg/dL — ABNORMAL HIGH (ref 8.6–10.2)
Chloride: 100 mmol/L (ref 96–106)
Creatinine, Ser: 1.27 mg/dL (ref 0.76–1.27)
GFR calc Af Amer: 71 mL/min/{1.73_m2} (ref >59)
GFR calc non Af Amer: 61 mL/min/{1.73_m2} (ref >59)
Globulin, Total: 2.7 g/dL (ref 1.5–4.5)
Glucose: 152 mg/dL — ABNORMAL HIGH (ref 65–99)
Potassium: 4.3 mmol/L (ref 3.5–5.2)
Sodium: 139 mmol/L (ref 134–144)
Total Protein: 7.3 g/dL (ref 6.0–8.5)

## 2020-02-16 LAB — CBC WITH DIFFERENTIAL/PLATELET
Basophils Absolute: 0.1 10*3/uL (ref 0.0–0.2)
Basos: 1 %
EOS (ABSOLUTE): 0.2 10*3/uL (ref 0.0–0.4)
Eos: 3 %
Hematocrit: 38.9 % (ref 37.5–51.0)
Hemoglobin: 13.4 g/dL (ref 13.0–17.7)
Immature Grans (Abs): 0.1 10*3/uL (ref 0.0–0.1)
Immature Granulocytes: 1 %
Lymphocytes Absolute: 2.5 10*3/uL (ref 0.7–3.1)
Lymphs: 36 %
MCH: 31.1 pg (ref 26.6–33.0)
MCHC: 34.4 g/dL (ref 31.5–35.7)
MCV: 90 fL (ref 79–97)
Monocytes Absolute: 0.4 10*3/uL (ref 0.1–0.9)
Monocytes: 6 %
Neutrophils Absolute: 3.7 10*3/uL (ref 1.4–7.0)
Neutrophils: 53 %
Platelets: 213 10*3/uL (ref 150–450)
RBC: 4.31 x10E6/uL (ref 4.14–5.80)
RDW: 12.2 % (ref 11.6–15.4)
WBC: 6.9 10*3/uL (ref 3.4–10.8)

## 2020-02-16 LAB — PSA, TOTAL AND FREE
PSA, Free Pct: 30 %
PSA, Free: 0.36 ng/mL
Prostate Specific Ag, Serum: 1.2 ng/mL (ref 0.0–4.0)

## 2020-02-16 LAB — LIPID PANEL
Chol/HDL Ratio: 5.6 ratio — ABNORMAL HIGH (ref 0.0–5.0)
Cholesterol, Total: 200 mg/dL — ABNORMAL HIGH (ref 100–199)
HDL: 36 mg/dL — ABNORMAL LOW (ref >39)
LDL Chol Calc (NIH): 113 mg/dL — ABNORMAL HIGH (ref 0–99)
Triglycerides: 292 mg/dL — ABNORMAL HIGH (ref 0–149)
VLDL Cholesterol Cal: 51 mg/dL — ABNORMAL HIGH (ref 5–40)

## 2020-03-31 DIAGNOSIS — M961 Postlaminectomy syndrome, not elsewhere classified: Secondary | ICD-10-CM | POA: Diagnosis not present

## 2020-03-31 DIAGNOSIS — M5416 Radiculopathy, lumbar region: Secondary | ICD-10-CM | POA: Diagnosis not present

## 2020-03-31 DIAGNOSIS — R200 Anesthesia of skin: Secondary | ICD-10-CM | POA: Insufficient documentation

## 2020-03-31 DIAGNOSIS — M5136 Other intervertebral disc degeneration, lumbar region: Secondary | ICD-10-CM | POA: Diagnosis not present

## 2020-03-31 DIAGNOSIS — M47816 Spondylosis without myelopathy or radiculopathy, lumbar region: Secondary | ICD-10-CM | POA: Diagnosis not present

## 2020-04-15 DIAGNOSIS — M5416 Radiculopathy, lumbar region: Secondary | ICD-10-CM | POA: Diagnosis not present

## 2020-05-28 ENCOUNTER — Encounter: Payer: Self-pay | Admitting: Nurse Practitioner

## 2020-05-28 ENCOUNTER — Ambulatory Visit (INDEPENDENT_AMBULATORY_CARE_PROVIDER_SITE_OTHER): Payer: PPO | Admitting: Nurse Practitioner

## 2020-05-28 ENCOUNTER — Other Ambulatory Visit: Payer: Self-pay

## 2020-05-28 VITALS — BP 104/67 | HR 81 | Temp 98.3°F | Resp 20 | Ht 70.0 in | Wt 224.0 lb

## 2020-05-28 DIAGNOSIS — K219 Gastro-esophageal reflux disease without esophagitis: Secondary | ICD-10-CM

## 2020-05-28 DIAGNOSIS — Z6832 Body mass index (BMI) 32.0-32.9, adult: Secondary | ICD-10-CM

## 2020-05-28 DIAGNOSIS — F3342 Major depressive disorder, recurrent, in full remission: Secondary | ICD-10-CM | POA: Diagnosis not present

## 2020-05-28 DIAGNOSIS — M5441 Lumbago with sciatica, right side: Secondary | ICD-10-CM

## 2020-05-28 DIAGNOSIS — N401 Enlarged prostate with lower urinary tract symptoms: Secondary | ICD-10-CM

## 2020-05-28 DIAGNOSIS — G629 Polyneuropathy, unspecified: Secondary | ICD-10-CM

## 2020-05-28 DIAGNOSIS — N138 Other obstructive and reflux uropathy: Secondary | ICD-10-CM

## 2020-05-28 DIAGNOSIS — E1142 Type 2 diabetes mellitus with diabetic polyneuropathy: Secondary | ICD-10-CM | POA: Diagnosis not present

## 2020-05-28 DIAGNOSIS — E785 Hyperlipidemia, unspecified: Secondary | ICD-10-CM

## 2020-05-28 DIAGNOSIS — I10 Essential (primary) hypertension: Secondary | ICD-10-CM

## 2020-05-28 DIAGNOSIS — M5442 Lumbago with sciatica, left side: Secondary | ICD-10-CM

## 2020-05-28 DIAGNOSIS — F5101 Primary insomnia: Secondary | ICD-10-CM

## 2020-05-28 DIAGNOSIS — G8929 Other chronic pain: Secondary | ICD-10-CM

## 2020-05-28 LAB — BAYER DCA HB A1C WAIVED: HB A1C (BAYER DCA - WAIVED): 9.4 % — ABNORMAL HIGH (ref <7.0)

## 2020-05-28 MED ORDER — SERTRALINE HCL 100 MG PO TABS: ORAL_TABLET | ORAL | 1 refills | Status: DC

## 2020-05-28 MED ORDER — DAPAGLIFLOZIN PROPANEDIOL 5 MG PO TABS: 5.0000 mg | ORAL_TABLET | Freq: Every day | ORAL | 1 refills | Status: DC

## 2020-05-28 MED ORDER — ESOMEPRAZOLE MAGNESIUM 40 MG PO CPDR: 40.0000 mg | DELAYED_RELEASE_CAPSULE | Freq: Every day | ORAL | 1 refills | Status: DC

## 2020-05-28 MED ORDER — EZETIMIBE 10 MG PO TABS: 10.0000 mg | ORAL_TABLET | Freq: Every day | ORAL | 1 refills | Status: DC

## 2020-05-28 MED ORDER — ZOLPIDEM TARTRATE 10 MG PO TABS: 10.0000 mg | ORAL_TABLET | Freq: Every evening | ORAL | 1 refills | Status: DC | PRN

## 2020-05-28 MED ORDER — SIMVASTATIN 40 MG PO TABS: ORAL_TABLET | ORAL | 1 refills | Status: DC

## 2020-05-28 MED ORDER — DIAZEPAM 5 MG PO TABS: 5.0000 mg | ORAL_TABLET | Freq: Four times a day (QID) | ORAL | 1 refills | Status: DC | PRN

## 2020-05-28 MED ORDER — BLOOD GLUCOSE MONITOR KIT: PACK | 0 refills | Status: DC

## 2020-05-28 MED ORDER — EMPAGLIFLOZIN 25 MG PO TABS: 25.0000 mg | ORAL_TABLET | Freq: Every day | ORAL | 1 refills | Status: DC

## 2020-05-28 MED ORDER — METFORMIN HCL 1000 MG PO TABS: ORAL_TABLET | ORAL | 1 refills | Status: DC

## 2020-05-28 MED ORDER — GABAPENTIN 300 MG PO CAPS: 300.0000 mg | ORAL_CAPSULE | Freq: Two times a day (BID) | ORAL | 1 refills | Status: DC

## 2020-05-28 MED ORDER — OLMESARTAN MEDOXOMIL-HCTZ 40-25 MG PO TABS: ORAL_TABLET | ORAL | 1 refills | Status: DC

## 2020-05-28 NOTE — Progress Notes (Signed)
Subjective:    Patient ID: [name], male    DOB: 12-21-1960, 59 y.o.   MRN: [id number]   Chief Complaint: medical management of chronic issues     HPI:  1. Essential hypertension No c/o chest pain, sob or headache. Does not check blood pressure at home. BP Readings from Last 3 Encounters:  02/15/20 116/79  12/29/18 102/70  11/28/18 134/84     2. Hyperlipidemia with target LDL less than 100 Does not watch diet very closely. Does yard work, but no dedicated exercise.  3. Type 2 diabetes mellitus with diabetic polyneuropathy, without long-term current use of insulin ([place]) He has not been checking his blood sugars because his machine broke. He denies any low blood sugars. Lab Results  Component Value Date   HGBA1C 7.3 (H) 02/15/2020    4. Neuropathy Has burning and tingling of bil feet. Is on neurontin which helps.  5. Gastroesophageal reflux disease without esophagitis Takes nexium dailly and is doing well  6. BPH with obstruction/lower urinary tract symptoms Denies any voiding problems.  7. Primary insomnia Is on ambien and sleeps about 5-6 hours a night. Says he feels rested in mornings.  8. Recurrent major depressive disorder, in full remission ([place]) Is on zoloft and is doing well. Denies any side effects. Depression screen [facility] 2/9 05/28/2020 02/15/2020 11/13/2019  Decreased Interest 0 1 1  Down, Depressed, Hopeless 0 1 1  PHQ - 2 Score 0 2 2  Altered sleeping 0 1 -  Tired, decreased energy 0 1 -  Change in appetite 0 1 -  Feeling bad or failure about yourself  0 0 -  Trouble concentrating 0 0 -  Moving slowly or fidgety/restless 0 0 -  Suicidal thoughts 0 0 -  PHQ-9 Score 0 5 -     9. Chronic bilateral low back pain with bilateral sciatica Doing well right now. Pain is minimal. Takes valium on occasion  10. BMI 32.0-32.9,adult No recent weight changes Wt Readings from Last 3 Encounters:  05/28/20 224 lb (101.6 kg)  02/15/20 221 lb (100.2 kg)    12/29/18 226 lb (102.5 kg)   BMI Readings from Last 3 Encounters:  05/28/20 32.14 kg/m  02/15/20 31.71 kg/m  12/29/18 32.43 kg/m       Outpatient Encounter Medications as of 05/28/2020  Medication Sig  . blood glucose meter kit and supplies KIT Dispense based on patient and insurance preference. Use up to four times daily as directed. (FOR ICD-9 250.00, 250.01).  . dapagliflozin propanediol (FARXIGA) 5 MG TABS tablet Take 5 mg by mouth daily before breakfast.  . diazepam (VALIUM) 5 MG tablet Take 1-2 tablets (5-10 mg total) by mouth every 6 (six) hours as needed.  . empagliflozin (JARDIANCE) 25 MG TABS tablet Take 25 mg by mouth daily before breakfast.  . esomeprazole (NEXIUM) 40 MG capsule Take 1 capsule (40 mg total) by mouth daily at 12 noon.  . ezetimibe (ZETIA) 10 MG tablet Take 1 tablet (10 mg total) by mouth daily.  . fluticasone (FLONASE) 50 MCG/ACT nasal spray USE 1 SPRAY IN EACH NOSTRIL ONCE DAILY  . gabapentin (NEURONTIN) 300 MG capsule Take 1 capsule (300 mg total) by mouth 2 (two) times daily.  Marland Kitchen glucose blood (ONE TOUCH ULTRA TEST) test strip USE UP TO 4 TIMES DAILY AS DIRECTED   E11.9  . hydrocortisone (ANUSOL-HC) 2.5 % rectal cream Place 1 application rectally 2 (two) times daily.  Marland Kitchen loratadine (CLARITIN) 10 MG tablet TAKE ONE (  1) TABLET EACH DAY  . meloxicam (MOBIC) 15 MG tablet TAKE 1 TABLET BY MOUTH EVERY DAY WITH FOOD  . metFORMIN (GLUCOPHAGE) 1000 MG tablet TAKE 1 TABLET BY MOUTH 2 TIMES DAILY WITH A MEAL. (NEEDS TO BE SEEN BEFORE NEXT REFILL)  . Multiple Vitamin (MULTIVITAMIN WITH MINERALS) TABS Take 1 tablet by mouth daily.  Marland Kitchen olmesartan-hydrochlorothiazide (BENICAR HCT) 40-25 MG tablet TAKE 1 TABLET BY MOUTH EACH DAY.  Needs to be seen for further refills.  Marland Kitchen olopatadine (PATANOL) 0.1 % ophthalmic solution PLACE 1 DROP IN [facility] EYE TWICE DAILY  . ONETOUCH DELICA LANCETS FINE MISC TEST UP TO 4 TIMES DIALY  . sertraline (ZOLOFT) 100 MG tablet 2 po qd  .  simvastatin (ZOCOR) 40 MG tablet TAKE 1 TABLET BY MOUTH EVERYDAY AT BEDTIME  . tadalafil (CIALIS) 5 MG tablet Take 1 tablet (5 mg total) by mouth daily as needed for erectile dysfunction.  Marland Kitchen zolpidem (AMBIEN) 10 MG tablet Take 1 tablet (10 mg total) by mouth at bedtime as needed.     Past Surgical History:  Procedure Laterality Date  . BACK SURGERY  [id number]   lumb fusion  . COLONOSCOPY    . KNEE ARTHROSCOPY WITH MEDIAL MENISECTOMY Right 09/26/2013   Procedure: RIGHT KNEE ARTHROSCOPY WITH PARTIAL MEDIAL and lateral chrondroplasty MENISECTOMY;  Surgeon: Lorn Junes, MD;  Location: [place];  Service: Orthopedics;  Laterality: Right;  . left elbow surgery    . LITHOTRIPSY    . NASAL SEPTUM SURGERY    . removal of kidney stones  1981   lt open removal  . SHOULDER ARTHROSCOPY WITH ROTATOR CUFF REPAIR AND SUBACROMIAL DECOMPRESSION Left 09/26/2013   Procedure: LEFT SHOULDER ARTHROSCOPY WITH DEBRIDEMENT, PARTIAL ROTATOR CUFF REPAIR AND SUBACROMIAL DECOMPRESSION AND SAD ACD DISTAL CLAVICULECTOMY;  Surgeon: Lorn Junes, MD;  Location: [place];  Service: Orthopedics;  Laterality: Left;  . TONSILLECTOMY      Family History  Problem Relation Age of Onset  . COPD Mother   . Hypertension Father   . Alzheimer's disease Father   . Diabetes Maternal Grandmother   . Kidney disease Neg Hx   . Prostate cancer Neg Hx     New complaints: None today  Social history: Lives by hisself. Loves to travel  Controlled substance contract: 02/20/20    Review of Systems  Constitutional: Negative for diaphoresis.  Eyes: Negative for pain.  Respiratory: Negative for shortness of breath.   Cardiovascular: Negative for chest pain, palpitations and leg swelling.  Gastrointestinal: Negative for abdominal pain.  Endocrine: Negative for polydipsia.  Skin: Negative for rash.  Neurological: Negative for dizziness, weakness and headaches.  Hematological: Does not  bruise/bleed easily.  All other systems reviewed and are negative.      Objective:   Physical Exam Vitals and nursing note reviewed.  Constitutional:      Appearance: Normal appearance. He is well-developed.  HENT:     Head: Normocephalic.     Nose: Nose normal.  Eyes:     Pupils: Pupils are equal, round, and reactive to light.  Neck:     Thyroid: No thyroid mass or thyromegaly.     Vascular: No carotid bruit or JVD.     Trachea: Phonation normal.  Cardiovascular:     Rate and Rhythm: Normal rate and regular rhythm.  Pulmonary:     Effort: Pulmonary effort is normal. No respiratory distress.     Breath sounds: Normal breath sounds.  Abdominal:  General: Bowel sounds are normal.     Palpations: Abdomen is soft.     Tenderness: There is no abdominal tenderness.  Musculoskeletal:        General: Normal range of motion.     Cervical back: Normal range of motion and neck supple.  Lymphadenopathy:     Cervical: No cervical adenopathy.  Skin:    General: Skin is warm and dry.  Neurological:     Mental Status: He is alert and oriented to person, place, and time.  Psychiatric:        Behavior: Behavior normal.        Thought Content: Thought content normal.        Judgment: Judgment normal.    BP 104/67   Pulse 81   Temp 98.3 F (36.8 C) (Temporal)   Resp 20   Ht _0  (1.778 m)   Wt 224 lb (101.6 kg)   SpO2 96%   BMI 32.14 kg/m   hgba1c 9.4%     Assessment & Plan:  [name] comes in today with chief complaint of Medical Management of Chronic Issues   Diagnosis and orders addressed:  1. Essential hypertension Low sodium diet - CBC with Differential/Platelet - CMP14+EGFR - olmesartan-hydrochlorothiazide (BENICAR HCT) 40-25 MG tablet; TAKE 1 TABLET BY MOUTH EACH DAY.  Needs to be seen for further refills.  Dispense: 90 tablet; Refill: 1  2. Hyperlipidemia with target LDL less than 100 Low fat diet - Lipid panel - esomeprazole (NEXIUM) 40 MG  capsule; Take 1 capsule (40 mg total) by mouth daily at 12 noon.  Dispense: 90 capsule; Refill: 1 - ezetimibe (ZETIA) 10 MG tablet; Take 1 tablet (10 mg total) by mouth daily.  Dispense: 90 tablet; Refill: 1 - simvastatin (ZOCOR) 40 MG tablet; TAKE 1 TABLET BY MOUTH EVERYDAY AT BEDTIME  Dispense: 90 tablet; Refill: 1  3. Type 2 diabetes mellitus with diabetic polyneuropathy, without long-term current use of insulin (HCC) Strict carb counting - Bayer DCA Hb A1c Waived - Microalbumin / creatinine urine ratio - blood glucose meter kit and supplies KIT; Dispense based on patient and insurance preference. Use up to four times daily as directed. (FOR ICD-9 250.00, 250.01).  Dispense: 1 each; Refill: 0 - dapagliflozin propanediol (FARXIGA) 5 MG TABS tablet; Take 1 tablet (5 mg total) by mouth daily before breakfast.  Dispense: 90 tablet; Refill: 1 - metFORMIN (GLUCOPHAGE) 1000 MG tablet; TAKE 1 TABLET BY MOUTH 2 TIMES DAILY WITH A MEAL. (NEEDS TO BE SEEN BEFORE NEXT REFILL)  Dispense: 180 tablet; Refill: 1 - empagliflozin (JARDIANCE) 25 MG TABS tablet; Take 1 tablet (25 mg total) by mouth daily before breakfast.  Dispense: 90 tablet; Refill: 1  4. Neuropathy Do not go barefooted  5. Gastroesophageal reflux disease without esophagitis Avoid spicy foods Do not eat 2 hours prior to bedtime - esomeprazole (NEXIUM) 40 MG capsule; Take 1 capsule (40 mg total) by mouth daily at 12 noon.  Dispense: 90 capsule; Refill: 1 - sertraline (ZOLOFT) 100 MG tablet; 2 po qd  Dispense: 180 tablet; Refill: 1  6. BPH with obstruction/lower urinary tract symptoms Report any problems voiding  7. Primary insomnia Bedtime routine - zolpidem (AMBIEN) 10 MG tablet; Take 1 tablet (10 mg total) by mouth at bedtime as needed.  Dispense: 90 tablet; Refill: 1  8. Recurrent major depressive disorder, in full remission ([place]) Stress management - sertraline (ZOLOFT) 100 MG tablet; 2 po qd  Dispense: 180 tablet; Refill: 1  9.  Chronic bilateral low back pain with bilateral sciatica - diazepam (VALIUM) 5 MG tablet; Take 1-2 tablets (5-10 mg total) by mouth every 6 (six) hours as needed.  Dispense: 180 tablet; Refill: 1 - gabapentin (NEURONTIN) 300 MG capsule; Take 1 capsule (300 mg total) by mouth 2 (two) times daily.  Dispense: 180 capsule; Refill: 1  10. BMI 32.0-32.9,adult Discussed diet and exercise for person with BMI >25 Will recheck weight in 3-6 months    Labs pending Health Maintenance reviewed Diet and exercise encouraged  Follow up plan: 1 month   [place], FNP

## 2020-05-28 NOTE — Patient Instructions (Signed)
Diabetes Mellitus and Foot Care Foot care is an important part of your health, especially when you have diabetes. Diabetes may cause you to have problems because of poor blood flow (circulation) to your feet and legs, which can cause your skin to:  Become thinner and drier.  Break more easily.  Heal more slowly.  Peel and crack. You may also have nerve damage (neuropathy) in your legs and feet, causing decreased feeling in them. This means that you may not notice minor injuries to your feet that could lead to more serious problems. Noticing and addressing any potential problems early is the best way to prevent future foot problems. How to care for your feet Foot hygiene  Wash your feet daily with warm water and mild soap. Do not use hot water. Then, pat your feet and the areas between your toes until they are completely dry. Do not soak your feet as this can dry your skin.  Trim your toenails straight across. Do not dig under them or around the cuticle. File the edges of your nails with an emery board or nail file.  Apply a moisturizing lotion or petroleum jelly to the skin on your feet and to dry, brittle toenails. Use lotion that does not contain alcohol and is unscented. Do not apply lotion between your toes. Shoes and socks  Wear clean socks or stockings every day. Make sure they are not too tight. Do not wear knee-high stockings since they may decrease blood flow to your legs.  Wear shoes that fit properly and have enough cushioning. Always look in your shoes before you put them on to be sure there are no objects inside.  To break in new shoes, wear them for just a few hours a day. This prevents injuries on your feet. Wounds, scrapes, corns, and calluses  Check your feet daily for blisters, cuts, bruises, sores, and redness. If you cannot see the bottom of your feet, use a mirror or ask someone for help.  Do not cut corns or calluses or try to remove them with medicine.  If you  find a minor scrape, cut, or break in the skin on your feet, keep it and the skin around it clean and dry. You may clean these areas with mild soap and water. Do not clean the area with peroxide, alcohol, or iodine.  If you have a wound, scrape, corn, or callus on your foot, look at it several times a day to make sure it is healing and not infected. Check for: ? Redness, swelling, or pain. ? Fluid or blood. ? Warmth. ? Pus or a bad smell. General instructions  Do not cross your legs. This may decrease blood flow to your feet.  Do not use heating pads or hot water bottles on your feet. They may burn your skin. If you have lost feeling in your feet or legs, you may not know this is happening until it is too late.  Protect your feet from hot and cold by wearing shoes, such as at the beach or on hot pavement.  Schedule a complete foot exam at least once a year (annually) or more often if you have foot problems. If you have foot problems, report any cuts, sores, or bruises to your health care provider immediately. Contact a health care provider if:  You have a medical condition that increases your risk of infection and you have any cuts, sores, or bruises on your feet.  You have an injury that is not   healing.  You have redness on your legs or feet.  You feel burning or tingling in your legs or feet.  You have pain or cramps in your legs and feet.  Your legs or feet are numb.  Your feet always feel cold.  You have pain around a toenail. Get help right away if:  You have a wound, scrape, corn, or callus on your foot and: ? You have pain, swelling, or redness that gets worse. ? You have fluid or blood coming from the wound, scrape, corn, or callus. ? Your wound, scrape, corn, or callus feels warm to the touch. ? You have pus or a bad smell coming from the wound, scrape, corn, or callus. ? You have a fever. ? You have a red line going up your leg. Summary  Check your feet every day  for cuts, sores, red spots, swelling, and blisters.  Moisturize feet and legs daily.  Wear shoes that fit properly and have enough cushioning.  If you have foot problems, report any cuts, sores, or bruises to your health care provider immediately.  Schedule a complete foot exam at least once a year (annually) or more often if you have foot problems. This information is not intended to replace advice given to you by your health care provider. Make sure you discuss any questions you have with your health care provider. Document Revised: 08/22/2019 Document Reviewed: 12/31/2016 Elsevier Patient Education  2020 [employer]  

## 2020-05-29 LAB — LIPID PANEL
Chol/HDL Ratio: 4.9 ratio (ref 0.0–5.0)
Cholesterol, Total: 156 mg/dL (ref 100–199)
HDL: 32 mg/dL — ABNORMAL LOW (ref >39)
LDL Chol Calc (NIH): 88 mg/dL (ref 0–99)
Triglycerides: 214 mg/dL — ABNORMAL HIGH (ref 0–149)
VLDL Cholesterol Cal: 36 mg/dL (ref 5–40)

## 2020-05-29 LAB — MICROALBUMIN / CREATININE URINE RATIO
Creatinine, Urine: 160.9 mg/dL
Microalb/Creat Ratio: 6 mg/g creat (ref 0–29)
Microalbumin, Urine: 8.9 ug/mL

## 2020-05-29 LAB — CBC WITH DIFFERENTIAL/PLATELET
Basophils Absolute: 0.1 10*3/uL (ref 0.0–0.2)
Basos: 1 %
EOS (ABSOLUTE): 0.2 10*3/uL (ref 0.0–0.4)
Eos: 3 %
Hematocrit: 37.9 % (ref 37.5–51.0)
Hemoglobin: 12.8 g/dL — ABNORMAL LOW (ref 13.0–17.7)
Immature Grans (Abs): 0.1 10*3/uL (ref 0.0–0.1)
Immature Granulocytes: 1 %
Lymphocytes Absolute: 2.3 10*3/uL (ref 0.7–3.1)
Lymphs: 36 %
MCH: 30.6 pg (ref 26.6–33.0)
MCHC: 33.8 g/dL (ref 31.5–35.7)
MCV: 91 fL (ref 79–97)
Monocytes Absolute: 0.4 10*3/uL (ref 0.1–0.9)
Monocytes: 6 %
Neutrophils Absolute: 3.4 10*3/uL (ref 1.4–7.0)
Neutrophils: 53 %
Platelets: 158 10*3/uL (ref 150–450)
RBC: 4.18 x10E6/uL (ref 4.14–5.80)
RDW: 12.7 % (ref 11.6–15.4)
WBC: 6.4 10*3/uL (ref 3.4–10.8)

## 2020-05-29 LAB — CMP14+EGFR
ALT: 38 IU/L (ref 0–44)
AST: 45 IU/L — ABNORMAL HIGH (ref 0–40)
Albumin/Globulin Ratio: 1.7 (ref 1.2–2.2)
Albumin: 4.3 g/dL (ref 3.8–4.8)
Alkaline Phosphatase: 47 IU/L — ABNORMAL LOW (ref 48–121)
BUN/Creatinine Ratio: 18 (ref 10–24)
BUN: 30 mg/dL — ABNORMAL HIGH (ref 8–27)
Bilirubin Total: 0.6 mg/dL (ref 0.0–1.2)
CO2: 17 mmol/L — ABNORMAL LOW (ref 20–29)
Calcium: 9.7 mg/dL (ref 8.6–10.2)
Chloride: 105 mmol/L (ref 96–106)
Creatinine, Ser: 1.63 mg/dL — ABNORMAL HIGH (ref 0.76–1.27)
GFR calc Af Amer: 52 mL/min/{1.73_m2} — ABNORMAL LOW (ref >59)
GFR calc non Af Amer: 45 mL/min/{1.73_m2} — ABNORMAL LOW (ref >59)
Globulin, Total: 2.6 g/dL (ref 1.5–4.5)
Glucose: 173 mg/dL — ABNORMAL HIGH (ref 65–99)
Potassium: 4.6 mmol/L (ref 3.5–5.2)
Sodium: 138 mmol/L (ref 134–144)
Total Protein: 6.9 g/dL (ref 6.0–8.5)

## 2020-06-10 ENCOUNTER — Other Ambulatory Visit: Payer: Self-pay | Admitting: Nurse Practitioner

## 2020-06-10 DIAGNOSIS — F5101 Primary insomnia: Secondary | ICD-10-CM

## 2020-07-10 ENCOUNTER — Ambulatory Visit: Payer: Self-pay | Admitting: Nurse Practitioner

## 2020-08-14 ENCOUNTER — Other Ambulatory Visit: Payer: Self-pay | Admitting: Nurse Practitioner

## 2020-08-14 DIAGNOSIS — N529 Male erectile dysfunction, unspecified: Secondary | ICD-10-CM

## 2020-08-14 DIAGNOSIS — R3911 Hesitancy of micturition: Secondary | ICD-10-CM

## 2020-08-15 ENCOUNTER — Ambulatory Visit (INDEPENDENT_AMBULATORY_CARE_PROVIDER_SITE_OTHER): Payer: PPO | Admitting: Nurse Practitioner

## 2020-08-15 ENCOUNTER — Encounter: Payer: Self-pay | Admitting: Nurse Practitioner

## 2020-08-15 ENCOUNTER — Other Ambulatory Visit: Payer: Self-pay

## 2020-08-15 VITALS — BP 108/73 | HR 75 | Temp 98.1°F | Resp 20 | Ht 70.0 in | Wt 227.0 lb

## 2020-08-15 DIAGNOSIS — E1142 Type 2 diabetes mellitus with diabetic polyneuropathy: Secondary | ICD-10-CM

## 2020-08-15 LAB — BAYER DCA HB A1C WAIVED: HB A1C (BAYER DCA - WAIVED): 10.1 % — ABNORMAL HIGH (ref <7.0)

## 2020-08-15 MED ORDER — DAPAGLIFLOZIN PROPANEDIOL 10 MG PO TABS: 10.0000 mg | ORAL_TABLET | Freq: Every day | ORAL | 5 refills | Status: DC

## 2020-08-15 NOTE — Progress Notes (Signed)
Subjective:    Patient ID: [name], male    DOB: 05-11-1961, 59 y.o.   MRN: [id number]   Chief Complaint: Medical Management of Chronic Issues   HPI Pt is here today for diabetic follow-up. Pt was seen on 05/28/20 and A1C was elevated to 9.4. Pt was not checking blood sugars because his machine was broken. Did not make any medication changes at that time. Has been checking blood sugars at home now and they have been around 190s but this morning it was 235. Reports he has had a decreased appetite recently so does not believe he has had a high carb intake. Also reports low energy and poor sleeping. Reports maybe 3-4 hours of sleep at night. Had gone off of zoloft for a while but has been feeling more depressed lately. Restarted zoloft about 2 weeks ago.     Review of Systems  Constitutional: Positive for activity change (decreased) and appetite change (decreased).  HENT: Negative.   Eyes: Negative.   Respiratory: Negative.   Cardiovascular: Negative.   Gastrointestinal: Negative.   Endocrine: Negative.   Genitourinary: Negative.   Musculoskeletal: Negative.   Skin: Positive for wound (right ear).  Neurological: Negative.   Psychiatric/Behavioral: Positive for sleep disturbance (insomnia).       Objective:   Physical Exam Vitals and nursing note reviewed.  Constitutional:      Appearance: Normal appearance.  HENT:     Head: Normocephalic.  Cardiovascular:     Rate and Rhythm: Normal rate and regular rhythm.     Pulses: Normal pulses.     Heart sounds: Normal heart sounds.  Pulmonary:     Effort: Pulmonary effort is normal.     Breath sounds: Normal breath sounds.  Abdominal:     General: Bowel sounds are normal.     Palpations: Abdomen is soft.  Musculoskeletal:        General: Normal range of motion.     Cervical back: Normal range of motion.  Skin:    Capillary Refill: Capillary refill takes less than 2 seconds.     Findings: Lesion (right ear) present.    Neurological:     General: No focal deficit present.     Mental Status: He is alert and oriented to person, place, and time.  Psychiatric:        Mood and Affect: Mood normal.        Behavior: Behavior normal.     BP 108/73   Pulse 75   Temp 98.1 F (36.7 C) (Temporal)   Resp 20   Ht 5\' 10"  (1.778 m)   Wt 227 lb (103 kg)   SpO2 96%   BMI 32.57 kg/m      Assessment & Plan:  [name] in today with chief complaint of Medical Management of Chronic Issues   1. Type 2 diabetes mellitus with diabetic polyneuropathy, without long-term current use of insulin ([facility]) Stricter carb counting Increase farxiga to 10mg  daily Will do GLP1 if no better in 1 month - Bayer DCA Hb A1c Waived - dapagliflozin propanediol (FARXIGA) 10 MG TABS tablet; Take 1 tablet (10 mg total) by mouth daily before breakfast.  Dispense: 30 tablet; Refill: 5    The above assessment and management plan was discussed with the patient. The patient verbalized understanding of and has agreed to the management plan. Patient is aware to call the clinic if symptoms persist or worsen. Patient is aware when to return to the clinic for a  follow-up visit. Patient educated on when it is appropriate to go to the emergency department.   [name], FNP

## 2020-08-19 DIAGNOSIS — L821 Other seborrheic keratosis: Secondary | ICD-10-CM | POA: Diagnosis not present

## 2020-08-19 DIAGNOSIS — H61001 Unspecified perichondritis of right external ear: Secondary | ICD-10-CM | POA: Diagnosis not present

## 2020-08-19 DIAGNOSIS — H6002 Abscess of left external ear: Secondary | ICD-10-CM | POA: Diagnosis not present

## 2020-09-12 ENCOUNTER — Other Ambulatory Visit: Payer: Self-pay | Admitting: Nurse Practitioner

## 2020-09-12 DIAGNOSIS — E119 Type 2 diabetes mellitus without complications: Secondary | ICD-10-CM

## 2020-10-02 ENCOUNTER — Encounter: Payer: Self-pay | Admitting: Nurse Practitioner

## 2020-10-02 ENCOUNTER — Other Ambulatory Visit: Payer: Self-pay

## 2020-10-02 ENCOUNTER — Ambulatory Visit (INDEPENDENT_AMBULATORY_CARE_PROVIDER_SITE_OTHER): Payer: PPO | Admitting: Nurse Practitioner

## 2020-10-02 VITALS — BP 110/72 | HR 82 | Temp 98.4°F | Resp 20 | Ht 70.0 in | Wt 223.0 lb

## 2020-10-02 DIAGNOSIS — E785 Hyperlipidemia, unspecified: Secondary | ICD-10-CM | POA: Diagnosis not present

## 2020-10-02 DIAGNOSIS — E1142 Type 2 diabetes mellitus with diabetic polyneuropathy: Secondary | ICD-10-CM

## 2020-10-02 DIAGNOSIS — Z23 Encounter for immunization: Secondary | ICD-10-CM | POA: Diagnosis not present

## 2020-10-02 DIAGNOSIS — I10 Essential (primary) hypertension: Secondary | ICD-10-CM | POA: Diagnosis not present

## 2020-10-02 LAB — BAYER DCA HB A1C WAIVED: HB A1C (BAYER DCA - WAIVED): 8.4 % — ABNORMAL HIGH (ref <7.0)

## 2020-10-02 NOTE — Patient Instructions (Signed)

## 2020-10-02 NOTE — Progress Notes (Signed)
Subjective:    Patient ID: [name], male    DOB: 09/27/1961, 59 y.o.   MRN: [id number]  Chief Complaint: Medical Management of Chronic Issues   HPI Patient here for recheck of HA1C, was 10.1% 1 month ago & Wilder Glade was increased to 10mg . @ home CBG 120-140, has been taking medication as prescribed, has been watching carb and sugar intake, walks daily with dog. Today's HA1C is 8.4%, would like 90 supply of medication. Covid vaccination back in May, is interested in booster.     Review of Systems  Constitutional: Negative.   HENT: Negative.   Eyes: Negative.   Respiratory: Negative.   Cardiovascular: Negative.   Gastrointestinal: Negative.   Endocrine: Negative.   Genitourinary: Negative.   Musculoskeletal: Negative.   Skin: Negative.   Allergic/Immunologic: Negative.   Neurological: Negative.   Hematological: Negative.   Psychiatric/Behavioral: Negative.   All other systems reviewed and are negative.      Objective:   Physical Exam Constitutional:      Appearance: Normal appearance.  HENT:     Head: Normocephalic and atraumatic.     Right Ear: External ear normal.     Left Ear: External ear normal.     Nose: Nose normal.     Mouth/Throat:     Mouth: Mucous membranes are moist.     Pharynx: Oropharynx is clear.  Eyes:     Extraocular Movements: Extraocular movements intact.     Conjunctiva/sclera: Conjunctivae normal.     Pupils: Pupils are equal, round, and reactive to light.  Cardiovascular:     Rate and Rhythm: Normal rate and regular rhythm.     Pulses: Normal pulses.     Heart sounds: Normal heart sounds.  Pulmonary:     Effort: Pulmonary effort is normal.     Breath sounds: Normal breath sounds.  Abdominal:     General: Abdomen is flat.     Palpations: Abdomen is soft.  Musculoskeletal:        General: Normal range of motion.     Cervical back: Normal range of motion.  Skin:    General: Skin is warm and dry.     Capillary Refill: Capillary  refill takes less than 2 seconds.  Neurological:     General: No focal deficit present.     Mental Status: He is alert and oriented to person, place, and time. Mental status is at baseline.  Psychiatric:        Mood and Affect: Mood normal.        Behavior: Behavior normal.        Thought Content: Thought content normal.        Judgment: Judgment normal.   BP 110/72   Pulse 82   Temp 98.4 F (36.9 C) (Temporal)   Resp 20   Ht 5\' 10"  (1.778 m)   Wt 223 lb (101.2 kg)   SpO2 97%   BMI 32.00 kg/m   HGBA1c 8.4%    Assessment & Plan:  [name] in today with chief complaint of Medical Management of Chronic Issues   1. Type 2 diabetes mellitus with diabetic polyneuropathy, without long-term current use of insulin ([place]) Continue medication as prescribed. Continue exercising regularly. Eat a well balanced diet low in carbs and sugar.  - Bayer DCA Hb A1c Waived   3. Hyperlipidemia with target LDL less than 100 Avoid foods high in fat or fried. Avoid fast foods. Take medication as prescribed.  - Lipid panel  The above assessment and management plan was discussed with the patient. The patient verbalized understanding of and has agreed to the management plan. Patient is aware to call the clinic if symptoms persist or worsen. Patient is aware when to return to the clinic for a follow-up visit. Patient educated on when it is appropriate to go to the emergency department.   Mary-Margaret Shameka Aggarwal, FNP   

## 2020-10-03 LAB — CMP14+EGFR
ALT: 37 IU/L (ref 0–44)
AST: 48 IU/L — ABNORMAL HIGH (ref 0–40)
Albumin/Globulin Ratio: 2.1 (ref 1.2–2.2)
Albumin: 4.9 g/dL — ABNORMAL HIGH (ref 3.8–4.8)
Alkaline Phosphatase: 47 IU/L (ref 44–121)
BUN/Creatinine Ratio: 16 (ref 10–24)
BUN: 27 mg/dL (ref 8–27)
Bilirubin Total: 0.4 mg/dL (ref 0.0–1.2)
CO2: 18 mmol/L — ABNORMAL LOW (ref 20–29)
Calcium: 9.7 mg/dL (ref 8.6–10.2)
Chloride: 102 mmol/L (ref 96–106)
Creatinine, Ser: 1.71 mg/dL — ABNORMAL HIGH (ref 0.76–1.27)
GFR calc Af Amer: 49 mL/min/{1.73_m2} — ABNORMAL LOW (ref >59)
GFR calc non Af Amer: 42 mL/min/{1.73_m2} — ABNORMAL LOW (ref >59)
Globulin, Total: 2.3 g/dL (ref 1.5–4.5)
Glucose: 181 mg/dL — ABNORMAL HIGH (ref 65–99)
Potassium: 4.7 mmol/L (ref 3.5–5.2)
Sodium: 140 mmol/L (ref 134–144)
Total Protein: 7.2 g/dL (ref 6.0–8.5)

## 2020-10-03 LAB — CBC WITH DIFFERENTIAL/PLATELET
Basophils Absolute: 0.1 10*3/uL (ref 0.0–0.2)
Basos: 1 %
EOS (ABSOLUTE): 0.2 10*3/uL (ref 0.0–0.4)
Eos: 3 %
Hematocrit: 40.5 % (ref 37.5–51.0)
Hemoglobin: 13.5 g/dL (ref 13.0–17.7)
Immature Grans (Abs): 0 10*3/uL (ref 0.0–0.1)
Immature Granulocytes: 1 %
Lymphocytes Absolute: 2.2 10*3/uL (ref 0.7–3.1)
Lymphs: 33 %
MCH: 30.9 pg (ref 26.6–33.0)
MCHC: 33.3 g/dL (ref 31.5–35.7)
MCV: 93 fL (ref 79–97)
Monocytes Absolute: 0.4 10*3/uL (ref 0.1–0.9)
Monocytes: 6 %
Neutrophils Absolute: 3.8 10*3/uL (ref 1.4–7.0)
Neutrophils: 56 %
Platelets: 158 10*3/uL (ref 150–450)
RBC: 4.37 x10E6/uL (ref 4.14–5.80)
RDW: 12.7 % (ref 11.6–15.4)
WBC: 6.6 10*3/uL (ref 3.4–10.8)

## 2020-10-03 LAB — LIPID PANEL
Chol/HDL Ratio: 3.7 ratio (ref 0.0–5.0)
Cholesterol, Total: 136 mg/dL (ref 100–199)
HDL: 37 mg/dL — ABNORMAL LOW (ref >39)
LDL Chol Calc (NIH): 58 mg/dL (ref 0–99)
Triglycerides: 259 mg/dL — ABNORMAL HIGH (ref 0–149)
VLDL Cholesterol Cal: 41 mg/dL — ABNORMAL HIGH (ref 5–40)

## 2020-10-08 ENCOUNTER — Ambulatory Visit (INDEPENDENT_AMBULATORY_CARE_PROVIDER_SITE_OTHER): Payer: PPO | Admitting: Family Medicine

## 2020-10-08 DIAGNOSIS — M5441 Lumbago with sciatica, right side: Secondary | ICD-10-CM

## 2020-10-08 DIAGNOSIS — M5442 Lumbago with sciatica, left side: Secondary | ICD-10-CM

## 2020-10-08 MED ORDER — PREDNISONE 10 MG (21) PO TBPK: ORAL_TABLET | ORAL | 0 refills | Status: DC

## 2020-10-08 NOTE — Progress Notes (Signed)
Telephone visit  Subjective: CC: back pain PCP: Chevis Pretty, FNP [name] is a 59 y.o. male calls for telephone consult today. Patient provides verbal consent for consult held via phone.  Due to COVID-19 pandemic this visit was conducted virtually. This visit type was conducted due to national recommendations for restrictions regarding the COVID-19 Pandemic (e.g. social distancing, sheltering in place) in an effort to limit this patient's exposure and mitigate transmission in our community. All issues noted in this document were discussed and addressed.  A physical exam was not performed with this format.   Location of patient: home Location of provider: [facility] Others present for call: none  1. Back pain Patient reports that he had surgery on his back 5 years ago and about 1 year ago he was getting back injection.  He fell 3 weeks ago.  He called his neurosurgeon but he cannot get in until Wednesday.  He reports low back pain that is radiating into his legs.  He is taking valium for muscle relaxation.   He denies any fecal incontinence, urinary retention or saddle anesthesia.  No lower extremity weakness but he is experienced some shoulder weakness.  He is unsure if this is related.   ROS: Per HPI  Allergies  Allergen Reactions  . Codeine     anxiety  . Lipitor [Atorvastatin]     Myalgia   Past Medical History:  Diagnosis Date  . Arthritis   . Chronic back pain    stenosis  . Constipation    with pain meds  . Depression    takes Zoloft daily  . Diabetes mellitus without complication ([place])    takes Metformni daily  . Dizziness   . GERD (gastroesophageal reflux disease)    takes Nexium daily  . Headache(784.0)    occasionally  . Hyperlipidemia    takes Zetia daily  . Hypertension    takes Benicar daily  . Impingement syndrome of left shoulder   . Insomnia    takes Ambien nightly  . Joint pain   . Left rotator cuff tear   . Pneumonia 1986   walking   . Seasonal allergies    takes Claritin daily  . Tear of medial meniscus of right knee   . Urinary frequency    takes Flomax daily  . Urinary urgency     Current Outpatient Medications:  .  blood glucose meter kit and supplies KIT, Dispense based on patient and insurance preference. Use up to four times daily as directed. (FOR ICD-9 250.00, 250.01)., Disp: 1 each, Rfl: 0 .  dapagliflozin propanediol (FARXIGA) 10 MG TABS tablet, Take 1 tablet (10 mg total) by mouth daily before breakfast., Disp: 30 tablet, Rfl: 5 .  diazepam (VALIUM) 5 MG tablet, Take 1-2 tablets (5-10 mg total) by mouth every 6 (six) hours as needed., Disp: 180 tablet, Rfl: 1 .  esomeprazole (NEXIUM) 40 MG capsule, Take 1 capsule (40 mg total) by mouth daily at 12 noon., Disp: 90 capsule, Rfl: 1 .  ezetimibe (ZETIA) 10 MG tablet, Take 1 tablet (10 mg total) by mouth daily., Disp: 90 tablet, Rfl: 1 .  fluticasone (FLONASE) 50 MCG/ACT nasal spray, USE 1 SPRAY IN EACH NOSTRIL ONCE DAILY, Disp: 16 g, Rfl: 3 .  gabapentin (NEURONTIN) 300 MG capsule, Take 1 capsule (300 mg total) by mouth 2 (two) times daily., Disp: 180 capsule, Rfl: 1 .  glucose blood (ACCU-CHEK GUIDE) test strip, Check BS up to 4 times daily Dx e11.9, Disp:  400 strip, Rfl: 3 .  hydrocortisone (ANUSOL-HC) 2.5 % rectal cream, Place 1 application rectally 2 (two) times daily., Disp: 30 g, Rfl: 0 .  loratadine (CLARITIN) 10 MG tablet, TAKE ONE (1) TABLET EACH DAY, Disp: 90 tablet, Rfl: 1 .  meloxicam (MOBIC) 15 MG tablet, TAKE 1 TABLET BY MOUTH EVERY DAY WITH FOOD, Disp: 90 tablet, Rfl: 0 .  metFORMIN (GLUCOPHAGE) 1000 MG tablet, TAKE 1 TABLET BY MOUTH 2 TIMES DAILY WITH A MEAL. (NEEDS TO BE SEEN BEFORE NEXT REFILL), Disp: 180 tablet, Rfl: 1 .  Multiple Vitamin (MULTIVITAMIN WITH MINERALS) TABS, Take 1 tablet by mouth daily., Disp: , Rfl:  .  olmesartan-hydrochlorothiazide (BENICAR HCT) 40-25 MG tablet, TAKE 1 TABLET BY MOUTH EACH DAY.  Needs to be seen for further  refills., Disp: 90 tablet, Rfl: 1 .  olopatadine (PATANOL) 0.1 % ophthalmic solution, PLACE 1 DROP IN EACH EYE TWICE DAILY, Disp: 5 mL, Rfl: 0 .  ONETOUCH DELICA LANCETS FINE MISC, TEST UP TO 4 TIMES DIALY, Disp: 100 each, Rfl: 10 .  sertraline (ZOLOFT) 100 MG tablet, 2 po qd, Disp: 180 tablet, Rfl: 1 .  simvastatin (ZOCOR) 40 MG tablet, TAKE 1 TABLET BY MOUTH EVERYDAY AT BEDTIME, Disp: 90 tablet, Rfl: 1 .  tadalafil (CIALIS) 5 MG tablet, TAKE 1 TABLET BY MOUTH DAILY AS NEEDED FOR ERECTILE DYSFUNCTION, Disp: 90 tablet, Rfl: 1 .  zolpidem (AMBIEN) 10 MG tablet, Take 1 tablet (10 mg total) by mouth at bedtime as needed., Disp: 90 tablet, Rfl: 1  Assessment/ Plan: 59 y.o. male   1. Acute bilateral low back pain with bilateral sciatica He has appointment scheduled with his neurosurgeon, which I certainly think is appropriate.  I am going to place him on a course of steroids.  We discussed that this could affect his blood sugar.  He is to continue the prescribed muscle relaxer.  If he is requiring opioids he is going to have to be seen in office for a face-to-face visit as this is both our office policy and legally necessary.  He voiced good understanding of the plan.  He understands red flag signs symptoms warranting further evaluation emergency department.  He will follow up as needed - predniSONE (STERAPRED UNI-PAK 21 TAB) 10 MG (21) TBPK tablet; As directed x 6 days  Dispense: 21 tablet; Refill: 0   Start time: 1:15pm (called, does not ring); 1:16pm (called again, didn't ring but did eventually go to VM, LVM), 5:16pm (called a third time) End time: 5:27pm  Total time spent on patient care (including telephone call/ virtual visit): 11 minutes  [place], [place] [phone]

## 2020-10-15 DIAGNOSIS — M5412 Radiculopathy, cervical region: Secondary | ICD-10-CM | POA: Diagnosis not present

## 2020-10-15 DIAGNOSIS — M47816 Spondylosis without myelopathy or radiculopathy, lumbar region: Secondary | ICD-10-CM | POA: Diagnosis not present

## 2020-10-15 DIAGNOSIS — R200 Anesthesia of skin: Secondary | ICD-10-CM | POA: Diagnosis not present

## 2020-11-12 DIAGNOSIS — M5416 Radiculopathy, lumbar region: Secondary | ICD-10-CM | POA: Diagnosis not present

## 2020-11-12 DIAGNOSIS — Z6832 Body mass index (BMI) 32.0-32.9, adult: Secondary | ICD-10-CM | POA: Diagnosis not present

## 2020-12-02 DIAGNOSIS — M5416 Radiculopathy, lumbar region: Secondary | ICD-10-CM | POA: Diagnosis not present

## 2020-12-02 DIAGNOSIS — M545 Low back pain, unspecified: Secondary | ICD-10-CM | POA: Diagnosis not present

## 2020-12-04 DIAGNOSIS — M5416 Radiculopathy, lumbar region: Secondary | ICD-10-CM | POA: Diagnosis not present

## 2020-12-11 ENCOUNTER — Other Ambulatory Visit: Payer: Self-pay | Admitting: Nurse Practitioner

## 2020-12-11 DIAGNOSIS — E1142 Type 2 diabetes mellitus with diabetic polyneuropathy: Secondary | ICD-10-CM

## 2020-12-11 NOTE — Telephone Encounter (Signed)
The following medications may be covered Using available formulary data, we have determined that the following medications may be covered:  PA Requirement Metformin 500 Mg Tab, PA Not Required Pioglitazone 30 Mg Tab, PA Not Required Invokana 100 Mg Tab, PA Not Required Jardiance 25 Mg Tab, PA Not Required Januvia 100 Mg Tab, PA Not Required Trulicity 1.5 Mg/0.5 Ml Pnij, PA Not Required Lantus Solostar U-100 Insulin, PA Not Required Levemir FlexTouch U-100 Insuln, PA Not Required Ozempic 0.25 Mg Or 0.5 Mg(2 Mg/1.5 Ml) Pnij, PA Not Required Victoza 2-Pak, PA Not Required Victoza 3-Pak, PA Not Required Rybelsus 7 Mg Tab, PA Not Required   On metformin Tried: jardiance  Bolded meds above have not been tried and are recommended.   will cont with PA and see if we can get it covered. Key: [id number] Sent to Plan today

## 2020-12-11 NOTE — Telephone Encounter (Signed)
Pharmacy comment:  Alternative Requested: DRUG NOT COVERED ON NEW INSURANCE WITHOUT PRIOR AUTHORIZATION;  PLEASE CALL INSURANCE OR SEND ALTERNATIVE.

## 2020-12-12 ENCOUNTER — Other Ambulatory Visit: Payer: Self-pay | Admitting: Nurse Practitioner

## 2020-12-12 DIAGNOSIS — F5101 Primary insomnia: Secondary | ICD-10-CM

## 2020-12-12 DIAGNOSIS — G8929 Other chronic pain: Secondary | ICD-10-CM

## 2020-12-15 ENCOUNTER — Telehealth: Payer: Self-pay | Admitting: Nurse Practitioner

## 2020-12-15 DIAGNOSIS — F5101 Primary insomnia: Secondary | ICD-10-CM

## 2020-12-15 MED ORDER — ZOLPIDEM TARTRATE 10 MG PO TABS: 10.0000 mg | ORAL_TABLET | Freq: Every evening | ORAL | 1 refills | Status: DC | PRN

## 2020-12-15 NOTE — Telephone Encounter (Signed)
PA Case: [id number], Status: Approved, Coverage Starts on: 12/11/2020 12:00:00 AM, Coverage Ends on: 12/12/2021 12:00:00 AM.   Pharmacy aware.

## 2020-12-15 NOTE — Telephone Encounter (Signed)
Has appt 01/02/21, doesn't appear to be on schedule with med refill on [place]

## 2020-12-15 NOTE — Telephone Encounter (Signed)
  Prescription Request  12/15/2020  What is the name of the medication or equipment? Gabapentin andzolpidem  Have you contacted your pharmacy to request a refill? (if applicable) yes  Which pharmacy would you like this sent to? [facility] in [place]   Patient notified that their request is being sent to the clinical staff for review and that they should receive a response within 2 business days.

## 2020-12-23 DIAGNOSIS — M5416 Radiculopathy, lumbar region: Secondary | ICD-10-CM | POA: Diagnosis not present

## 2021-01-02 ENCOUNTER — Ambulatory Visit: Payer: Self-pay | Admitting: Nurse Practitioner

## 2021-01-09 ENCOUNTER — Telehealth: Payer: Self-pay | Admitting: *Deleted

## 2021-01-09 DIAGNOSIS — E1142 Type 2 diabetes mellitus with diabetic polyneuropathy: Secondary | ICD-10-CM

## 2021-01-09 NOTE — Telephone Encounter (Signed)
[employer] form filled out and MMM to sign on Monday - then to be faxed back . Placed on MMM desk - with note of directions. (should come back to PA box once faxed)

## 2021-01-13 NOTE — Telephone Encounter (Signed)
Faxed to [employer] on 01/12/21.  Today - DENIED fax came in   Note - this med is not eligible for tier exception.  Also not on formulary / covered drug.  Please advise on possible changes.Marland Kitchen

## 2021-01-21 DIAGNOSIS — M5416 Radiculopathy, lumbar region: Secondary | ICD-10-CM | POA: Diagnosis not present

## 2021-01-22 ENCOUNTER — Ambulatory Visit (INDEPENDENT_AMBULATORY_CARE_PROVIDER_SITE_OTHER): Payer: PPO | Admitting: Nurse Practitioner

## 2021-01-22 ENCOUNTER — Encounter: Payer: Self-pay | Admitting: Nurse Practitioner

## 2021-01-22 ENCOUNTER — Other Ambulatory Visit: Payer: Self-pay

## 2021-01-22 VITALS — BP 118/82 | HR 96 | Temp 98.3°F | Ht 70.0 in | Wt 220.8 lb

## 2021-01-22 DIAGNOSIS — N401 Enlarged prostate with lower urinary tract symptoms: Secondary | ICD-10-CM

## 2021-01-22 DIAGNOSIS — I10 Essential (primary) hypertension: Secondary | ICD-10-CM | POA: Diagnosis not present

## 2021-01-22 DIAGNOSIS — M5442 Lumbago with sciatica, left side: Secondary | ICD-10-CM | POA: Diagnosis not present

## 2021-01-22 DIAGNOSIS — Z6832 Body mass index (BMI) 32.0-32.9, adult: Secondary | ICD-10-CM | POA: Diagnosis not present

## 2021-01-22 DIAGNOSIS — K219 Gastro-esophageal reflux disease without esophagitis: Secondary | ICD-10-CM

## 2021-01-22 DIAGNOSIS — R3911 Hesitancy of micturition: Secondary | ICD-10-CM

## 2021-01-22 DIAGNOSIS — N138 Other obstructive and reflux uropathy: Secondary | ICD-10-CM

## 2021-01-22 DIAGNOSIS — G629 Polyneuropathy, unspecified: Secondary | ICD-10-CM

## 2021-01-22 DIAGNOSIS — E1142 Type 2 diabetes mellitus with diabetic polyneuropathy: Secondary | ICD-10-CM | POA: Diagnosis not present

## 2021-01-22 DIAGNOSIS — E785 Hyperlipidemia, unspecified: Secondary | ICD-10-CM

## 2021-01-22 DIAGNOSIS — F5101 Primary insomnia: Secondary | ICD-10-CM

## 2021-01-22 DIAGNOSIS — F3342 Major depressive disorder, recurrent, in full remission: Secondary | ICD-10-CM

## 2021-01-22 DIAGNOSIS — M5441 Lumbago with sciatica, right side: Secondary | ICD-10-CM

## 2021-01-22 DIAGNOSIS — N529 Male erectile dysfunction, unspecified: Secondary | ICD-10-CM

## 2021-01-22 DIAGNOSIS — G8929 Other chronic pain: Secondary | ICD-10-CM

## 2021-01-22 LAB — BAYER DCA HB A1C WAIVED: HB A1C (BAYER DCA - WAIVED): 8.3 % — ABNORMAL HIGH (ref <7.0)

## 2021-01-22 MED ORDER — EZETIMIBE 10 MG PO TABS
10.0000 mg | ORAL_TABLET | Freq: Every day | ORAL | 1 refills | Status: DC
Start: 2021-01-22 — End: 2021-07-09

## 2021-01-22 MED ORDER — TADALAFIL 5 MG PO TABS: ORAL_TABLET | ORAL | 1 refills | Status: DC

## 2021-01-22 MED ORDER — ZOLPIDEM TARTRATE 10 MG PO TABS: 10.0000 mg | ORAL_TABLET | Freq: Every evening | ORAL | 1 refills | Status: DC | PRN

## 2021-01-22 MED ORDER — METFORMIN HCL 1000 MG PO TABS: ORAL_TABLET | ORAL | 1 refills | Status: DC

## 2021-01-22 MED ORDER — OLMESARTAN MEDOXOMIL-HCTZ 40-25 MG PO TABS: ORAL_TABLET | ORAL | 1 refills | Status: DC

## 2021-01-22 MED ORDER — DIAZEPAM 5 MG PO TABS: 5.0000 mg | ORAL_TABLET | Freq: Four times a day (QID) | ORAL | 1 refills | Status: DC | PRN

## 2021-01-22 MED ORDER — ESOMEPRAZOLE MAGNESIUM 40 MG PO CPDR: 40.0000 mg | DELAYED_RELEASE_CAPSULE | Freq: Every day | ORAL | 1 refills | Status: DC

## 2021-01-22 MED ORDER — SERTRALINE HCL 100 MG PO TABS: ORAL_TABLET | ORAL | 1 refills | Status: DC

## 2021-01-22 MED ORDER — DAPAGLIFLOZIN PROPANEDIOL 10 MG PO TABS
10.0000 mg | ORAL_TABLET | Freq: Every day | ORAL | 1 refills | Status: DC
Start: 2021-01-22 — End: 2021-07-09

## 2021-01-22 MED ORDER — DAPAGLIFLOZIN PROPANEDIOL 10 MG PO TABS: ORAL_TABLET | ORAL | 5 refills | Status: DC

## 2021-01-22 MED ORDER — SIMVASTATIN 40 MG PO TABS
ORAL_TABLET | ORAL | 1 refills | Status: DC
Start: 2021-01-22 — End: 2021-07-09

## 2021-01-22 NOTE — Patient Instructions (Signed)
Diabetes Mellitus and Foot Care Foot care is an important part of your health, especially when you have diabetes. Diabetes may cause you to have problems because of poor blood flow (circulation) to your feet and legs, which can cause your skin to:  Become thinner and drier.  Break more easily.  Heal more slowly.  Peel and crack. You may also have nerve damage (neuropathy) in your legs and feet, causing decreased feeling in them. This means that you may not notice minor injuries to your feet that could lead to more serious problems. Noticing and addressing any potential problems early is the best way to prevent future foot problems. How to care for your feet Foot hygiene  Wash your feet daily with warm water and mild soap. Do not use hot water. Then, pat your feet and the areas between your toes until they are completely dry. Do not soak your feet as this can dry your skin.  Trim your toenails straight across. Do not dig under them or around the cuticle. File the edges of your nails with an emery board or nail file.  Apply a moisturizing lotion or petroleum jelly to the skin on your feet and to dry, brittle toenails. Use lotion that does not contain alcohol and is unscented. Do not apply lotion between your toes.   Shoes and socks  Wear clean socks or stockings every day. Make sure they are not too tight. Do not wear knee-high stockings since they may decrease blood flow to your legs.  Wear shoes that fit properly and have enough cushioning. Always look in your shoes before you put them on to be sure there are no objects inside.  To break in new shoes, wear them for just a few hours a day. This prevents injuries on your feet. Wounds, scrapes, corns, and calluses  Check your feet daily for blisters, cuts, bruises, sores, and redness. If you cannot see the bottom of your feet, use a mirror or ask someone for help.  Do not cut corns or calluses or try to remove them with medicine.  If you  find a minor scrape, cut, or break in the skin on your feet, keep it and the skin around it clean and dry. You may clean these areas with mild soap and water. Do not clean the area with peroxide, alcohol, or iodine.  If you have a wound, scrape, corn, or callus on your foot, look at it several times a day to make sure it is healing and not infected. Check for: ? Redness, swelling, or pain. ? Fluid or blood. ? Warmth. ? Pus or a bad smell.   General tips  Do not cross your legs. This may decrease blood flow to your feet.  Do not use heating pads or hot water bottles on your feet. They may burn your skin. If you have lost feeling in your feet or legs, you may not know this is happening until it is too late.  Protect your feet from hot and cold by wearing shoes, such as at the beach or on hot pavement.  Schedule a complete foot exam at least once a year (annually) or more often if you have foot problems. Report any cuts, sores, or bruises to your health care provider immediately. Where to find more information  American Diabetes Association: www.diabetes.org  Association of Diabetes Care & Education Specialists: www.diabeteseducator.org Contact a health care provider if:  You have a medical condition that increases your risk of infection and   you have any cuts, sores, or bruises on your feet.  You have an injury that is not healing.  You have redness on your legs or feet.  You feel burning or tingling in your legs or feet.  You have pain or cramps in your legs and feet.  Your legs or feet are numb.  Your feet always feel cold.  You have pain around any toenails. Get help right away if:  You have a wound, scrape, corn, or callus on your foot and: ? You have pain, swelling, or redness that gets worse. ? You have fluid or blood coming from the wound, scrape, corn, or callus. ? Your wound, scrape, corn, or callus feels warm to the touch. ? You have pus or a bad smell coming from  the wound, scrape, corn, or callus. ? You have a fever. ? You have a red line going up your leg. Summary  Check your feet every day for blisters, cuts, bruises, sores, and redness.  Apply a moisturizing lotion or petroleum jelly to the skin on your feet and to dry, brittle toenails.  Wear shoes that fit properly and have enough cushioning.  If you have foot problems, report any cuts, sores, or bruises to your health care provider immediately.  Schedule a complete foot exam at least once a year (annually) or more often if you have foot problems. This information is not intended to replace advice given to you by your health care provider. Make sure you discuss any questions you have with your health care provider. Document Revised: 06/19/2020 Document Reviewed: 06/19/2020 Elsevier Patient Education  2021 [employer]  

## 2021-01-22 NOTE — Progress Notes (Signed)
Subjective:    Patient ID: [name], male    DOB: June 01, 1961, 60 y.o.   MRN: [id number]   Chief Complaint: Follow up for chronic disease management.    HPI:  1. Type 2 diabetes mellitus with diabetic polyneuropathy, without long-term current use of insulin (HCC) FBG at home have been in the low 100's. Does not eat breakfast regularly, sometimes going until the afternoon. Believes he is eating well, except on his recent vacation. He recognizes he has not been eating well lately. Had an injection in his back in January, thinks it may have been steroid shot.   Lab Results  Component Value Date   HGBA1C 8.4 (H) 10/02/2020     3. Hyperlipidemia with target LDL less than 100 Has not been eating low fat diet. Not exercising currently.   Taking simvastatin and zetia with no SE.   Lab Results  Component Value Date   CHOL 136 10/02/2020   HDL 37 (L) 10/02/2020   LDLCALC 58 10/02/2020   TRIG 259 (H) 10/02/2020   CHOLHDL 3.7 10/02/2020    4. Primary hypertension HTN being managed with olmasartan. No issues with the medication. Monitors BP at home.   BP Readings from Last 3 Encounters:  01/22/21 118/82  10/02/20 110/72  08/15/20 108/73    5. Gastroesophageal reflux disease without esophagitis  Taking nexium with no complications. Is working well for him. Staying away acidic foods.   6. BPH with obstruction/lower urinary tract symptoms  On Cialis for management takes daily. Has been working well to manage both ED and BPH.   7. Neuropathy Has pain episodes at night mainly. Taking Gabapentin. Working well. Helps him sleep.   8. Recurrent major depressive disorder, in full remission ([place])  Continues on sertraline with no complications.   Depression screen [facility] 2/9 01/22/2021 01/22/2021 01/22/2021  Decreased Interest 3 0 -  Down, Depressed, Hopeless 0 0 0  PHQ - 2 Score 3 0 0  Altered sleeping 3 0 -  Tired, decreased energy 1 0 -  Change in appetite 1 0 -  Feeling bad  or failure about yourself  0 0 -  Trouble concentrating 1 0 -  Moving slowly or fidgety/restless 0 0 -  Suicidal thoughts 0 0 -  PHQ-9 Score 9 0 -  Difficult doing work/chores Somewhat difficult Not difficult at all -  Some recent data might be hidden    9. Chronic bilateral low back pain with bilateral sciatica Managing back pain with neurology. Having several procedures done to help with pain.   10. Erectile dysfunction of organic origin Takes Cialis daily. Working well.  11. BMI 32.0-32.9,adult Not really watching his diet. Wants to join the gym again when his back starts to feel better.   Wt Readings from Last 3 Encounters:  01/22/21 220 lb 12.8 oz (100.2 kg)  10/02/20 223 lb (101.2 kg)  08/15/20 227 lb (103 kg)    12. Primary Insomnia On Ambien. Takes nightly. Working well.    Outpatient Encounter Medications as of 01/22/2021  Medication Sig  . blood glucose meter kit and supplies KIT Dispense based on patient and insurance preference. Use up to four times daily as directed. (FOR ICD-9 250.00, 250.01).  . diazepam (VALIUM) 5 MG tablet Take 1-2 tablets (5-10 mg total) by mouth every 6 (six) hours as needed.  Marland Kitchen esomeprazole (NEXIUM) 40 MG capsule Take 1 capsule (40 mg total) by mouth daily at 12 noon.  . ezetimibe (ZETIA) 10 MG tablet  Take 1 tablet (10 mg total) by mouth daily.  Marland Kitchen FARXIGA 10 MG TABS tablet TAKE 1 TABLET (10 MG TOTAL) BY MOUTH DAILY BEFORE BREAKFAST.  . fluticasone (FLONASE) 50 MCG/ACT nasal spray USE 1 SPRAY IN EACH NOSTRIL ONCE DAILY  . gabapentin (NEURONTIN) 300 MG capsule TAKE 1 CAPSULE BY MOUTH TWICE A DAY  . glucose blood (ACCU-CHEK GUIDE) test strip Check BS up to 4 times daily Dx e11.9  . hydrocortisone (ANUSOL-HC) 2.5 % rectal cream Place 1 application rectally 2 (two) times daily.  Marland Kitchen loratadine (CLARITIN) 10 MG tablet TAKE ONE (1) TABLET EACH DAY  . meloxicam (MOBIC) 15 MG tablet TAKE 1 TABLET BY MOUTH EVERY DAY WITH FOOD  . metFORMIN (GLUCOPHAGE)  1000 MG tablet TAKE 1 TABLET BY MOUTH 2 TIMES DAILY WITH A MEAL. (NEEDS TO BE SEEN BEFORE NEXT REFILL)  . Multiple Vitamin (MULTIVITAMIN WITH MINERALS) TABS Take 1 tablet by mouth daily.  Marland Kitchen olmesartan-hydrochlorothiazide (BENICAR HCT) 40-25 MG tablet TAKE 1 TABLET BY MOUTH EACH DAY.  Needs to be seen for further refills.  Marland Kitchen olopatadine (PATANOL) 0.1 % ophthalmic solution PLACE 1 DROP IN [facility] TWICE DAILY  . ONETOUCH DELICA LANCETS FINE MISC TEST UP TO 4 TIMES DIALY  . predniSONE (STERAPRED UNI-PAK 21 TAB) 10 MG (21) TBPK tablet As directed x 6 days  . sertraline (ZOLOFT) 100 MG tablet 2 po qd  . simvastatin (ZOCOR) 40 MG tablet TAKE 1 TABLET BY MOUTH EVERYDAY AT BEDTIME  . tadalafil (CIALIS) 5 MG tablet TAKE 1 TABLET BY MOUTH DAILY AS NEEDED FOR ERECTILE DYSFUNCTION  . zolpidem (AMBIEN) 10 MG tablet Take 1 tablet (10 mg total) by mouth at bedtime as needed.   No facility-administered encounter medications on file as of 01/22/2021.    Past Surgical History:  Procedure Laterality Date  . BACK SURGERY  [id number]   lumb fusion  . COLONOSCOPY    . KNEE ARTHROSCOPY WITH MEDIAL MENISECTOMY Right 09/26/2013   Procedure: RIGHT KNEE ARTHROSCOPY WITH PARTIAL MEDIAL and lateral chrondroplasty MENISECTOMY;  Surgeon: Lorn Junes, MD;  Location: [place];  Service: Orthopedics;  Laterality: Right;  . left elbow surgery    . LITHOTRIPSY    . NASAL SEPTUM SURGERY    . removal of kidney stones  1981   lt open removal  . SHOULDER ARTHROSCOPY WITH ROTATOR CUFF REPAIR AND SUBACROMIAL DECOMPRESSION Left 09/26/2013   Procedure: LEFT SHOULDER ARTHROSCOPY WITH DEBRIDEMENT, PARTIAL ROTATOR CUFF REPAIR AND SUBACROMIAL DECOMPRESSION AND SAD ACD DISTAL CLAVICULECTOMY;  Surgeon: Lorn Junes, MD;  Location: [place];  Service: Orthopedics;  Laterality: Left;  . TONSILLECTOMY      Family History  Problem Relation Age of Onset  . COPD Mother   . Hypertension Father   .  Alzheimer's disease Father   . Diabetes Maternal Grandmother   . Kidney disease Neg Hx   . Prostate cancer Neg Hx     New complaints: None  Social history:  Lives alone  Controlled substance contract: N/A     Review of Systems  Constitutional: Negative for activity change, appetite change, chills and fever.  Respiratory: Negative for cough, shortness of breath and wheezing.   Cardiovascular: Negative for chest pain and palpitations.  Gastrointestinal: Negative for abdominal distention, constipation, diarrhea and nausea.  Genitourinary: Negative for difficulty urinating, dysuria and hematuria.  Neurological: Negative for dizziness and headaches.  Psychiatric/Behavioral: Negative for behavioral problems and confusion. The patient is not nervous/anxious.  Objective:   Physical Exam Cardiovascular:     Rate and Rhythm: Normal rate and regular rhythm.     Pulses: Normal pulses.     Heart sounds: Normal heart sounds.  Pulmonary:     Effort: Pulmonary effort is normal.     Breath sounds: Normal breath sounds.  Abdominal:     General: Bowel sounds are normal.     Palpations: Abdomen is soft.  Musculoskeletal:     Cervical back: Normal range of motion and neck supple.  Skin:    General: Skin is warm and dry.     Capillary Refill: Capillary refill takes less than 2 seconds.  Neurological:     General: No focal deficit present.     Mental Status: He is alert and oriented to person, place, and time.  Psychiatric:        Mood and Affect: Mood normal.        Behavior: Behavior normal.        Thought Content: Thought content normal.        Judgment: Judgment normal.           Assessment & Plan:   CORNELIS KLUVER comes in today with chief complaint of No chief complaint on file.   Diagnosis and orders addressed:  1. Type 2 diabetes mellitus with diabetic polyneuropathy, without long-term current use of insulin (HCC) Today's A1C is 8.3% Begin low carb diet.  Food diary would be a great tool to count carbs and calories. If his A1c is not below 8.0% next visit, consider starting Ozempic.   - Bayer DCA Hb A1c Waived - dapagliflozin propanediol (FARXIGA) 10 MG TABS tablet; TAKE 1 TABLET (10 MG TOTAL) BY MOUTH DAILY BEFORE BREAKFAST.  Dispense: 30 tablet; Refill: 5 - metFORMIN (GLUCOPHAGE) 1000 MG tablet; TAKE 1 TABLET BY MOUTH 2 TIMES DAILY WITH A MEAL. (NEEDS TO BE SEEN BEFORE NEXT REFILL)  Dispense: 180 tablet; Refill: 1  2. Essential hypertension Continue medications as prescribed. Try and start exercising as tolerated.   - olmesartan-hydrochlorothiazide (BENICAR HCT) 40-25 MG tablet; TAKE 1 TABLET BY MOUTH EACH DAY.  Needs to be seen for further refills.  Dispense: 90 tablet; Refill: 1  3. Hyperlipidemia with target LDL less than 100 Low fat diet and exercise. Continue medications as prescribed.   - Lipid panel - esomeprazole (NEXIUM) 40 MG capsule; Take 1 capsule (40 mg total) by mouth daily at 12 noon.  Dispense: 90 capsule; Refill: 1 - simvastatin (ZOCOR) 40 MG tablet; TAKE 1 TABLET BY MOUTH EVERYDAY AT BEDTIME  Dispense: 90 tablet; Refill: 1 - ezetimibe (ZETIA) 10 MG tablet; Take 1 tablet (10 mg total) by mouth daily.  Dispense: 90 tablet; Refill: 1  4. Primary hypertension Continue medications as prescribed. Try and start exercising as tolerated - CBC with Differential/Platelet - CMP14+EGFR  5. Gastroesophageal reflux disease without esophagitis Continue medication as prescribed. Avoid eating within 2 hours prior to bedtime.   - esomeprazole (NEXIUM) 40 MG capsule; Take 1 capsule (40 mg total) by mouth daily at 12 noon.  Dispense: 90 capsule; Refill: 1 - sertraline (ZOLOFT) 100 MG tablet; 2 po qd  Dispense: 180 tablet; Refill: 1  6. BPH with obstruction/lower urinary tract symptoms Continue Cialis.   7. Neuropathy Continue gabapentin. Check feet often for injury and swelling.   8. Recurrent major depressive disorder, in full  remission ([place]) Continue Zoloft. Alert provider if depression severity increases.  - sertraline (ZOLOFT) 100 MG tablet; 2 po qd  Dispense:  180 tablet; Refill: 1  9. Chronic bilateral low back pain with bilateral sciatica Sees neurology for management.   - diazepam (VALIUM) 5 MG tablet; Take 1-2 tablets (5-10 mg total) by mouth every 6 (six) hours as needed.  Dispense: 180 tablet; Refill: 1  10. Erectile dysfunction of organic origin Continue Cialis  - tadalafil (CIALIS) 5 MG tablet; TAKE 1 TABLET BY MOUTH DAILY AS NEEDED FOR ERECTILE DYSFUNCTION  Dispense: 90 tablet; Refill: 1  11. BMI 32.0-32.9,adult Begin low fat diet and increase exercise as tolerated.   12. Primary insomnia Begin trying good sleep habits. Dark, cool room, turn off the television. When taking his Ambien, go directly to bed.   - zolpidem (AMBIEN) 10 MG tablet; Take 1 tablet (10 mg total) by mouth at bedtime as needed.  Dispense: 90 tablet; Refill: 1  13. Benign prostatic hyperplasia with urinary hesitancy Continue Cialis for management.   - tadalafil (CIALIS) 5 MG tablet; TAKE 1 TABLET BY MOUTH DAILY AS NEEDED FOR ERECTILE DYSFUNCTION  Dispense: 90 tablet; Refill: 1   Labs pending Health Maintenance reviewed Diet and exercise encouraged  Follow up plan: 3 months  Dollene Primrose, RN, BSN, FNP-Student  [name], FNP

## 2021-01-23 LAB — CMP14+EGFR
ALT: 30 IU/L (ref 0–44)
AST: 27 IU/L (ref 0–40)
Albumin/Globulin Ratio: 2.3 — ABNORMAL HIGH (ref 1.2–2.2)
Albumin: 4.8 g/dL (ref 3.8–4.8)
Alkaline Phosphatase: 55 IU/L (ref 44–121)
BUN/Creatinine Ratio: 21 (ref 10–24)
BUN: 30 mg/dL — ABNORMAL HIGH (ref 8–27)
Bilirubin Total: 0.4 mg/dL (ref 0.0–1.2)
CO2: 16 mmol/L — ABNORMAL LOW (ref 20–29)
Calcium: 9.4 mg/dL (ref 8.6–10.2)
Chloride: 100 mmol/L (ref 96–106)
Creatinine, Ser: 1.43 mg/dL — ABNORMAL HIGH (ref 0.76–1.27)
GFR calc Af Amer: 61 mL/min/{1.73_m2} (ref >59)
GFR calc non Af Amer: 52 mL/min/{1.73_m2} — ABNORMAL LOW (ref >59)
Globulin, Total: 2.1 g/dL (ref 1.5–4.5)
Glucose: 307 mg/dL — ABNORMAL HIGH (ref 65–99)
Potassium: 5.2 mmol/L (ref 3.5–5.2)
Sodium: 136 mmol/L (ref 134–144)
Total Protein: 6.9 g/dL (ref 6.0–8.5)

## 2021-01-23 LAB — CBC WITH DIFFERENTIAL/PLATELET
Basophils Absolute: 0 10*3/uL (ref 0.0–0.2)
Basos: 0 %
EOS (ABSOLUTE): 0 10*3/uL (ref 0.0–0.4)
Eos: 0 %
Hematocrit: 38.7 % (ref 37.5–51.0)
Hemoglobin: 13.5 g/dL (ref 13.0–17.7)
Immature Grans (Abs): 0 10*3/uL (ref 0.0–0.1)
Immature Granulocytes: 0 %
Lymphocytes Absolute: 2.7 10*3/uL (ref 0.7–3.1)
Lymphs: 23 %
MCH: 31.3 pg (ref 26.6–33.0)
MCHC: 34.9 g/dL (ref 31.5–35.7)
MCV: 90 fL (ref 79–97)
Monocytes Absolute: 0.5 10*3/uL (ref 0.1–0.9)
Monocytes: 4 %
Neutrophils Absolute: 8.5 10*3/uL — ABNORMAL HIGH (ref 1.4–7.0)
Neutrophils: 73 %
Platelets: 158 10*3/uL (ref 150–450)
RBC: 4.31 x10E6/uL (ref 4.14–5.80)
RDW: 12.2 % (ref 11.6–15.4)
WBC: 11.8 10*3/uL — ABNORMAL HIGH (ref 3.4–10.8)

## 2021-01-23 LAB — LIPID PANEL
Chol/HDL Ratio: 2.8 ratio (ref 0.0–5.0)
Cholesterol, Total: 108 mg/dL (ref 100–199)
HDL: 39 mg/dL — ABNORMAL LOW (ref >39)
LDL Chol Calc (NIH): 47 mg/dL (ref 0–99)
Triglycerides: 120 mg/dL (ref 0–149)
VLDL Cholesterol Cal: 22 mg/dL (ref 5–40)

## 2021-04-07 ENCOUNTER — Other Ambulatory Visit: Payer: Self-pay | Admitting: Nurse Practitioner

## 2021-04-07 DIAGNOSIS — G8929 Other chronic pain: Secondary | ICD-10-CM

## 2021-04-07 DIAGNOSIS — M5442 Lumbago with sciatica, left side: Secondary | ICD-10-CM

## 2021-04-14 DIAGNOSIS — Z20822 Contact with and (suspected) exposure to covid-19: Secondary | ICD-10-CM | POA: Diagnosis not present

## 2021-04-21 ENCOUNTER — Ambulatory Visit: Payer: Self-pay | Admitting: Nurse Practitioner

## 2021-04-24 ENCOUNTER — Encounter: Payer: Self-pay | Admitting: Nurse Practitioner

## 2021-05-28 DIAGNOSIS — Z6831 Body mass index (BMI) 31.0-31.9, adult: Secondary | ICD-10-CM | POA: Diagnosis not present

## 2021-05-28 DIAGNOSIS — Z981 Arthrodesis status: Secondary | ICD-10-CM | POA: Diagnosis not present

## 2021-06-09 ENCOUNTER — Other Ambulatory Visit: Payer: Self-pay | Admitting: *Deleted

## 2021-06-09 DIAGNOSIS — M5442 Lumbago with sciatica, left side: Secondary | ICD-10-CM

## 2021-06-09 MED ORDER — GABAPENTIN 300 MG PO CAPS: 300.0000 mg | ORAL_CAPSULE | Freq: Two times a day (BID) | ORAL | 0 refills | Status: DC

## 2021-06-17 ENCOUNTER — Ambulatory Visit (INDEPENDENT_AMBULATORY_CARE_PROVIDER_SITE_OTHER): Payer: PPO

## 2021-06-17 VITALS — Ht 70.0 in | Wt 216.0 lb

## 2021-06-17 DIAGNOSIS — Z0000 Encounter for general adult medical examination without abnormal findings: Secondary | ICD-10-CM | POA: Diagnosis not present

## 2021-06-17 NOTE — Progress Notes (Signed)
Subjective:   [name] is a 60 y.o. male who presents for an Initial Medicare Annual Wellness Visit.  Virtual Visit via Telephone Note  I connected with  [name] on 06/17/21 at  1:15 PM EDT by telephone and verified that I am speaking with the correct person using two identifiers.  Location: Patient: Home Provider: [facility] Persons participating in the virtual visit: patient/Nurse Health Advisor   I discussed the limitations, risks, security and privacy concerns of performing an evaluation and management service by telephone and the availability of in person appointments. The patient expressed understanding and agreed to proceed.  Interactive audio and video telecommunications were attempted between this nurse and patient, however failed, due to patient having technical difficulties OR patient did not have access to video capability.  We continued and completed visit with audio only.  Some vital signs may be absent or patient reported.   [name] E [name], LPN   Review of Systems     Cardiac Risk Factors include: advanced age (>72mn, >>22women);diabetes mellitus;obesity (BMI >30kg/m2);dyslipidemia;hypertension;male gender     Objective:    Today's Vitals   06/17/21 1332  Weight: 216 lb (98 kg)  Height: '5\' 10"'  (1.778 m)   Body mass index is 30.99 kg/m.  Advanced Directives 06/17/2021 10/28/2017 01/19/2017 09/26/2013 09/24/2013 12/22/2012  Does Patient Have a Medical Advance Directive? No No No Patient does not have advance directive Patient does not have advance directive;Patient would not like information Patient does not have advance directive;Patient would like information  Would patient like information on creating a medical advance directive? No - Patient declined - Yes (MAU/Ambulatory/Procedural Areas - Information given) - - -  Pre-existing out of facility DNR order (yellow form or pink MOST form) - - - No - No    Current Medications (verified) Outpatient Encounter  Medications as of 06/17/2021  Medication Sig   blood glucose meter kit and supplies KIT Dispense based on patient and insurance preference. Use up to four times daily as directed. (FOR ICD-9 250.00, 250.01).   cyclobenzaprine (FLEXERIL) 10 MG tablet Take 10 mg by mouth 3 (three) times daily.   dapagliflozin propanediol (FARXIGA) 10 MG TABS tablet Take 1 tablet (10 mg total) by mouth daily. TAKE 1 TABLET (10 MG TOTAL) BY MOUTH DAILY BEFORE BREAKFAST.   diazepam (VALIUM) 5 MG tablet Take 1-2 tablets (5-10 mg total) by mouth every 6 (six) hours as needed.   esomeprazole (NEXIUM) 40 MG capsule Take 1 capsule (40 mg total) by mouth daily at 12 noon.   ezetimibe (ZETIA) 10 MG tablet Take 1 tablet (10 mg total) by mouth daily.   fluticasone (FLONASE) 50 MCG/ACT nasal spray USE 1 SPRAY IN EACH NOSTRIL ONCE DAILY (Patient not taking: Reported on 01/22/2021)   gabapentin (NEURONTIN) 300 MG capsule Take 1 capsule (300 mg total) by mouth 2 (two) times daily. (NEEDS TO BE SEEN BEFORE NEXT REFILL)   glucose blood (ACCU-CHEK GUIDE) test strip Check BS up to 4 times daily Dx e11.9   hydrocortisone (ANUSOL-HC) 2.5 % rectal cream Place 1 application rectally 2 (two) times daily. (Patient not taking: Reported on 01/22/2021)   loratadine (CLARITIN) 10 MG tablet TAKE ONE (1) TABLET EACH DAY   meloxicam (MOBIC) 15 MG tablet TAKE 1 TABLET BY MOUTH EVERY DAY WITH FOOD (Patient not taking: Reported on 01/22/2021)   metFORMIN (GLUCOPHAGE) 1000 MG tablet TAKE 1 TABLET BY MOUTH 2 TIMES DAILY WITH A MEAL. (NEEDS TO BE SEEN BEFORE NEXT REFILL)  Multiple Vitamin (MULTIVITAMIN WITH MINERALS) TABS Take 1 tablet by mouth daily.   olmesartan-hydrochlorothiazide (BENICAR HCT) 40-25 MG tablet TAKE 1 TABLET BY MOUTH EACH DAY.  Needs to be seen for further refills.   olopatadine (PATANOL) 0.1 % ophthalmic solution PLACE 1 DROP IN [facility] EYE TWICE DAILY (Patient not taking: Reported on 3/81/8299)   ONETOUCH DELICA LANCETS FINE MISC TEST UP TO  4 TIMES DIALY   sertraline (ZOLOFT) 100 MG tablet 2 po qd   simvastatin (ZOCOR) 40 MG tablet TAKE 1 TABLET BY MOUTH EVERYDAY AT BEDTIME   tadalafil (CIALIS) 5 MG tablet TAKE 1 TABLET BY MOUTH DAILY AS NEEDED FOR ERECTILE DYSFUNCTION   zolpidem (AMBIEN) 10 MG tablet Take 1 tablet (10 mg total) by mouth at bedtime as needed.   No facility-administered encounter medications on file as of 06/17/2021.    Allergies (verified) Acetaminophen, Codeine, and Lipitor [atorvastatin]   History: Past Medical History:  Diagnosis Date   Arthritis    Chronic back pain    stenosis   Constipation    with pain meds   Depression    takes Zoloft daily   Diabetes mellitus without complication (HCC)    takes Metformni daily   Dizziness    GERD (gastroesophageal reflux disease)    takes Nexium daily   Headache(784.0)    occasionally   Hyperlipidemia    takes Zetia daily   Hypertension    takes Benicar daily   Impingement syndrome of left shoulder    Insomnia    takes Ambien nightly   Joint pain    Left rotator cuff tear    Pneumonia 1986   walking   Seasonal allergies    takes Claritin daily   Tear of medial meniscus of right knee    Urinary frequency    takes Flomax daily   Urinary urgency    Past Surgical History:  Procedure Laterality Date   BACK SURGERY  [id number]   lumb fusion   COLONOSCOPY     KNEE ARTHROSCOPY WITH MEDIAL MENISECTOMY Right 09/26/2013   Procedure: RIGHT KNEE ARTHROSCOPY WITH PARTIAL MEDIAL and lateral chrondroplasty MENISECTOMY;  Surgeon: Lorn Junes, MD;  Location: [facility];  Service: Orthopedics;  Laterality: Right;   left elbow surgery     LITHOTRIPSY     NASAL SEPTUM SURGERY     removal of kidney stones  1981   lt open removal   SHOULDER ARTHROSCOPY WITH ROTATOR CUFF REPAIR AND SUBACROMIAL DECOMPRESSION Left 09/26/2013   Procedure: LEFT SHOULDER ARTHROSCOPY WITH DEBRIDEMENT, PARTIAL ROTATOR CUFF REPAIR AND SUBACROMIAL DECOMPRESSION AND SAD  ACD DISTAL CLAVICULECTOMY;  Surgeon: Lorn Junes, MD;  Location: [facility];  Service: Orthopedics;  Laterality: Left;   TONSILLECTOMY     Family History  Problem Relation Age of Onset   COPD Mother    Hypertension Father    Alzheimer's disease Father    Diabetes Maternal Grandmother    Kidney disease Neg Hx    Prostate cancer Neg Hx    Social History   Socioeconomic History   Marital status: Divorced    Spouse name: Not on file   Number of children: Not on file   Years of education: Not on file   Highest education level: Not on file  Occupational History   Occupation: retired  Tobacco Use   Smoking status: Never   Smokeless tobacco: Never  Vaping Use   Vaping Use: Never used  Substance and Sexual Activity   Alcohol  use: Yes    Comment: rarely   Drug use: No   Sexual activity: Yes  Other Topics Concern   Not on file  Social History Narrative   Lives alone. Daughter lives nearby   Social Determinants of Health   Financial Resource Strain: Low Risk    Difficulty of Paying Living Expenses: Not hard at all  Food Insecurity: No Food Insecurity   Worried About Charity fundraiser in the Last Year: Never true   Arboriculturist in the Last Year: Never true  Transportation Needs: No Transportation Needs   Lack of Transportation (Medical): No   Lack of Transportation (Non-Medical): No  Physical Activity: Sufficiently Active   Days of Exercise per Week: 7 days   Minutes of Exercise per Session: 30 min  Stress: No Stress Concern Present   Feeling of Stress : Not at all  Social Connections: Moderately Isolated   Frequency of Communication with Friends and Family: More than three times a week   Frequency of Social Gatherings with Friends and Family: More than three times a week   Attends Religious Services: More than 4 times per year   Active Member of [employer] or Organizations: No   Attends Music therapist: Never   Marital Status: Divorced     Tobacco Counseling Counseling given: Not Answered   Clinical Intake:  Pre-visit preparation completed: Yes  Pain : No/denies pain     BMI - recorded: 30.99 Nutritional Status: BMI > 30  Obese Nutritional Risks: None Diabetes: Yes CBG done?: No Did pt. bring in CBG monitor from home?: No  How often do you need to have someone help you when you read instructions, pamphlets, or other written materials from your doctor or pharmacy?: 1 - Never  Nutrition Risk Assessment:  Has the patient had any N/V/D within the last 2 months?  No  Does the patient have any non-healing wounds?  No  Has the patient had any unintentional weight loss or weight gain?  No   Diabetes:  Is the patient diabetic?  No  If diabetic, was a CBG obtained today?  No  Did the patient bring in their glucometer from home?  No  How often do you monitor your CBG's? Once or twice per week.   Financial Strains and Diabetes Management:  Are you having any financial strains with the device, your supplies or your medication?  Wilder Glade is $200/ 3 months, but he thinks our pharmacist checked to see if we could get it cheaper already - he says he can manage it for now .  Does the patient want to be seen by Chronic Care Management for management of their diabetes?  No  Would the patient like to be referred to a Nutritionist or for Diabetic Management?  No   Diabetic Exams:  Diabetic Eye Exam: Completed 2051. Overdue for diabetic eye exam. Pt has been advised about the importance in completing this exam. He says he will make an appointment soon.  Diabetic Foot Exam: Completed 01/22/21. Pt has been advised about the importance in completing this exam. Pt is scheduled for diabetic foot exam on 06/29/21.    Interpreter Needed?: No  Information entered by :: Natasa Stigall, LPN   Activities of Daily Living In your present state of health, do you have any difficulty performing the following activities: 06/17/2021  10/02/2020  Hearing? N N  Vision? N N  Difficulty concentrating or making decisions? N N  Walking or climbing stairs?  N N  Dressing or bathing? N N  Doing errands, shopping? N N  Preparing Food and eating ? N -  Using the Toilet? N -  In the past six months, have you accidently leaked urine? N -  Do you have problems with loss of bowel control? N -  Managing your Medications? N -  Managing your Finances? N -  Housekeeping or managing your Housekeeping? N -  Some recent data might be hidden    Patient Care Team: Chevis Pretty, FNP as PCP - General (Nurse Practitioner)  Indicate any recent Medical Services you may have received from other than [facility] providers in the past year (date may be approximate).     Assessment:   This is a routine wellness examination for [place].  Hearing/Vision screen Hearing Screening - Comments:: Denies hearing difficulties  Vision Screening - Comments:: Denies vision difficulties. Hasn't seen eye doctor in years. Plans to make appt soon.  Dietary issues and exercise activities discussed: Current Exercise Habits: Home exercise routine, Type of exercise: walking, Time (Minutes): 30, Frequency (Times/Week): 7, Weekly Exercise (Minutes/Week): 210, Intensity: Mild, Exercise limited by: None identified   Goals Addressed             This Visit's Progress    HEMOGLOBIN A1C < 7         Depression Screen PHQ 2/9 Scores 06/17/2021 01/22/2021 01/22/2021 01/22/2021 10/02/2020 05/28/2020 02/15/2020  PHQ - 2 Score 0 3 0 0 0 0 2  PHQ- 9 Score - 9 0 - 0 0 5    Fall Risk Fall Risk  06/17/2021 01/22/2021 10/02/2020 05/28/2020 02/15/2020  Falls in the past year? 0 0 1 0 0  Number falls in past yr: 0 0 0 - -  Injury with Fall? 0 0 0 - -  Risk for fall due to : No Fall Risks No Fall Risks History of fall(s) - -  Follow up Falls prevention discussed Education provided Education provided - -    FALL RISK PREVENTION PERTAINING TO THE HOME:  Any stairs in or around  the home? Yes  If so, are there any without handrails? No  Home free of loose throw rugs in walkways, pet beds, electrical cords, etc? Yes  Adequate lighting in your home to reduce risk of falls? Yes   ASSISTIVE DEVICES UTILIZED TO PREVENT FALLS:  Life alert? No  Use of a cane, walker or w/c? No  Grab bars in the bathroom? No  Shower chair or bench in shower? No  Elevated toilet seat or a handicapped toilet? No   TIMED UP AND GO:  Was the test performed? No . Telephonic visit.  Cognitive Function: Normal cognitive status assessed by direct observation by this Nurse Health Advisor. No abnormalities found.      6CIT Screen 06/17/2021  What Year? 0 points  What month? 0 points  What time? 0 points  Count back from 20 0 points  Months in reverse 0 points  Repeat phrase 0 points  Total Score 0    Immunizations Immunization History  Administered Date(s) Administered   Influenza,inj,Quad PF,6+ Mos 10/15/2014, 11/20/2015, 10/11/2016, 10/17/2017, 11/28/2018, 10/24/2019, 10/02/2020   PFIZER(Purple Top)SARS-COV-2 Vaccination 04/12/2020, 05/03/2020   Tdap 07/25/2013    TDAP status: Up to date  Flu Vaccine status: Up to date  Pneumococcal vaccine status: Due, Education has been provided regarding the importance of this vaccine. Advised may receive this vaccine at local pharmacy or Health Dept. Aware to provide a copy of the vaccination  record if obtained from local pharmacy or Health Dept. Verbalized acceptance and understanding.  Covid-19 vaccine status: Completed vaccines  Qualifies for Shingles Vaccine? Yes   Zostavax completed No   Shingrix Completed?: No.    Education has been provided regarding the importance of this vaccine. Patient has been advised to call insurance company to determine out of pocket expense if they have not yet received this vaccine. Advised may also receive vaccine at local pharmacy or Health Dept. Verbalized acceptance and understanding.  Screening  Tests Health Maintenance  Topic Date Due   PNEUMOCOCCAL POLYSACCHARIDE VACCINE AGE 46-64 HIGH RISK  Never done   HIV Screening  Never done   Zoster Vaccines- Shingrix (1 of 2) Never done   OPHTHALMOLOGY EXAM  10/16/2015   COVID-19 Vaccine (3 - Booster for Pfizer series) 10/03/2020   INFLUENZA VACCINE  07/13/2021   HEMOGLOBIN A1C  07/22/2021   Fecal DNA (Cologuard)  10/04/2021   FOOT EXAM  01/22/2022   TETANUS/TDAP  07/26/2023   Hepatitis C Screening  Completed   Pneumococcal Vaccine 32-66 Years old  Aged Out   HPV VACCINES  Aged Out    Health Maintenance  Health Maintenance Due  Topic Date Due   PNEUMOCOCCAL POLYSACCHARIDE VACCINE AGE 46-64 HIGH RISK  Never done   HIV Screening  Never done   Zoster Vaccines- Shingrix (1 of 2) Never done   OPHTHALMOLOGY EXAM  10/16/2015   COVID-19 Vaccine (3 - Booster for Pfizer series) 10/03/2020    Colorectal cancer screening: Type of screening: Cologuard. Completed 10/04/2018. Repeat every 3 years  Lung Cancer Screening: (Low Dose CT Chest recommended if Age 41-80 years, 30 pack-year currently smoking OR have quit w/in 15years.) does not qualify.   Additional Screening:  Hepatitis C Screening: does qualify; Completed 11/20/2015  Vision Screening: Recommended annual ophthalmology exams for early detection of glaucoma and other disorders of the eye. Is the patient up to date with their annual eye exam?  No  Who is the provider or what is the name of the office in which the patient attends annual eye exams? N/a If pt is not established with a provider, would they like to be referred to a provider to establish care? No .   Dental Screening: Recommended annual dental exams for proper oral hygiene  Community Resource Referral / Chronic Care Management: CRR required this visit?  No   CCM required this visit?  No      Plan:     I have personally reviewed and noted the following in the patient's chart:   Medical and social history Use of  alcohol, tobacco or illicit drugs  Current medications and supplements including opioid prescriptions. Patient is not currently taking opioid prescriptions. Functional ability and status Nutritional status Physical activity Advanced directives List of other physicians Hospitalizations, surgeries, and ER visits in previous 12 months Vitals Screenings to include cognitive, depression, and falls Referrals and appointments  In addition, I have reviewed and discussed with patient certain preventive protocols, quality metrics, and best practice recommendations. A written personalized care plan for preventive services as well as general preventive health recommendations were provided to patient.     Sandrea Hammond, LPN   06/18/9389   Nurse Notes: None

## 2021-06-17 NOTE — Patient Instructions (Signed)
[name] , Thank you for taking time to come for your Medicare Wellness Visit. I appreciate your ongoing commitment to your health goals. Please review the following plan we discussed and let me know if I can assist you in the future.   Screening recommendations/referrals: Colonoscopy: Cologuard done 10/04/2018 - Repeat every 23years  Recommended yearly ophthalmology/optometry visit for glaucoma screening and checkup Recommended yearly dental visit for hygiene and checkup  Vaccinations: Influenza vaccine: Done 10/02/2020 - Repeat annually  Pneumococcal vaccine: Due. 2 vaccines one year apart Tdap vaccine: Done 07/25/2013 - Repeat in 10 years  Shingles vaccine: Due. Shingrix discussed. Please contact your pharmacy for coverage information.     Covid-19: Done 04/12/20 & 05/03/20 - need date of booster  Advanced directives: Please bring a copy of your health care power of attorney and living will to the office to be added to your chart at your convenience.  Conditions/risks identified: Aim for 30 minutes of exercise or brisk walking each day, drink 6-8 glasses of water and eat lots of fruits and vegetables.   Next appointment: Follow up in one year for your annual wellness visit   Preventive Care 40-64 Years, Male Preventive care refers to lifestyle choices and visits with your health care provider that can promote health and wellness. What does preventive care include? A yearly physical exam. This is also called an annual well check. Dental exams once or twice a year. Routine eye exams. Ask your health care provider how often you should have your eyes checked. Personal lifestyle choices, including: Daily care of your teeth and gums. Regular physical activity. Eating a healthy diet. Avoiding tobacco and drug use. Limiting alcohol use. Practicing safe sex. Taking low-dose aspirin every day starting at age 32. What happens during an annual well check? The services and screenings done by  your health care provider during your annual well check will depend on your age, overall health, lifestyle risk factors, and family history of disease. Counseling  Your health care provider may ask you questions about your: Alcohol use. Tobacco use. Drug use. Emotional well-being. Home and relationship well-being. Sexual activity. Eating habits. Work and work Statistician. Screening  You may have the following tests or measurements: Height, weight, and BMI. Blood pressure. Lipid and cholesterol levels. These may be checked every 5 years, or more frequently if you are over 32 years old. Skin check. Lung cancer screening. You may have this screening every year starting at age 65 if you have a 30-pack-year history of smoking and currently smoke or have quit within the past 15 years. Fecal occult blood test (FOBT) of the stool. You may have this test every year starting at age 64. Flexible sigmoidoscopy or colonoscopy. You may have a sigmoidoscopy every 5 years or a colonoscopy every 10 years starting at age 18. Prostate cancer screening. Recommendations will vary depending on your family history and other risks. Hepatitis C blood test. Hepatitis B blood test. Sexually transmitted disease (STD) testing. Diabetes screening. This is done by checking your blood sugar (glucose) after you have not eaten for a while (fasting). You may have this done every 1-3 years. Discuss your test results, treatment options, and if necessary, the need for more tests with your health care provider. Vaccines  Your health care provider may recommend certain vaccines, such as: Influenza vaccine. This is recommended every year. Tetanus, diphtheria, and acellular pertussis (Tdap, Td) vaccine. You may need a Td booster every 10 years. Zoster vaccine. You may need this after  age 34. Pneumococcal 13-valent conjugate (PCV13) vaccine. You may need this if you have certain conditions and have not been  vaccinated. Pneumococcal polysaccharide (PPSV23) vaccine. You may need one or two doses if you smoke cigarettes or if you have certain conditions. Talk to your health care provider about which screenings and vaccines you need and how often you need them. This information is not intended to replace advice given to you by your health care provider. Make sure you discuss any questions you have with your health care provider. Document Released: 12/26/2015 Document Revised: 08/18/2016 Document Reviewed: 09/30/2015 Elsevier Interactive Patient Education  2017 [employer] Prevention in the Home Falls can cause injuries. They can happen to people of all ages. There are many things you can do to make your home safe and to help prevent falls. What can I do on the outside of my home? Regularly fix the edges of walkways and driveways and fix any cracks. Remove anything that might make you trip as you walk through a door, such as a raised step or threshold. Trim any bushes or trees on the path to your home. Use bright outdoor lighting. Clear any walking paths of anything that might make someone trip, such as rocks or tools. Regularly check to see if handrails are loose or broken. Make sure that both sides of any steps have handrails. Any raised decks and porches should have guardrails on the edges. Have any leaves, snow, or ice cleared regularly. Use sand or salt on walking paths during winter. Clean up any spills in your garage right away. This includes oil or grease spills. What can I do in the bathroom? Use night lights. Install grab bars by the toilet and in the tub and shower. Do not use towel bars as grab bars. Use non-skid mats or decals in the tub or shower. If you need to sit down in the shower, use a plastic, non-slip stool. Keep the floor dry. Clean up any water that spills on the floor as soon as it happens. Remove soap buildup in the tub or shower regularly. Attach bath mats  securely with double-sided non-slip rug tape. Do not have throw rugs and other things on the floor that can make you trip. What can I do in the bedroom? Use night lights. Make sure that you have a light by your bed that is easy to reach. Do not use any sheets or blankets that are too big for your bed. They should not hang down onto the floor. Have a firm chair that has side arms. You can use this for support while you get dressed. Do not have throw rugs and other things on the floor that can make you trip. What can I do in the kitchen? Clean up any spills right away. Avoid walking on wet floors. Keep items that you use a lot in easy-to-reach places. If you need to reach something above you, use a strong step stool that has a grab bar. Keep electrical cords out of the way. Do not use floor polish or wax that makes floors slippery. If you must use wax, use non-skid floor wax. Do not have throw rugs and other things on the floor that can make you trip. What can I do with my stairs? Do not leave any items on the stairs. Make sure that there are handrails on both sides of the stairs and use them. Fix handrails that are broken or loose. Make sure that handrails are as long  as the stairways. Check any carpeting to make sure that it is firmly attached to the stairs. Fix any carpet that is loose or worn. Avoid having throw rugs at the top or bottom of the stairs. If you do have throw rugs, attach them to the floor with carpet tape. Make sure that you have a light switch at the top of the stairs and the bottom of the stairs. If you do not have them, ask someone to add them for you. What else can I do to help prevent falls? Wear shoes that: Do not have high heels. Have rubber bottoms. Are comfortable and fit you well. Are closed at the toe. Do not wear sandals. If you use a stepladder: Make sure that it is fully opened. Do not climb a closed stepladder. Make sure that both sides of the stepladder  are locked into place. Ask someone to hold it for you, if possible. Clearly mark and make sure that you can see: Any grab bars or handrails. First and last steps. Where the edge of each step is. Use tools that help you move around (mobility aids) if they are needed. These include: Canes. Walkers. Scooters. Crutches. Turn on the lights when you go into a dark area. Replace any light bulbs as soon as they burn out. Set up your furniture so you have a clear path. Avoid moving your furniture around. If any of your floors are uneven, fix them. If there are any pets around you, be aware of where they are. Review your medicines with your doctor. Some medicines can make you feel dizzy. This can increase your chance of falling. Ask your doctor what other things that you can do to help prevent falls. This information is not intended to replace advice given to you by your health care provider. Make sure you discuss any questions you have with your health care provider. Document Released: 09/25/2009 Document Revised: 05/06/2016 Document Reviewed: 01/03/2015 Elsevier Interactive Patient Education  2017 [employer].

## 2021-07-02 ENCOUNTER — Ambulatory Visit: Payer: PPO | Admitting: Nurse Practitioner

## 2021-07-09 ENCOUNTER — Ambulatory Visit (INDEPENDENT_AMBULATORY_CARE_PROVIDER_SITE_OTHER): Payer: PPO | Admitting: Nurse Practitioner

## 2021-07-09 ENCOUNTER — Encounter: Payer: Self-pay | Admitting: Nurse Practitioner

## 2021-07-09 ENCOUNTER — Other Ambulatory Visit: Payer: Self-pay

## 2021-07-09 VITALS — BP 95/69 | HR 73 | Temp 98.3°F | Resp 20 | Ht 70.0 in | Wt 215.0 lb

## 2021-07-09 DIAGNOSIS — I10 Essential (primary) hypertension: Secondary | ICD-10-CM | POA: Diagnosis not present

## 2021-07-09 DIAGNOSIS — N138 Other obstructive and reflux uropathy: Secondary | ICD-10-CM

## 2021-07-09 DIAGNOSIS — N401 Enlarged prostate with lower urinary tract symptoms: Secondary | ICD-10-CM | POA: Diagnosis not present

## 2021-07-09 DIAGNOSIS — K219 Gastro-esophageal reflux disease without esophagitis: Secondary | ICD-10-CM | POA: Diagnosis not present

## 2021-07-09 DIAGNOSIS — F3342 Major depressive disorder, recurrent, in full remission: Secondary | ICD-10-CM | POA: Diagnosis not present

## 2021-07-09 DIAGNOSIS — E785 Hyperlipidemia, unspecified: Secondary | ICD-10-CM

## 2021-07-09 DIAGNOSIS — F5101 Primary insomnia: Secondary | ICD-10-CM | POA: Diagnosis not present

## 2021-07-09 DIAGNOSIS — E1142 Type 2 diabetes mellitus with diabetic polyneuropathy: Secondary | ICD-10-CM | POA: Diagnosis not present

## 2021-07-09 DIAGNOSIS — G629 Polyneuropathy, unspecified: Secondary | ICD-10-CM | POA: Diagnosis not present

## 2021-07-09 DIAGNOSIS — M5442 Lumbago with sciatica, left side: Secondary | ICD-10-CM | POA: Diagnosis not present

## 2021-07-09 DIAGNOSIS — M5441 Lumbago with sciatica, right side: Secondary | ICD-10-CM

## 2021-07-09 DIAGNOSIS — G8929 Other chronic pain: Secondary | ICD-10-CM

## 2021-07-09 DIAGNOSIS — Z6830 Body mass index (BMI) 30.0-30.9, adult: Secondary | ICD-10-CM | POA: Diagnosis not present

## 2021-07-09 LAB — CMP14+EGFR
ALT: 30 IU/L (ref 0–44)
AST: 42 IU/L — ABNORMAL HIGH (ref 0–40)
Albumin/Globulin Ratio: 2.2 (ref 1.2–2.2)
Albumin: 5 g/dL — ABNORMAL HIGH (ref 3.8–4.8)
Alkaline Phosphatase: 52 IU/L (ref 44–121)
BUN/Creatinine Ratio: 19 (ref 10–24)
BUN: 32 mg/dL — ABNORMAL HIGH (ref 8–27)
Bilirubin Total: 0.4 mg/dL (ref 0.0–1.2)
CO2: 20 mmol/L (ref 20–29)
Calcium: 9.9 mg/dL (ref 8.6–10.2)
Chloride: 97 mmol/L (ref 96–106)
Creatinine, Ser: 1.68 mg/dL — ABNORMAL HIGH (ref 0.76–1.27)
Globulin, Total: 2.3 g/dL (ref 1.5–4.5)
Glucose: 176 mg/dL — ABNORMAL HIGH (ref 65–99)
Potassium: 4.9 mmol/L (ref 3.5–5.2)
Sodium: 134 mmol/L (ref 134–144)
Total Protein: 7.3 g/dL (ref 6.0–8.5)
eGFR: 46 mL/min/{1.73_m2} — ABNORMAL LOW (ref >59)

## 2021-07-09 LAB — CBC WITH DIFFERENTIAL/PLATELET
Basophils Absolute: 0.1 10*3/uL (ref 0.0–0.2)
Basos: 1 %
EOS (ABSOLUTE): 0.2 10*3/uL (ref 0.0–0.4)
Eos: 3 %
Hematocrit: 41.5 % (ref 37.5–51.0)
Hemoglobin: 14.2 g/dL (ref 13.0–17.7)
Immature Grans (Abs): 0 10*3/uL (ref 0.0–0.1)
Immature Granulocytes: 0 %
Lymphocytes Absolute: 3 10*3/uL (ref 0.7–3.1)
Lymphs: 43 %
MCH: 31.5 pg (ref 26.6–33.0)
MCHC: 34.2 g/dL (ref 31.5–35.7)
MCV: 92 fL (ref 79–97)
Monocytes Absolute: 0.4 10*3/uL (ref 0.1–0.9)
Monocytes: 6 %
Neutrophils Absolute: 3.3 10*3/uL (ref 1.4–7.0)
Neutrophils: 47 %
Platelets: 156 10*3/uL (ref 150–450)
RBC: 4.51 x10E6/uL (ref 4.14–5.80)
RDW: 12.3 % (ref 11.6–15.4)
WBC: 7 10*3/uL (ref 3.4–10.8)

## 2021-07-09 LAB — LIPID PANEL
Chol/HDL Ratio: 3 ratio (ref 0.0–5.0)
Cholesterol, Total: 120 mg/dL (ref 100–199)
HDL: 40 mg/dL (ref >39)
LDL Chol Calc (NIH): 53 mg/dL (ref 0–99)
Triglycerides: 157 mg/dL — ABNORMAL HIGH (ref 0–149)
VLDL Cholesterol Cal: 27 mg/dL (ref 5–40)

## 2021-07-09 LAB — BAYER DCA HB A1C WAIVED: HB A1C (BAYER DCA - WAIVED): 10.5 % — ABNORMAL HIGH (ref <7.0)

## 2021-07-09 MED ORDER — DIAZEPAM 5 MG PO TABS: 5.0000 mg | ORAL_TABLET | Freq: Four times a day (QID) | ORAL | 1 refills | Status: DC | PRN

## 2021-07-09 MED ORDER — OZEMPIC (0.25 OR 0.5 MG/DOSE) 2 MG/1.5ML ~~LOC~~ SOPN: PEN_INJECTOR | SUBCUTANEOUS | 2 refills | Status: DC

## 2021-07-09 MED ORDER — GABAPENTIN 300 MG PO CAPS: 300.0000 mg | ORAL_CAPSULE | Freq: Two times a day (BID) | ORAL | 0 refills | Status: DC

## 2021-07-09 MED ORDER — SERTRALINE HCL 100 MG PO TABS: ORAL_TABLET | ORAL | 1 refills | Status: DC

## 2021-07-09 MED ORDER — SIMVASTATIN 40 MG PO TABS: ORAL_TABLET | ORAL | 1 refills | Status: DC

## 2021-07-09 MED ORDER — METFORMIN HCL 1000 MG PO TABS: ORAL_TABLET | ORAL | 1 refills | Status: DC

## 2021-07-09 MED ORDER — DAPAGLIFLOZIN PROPANEDIOL 10 MG PO TABS: 10.0000 mg | ORAL_TABLET | Freq: Every day | ORAL | 1 refills | Status: DC

## 2021-07-09 MED ORDER — ESOMEPRAZOLE MAGNESIUM 40 MG PO CPDR: 40.0000 mg | DELAYED_RELEASE_CAPSULE | Freq: Every day | ORAL | 1 refills | Status: DC

## 2021-07-09 MED ORDER — OLMESARTAN MEDOXOMIL-HCTZ 40-25 MG PO TABS: ORAL_TABLET | ORAL | 1 refills | Status: DC

## 2021-07-09 MED ORDER — EZETIMIBE 10 MG PO TABS: 10.0000 mg | ORAL_TABLET | Freq: Every day | ORAL | 1 refills | Status: DC

## 2021-07-09 MED ORDER — ZOLPIDEM TARTRATE 10 MG PO TABS: 10.0000 mg | ORAL_TABLET | Freq: Every evening | ORAL | 1 refills | Status: DC | PRN

## 2021-07-09 NOTE — Progress Notes (Signed)
Subjective:    Patient ID: [name], male    DOB: 05/21/1961, 60 y.o.   MRN: [id number]  Chief Complaint: Medical Management of Chronic Issues    HPI:  1. Type 2 diabetes mellitus with diabetic polyneuropathy, without long-term current use of insulin (HCC) Fatsing blood sugars are up and down. Does not watch diet every closely. Discussed low carb diet at last viist. Lab Results  Component Value Date   HGBA1C 8.3 (H) 01/22/2021     2. primary hypertension No c/o chest pain, sob or headache. Does not check blood pressure at home. BP Readings from Last 3 Encounters:  07/09/21 95/69  01/22/21 118/82  10/02/20 110/72     3. Hyperlipidemia with target LDL less than 100 Doe snot watch diet  and has not been doing any dedicated exercise. Lab Results  Component Value Date   CHOL 108 01/22/2021   HDL 39 (L) 01/22/2021   LDLCALC 47 01/22/2021   TRIG 120 01/22/2021   CHOLHDL 2.8 01/22/2021     4. Gastroesophageal reflux disease without esophagitis I son nexium diakly and has occasional breakthrough symptoms.  5. Neuropathy No numbness on tingling or burning of feet.   6. BPH with obstruction/lower urinary tract symptoms No problems voiding . Has not seen urology in over 2 years  20. Recurrent major depressive disorder, in full remission ([place]) Is on zoloft an dis doing well. Depression screen [facility] 2/9 07/09/2021 06/17/2021 01/22/2021  Decreased Interest 1 0 3  Down, Depressed, Hopeless 1 0 0  PHQ - 2 Score 2 0 3  Altered sleeping 0 - 3  Tired, decreased energy 1 - 1  Change in appetite 2 - 1  Feeling bad or failure about yourself  0 - 0  Trouble concentrating 0 - 1  Moving slowly or fidgety/restless 0 - 0  Suicidal thoughts 0 - 0  PHQ-9 Score 5 - 9  Difficult doing work/chores Not difficult at all - Somewhat difficult  Some recent data might be hidden     8. Primary insomnia Is on ambien to sleeps. Sleeps about 6-8 hours a night.  9 BMI 30.0-30.9 No recent  weight changes Wt Readings from Last 3 Encounters:  07/09/21 215 lb (97.5 kg)  06/17/21 216 lb (98 kg)  01/22/21 220 lb 12.8 oz (100.2 kg)   BMI Readings from Last 3 Encounters:  07/09/21 30.85 kg/m  06/17/21 30.99 kg/m  01/22/21 31.68 kg/m       Outpatient Encounter Medications as of 07/09/2021  Medication Sig   blood glucose meter kit and supplies KIT Dispense based on patient and insurance preference. Use up to four times daily as directed. (FOR ICD-9 250.00, 250.01).   cyclobenzaprine (FLEXERIL) 10 MG tablet Take 10 mg by mouth 3 (three) times daily.   dapagliflozin propanediol (FARXIGA) 10 MG TABS tablet Take 1 tablet (10 mg total) by mouth daily. TAKE 1 TABLET (10 MG TOTAL) BY MOUTH DAILY BEFORE BREAKFAST.   diazepam (VALIUM) 5 MG tablet Take 1-2 tablets (5-10 mg total) by mouth every 6 (six) hours as needed.   esomeprazole (NEXIUM) 40 MG capsule Take 1 capsule (40 mg total) by mouth daily at 12 noon.   ezetimibe (ZETIA) 10 MG tablet Take 1 tablet (10 mg total) by mouth daily.   fluticasone (FLONASE) 50 MCG/ACT nasal spray USE 1 SPRAY IN EACH NOSTRIL ONCE DAILY   gabapentin (NEURONTIN) 300 MG capsule Take 1 capsule (300 mg total) by mouth 2 (two) times daily. (NEEDS TO  BE SEEN BEFORE NEXT REFILL)   glucose blood (ACCU-CHEK GUIDE) test strip Check BS up to 4 times daily Dx e11.9   hydrocortisone (ANUSOL-HC) 2.5 % rectal cream Place 1 application rectally 2 (two) times daily.   loratadine (CLARITIN) 10 MG tablet TAKE ONE (1) TABLET EACH DAY   meloxicam (MOBIC) 15 MG tablet TAKE 1 TABLET BY MOUTH EVERY DAY WITH FOOD   metFORMIN (GLUCOPHAGE) 1000 MG tablet TAKE 1 TABLET BY MOUTH 2 TIMES DAILY WITH A MEAL. (NEEDS TO BE SEEN BEFORE NEXT REFILL)   Multiple Vitamin (MULTIVITAMIN WITH MINERALS) TABS Take 1 tablet by mouth daily.   olmesartan-hydrochlorothiazide (BENICAR HCT) 40-25 MG tablet TAKE 1 TABLET BY MOUTH EACH DAY.  Needs to be seen for further refills.   olopatadine (PATANOL)  0.1 % ophthalmic solution PLACE 1 DROP IN EACH EYE TWICE DAILY   ONETOUCH DELICA LANCETS FINE MISC TEST UP TO 4 TIMES DIALY   sertraline (ZOLOFT) 100 MG tablet 2 po qd   simvastatin (ZOCOR) 40 MG tablet TAKE 1 TABLET BY MOUTH EVERYDAY AT BEDTIME   tadalafil (CIALIS) 5 MG tablet TAKE 1 TABLET BY MOUTH DAILY AS NEEDED FOR ERECTILE DYSFUNCTION   zolpidem (AMBIEN) 10 MG tablet Take 1 tablet (10 mg total) by mouth at bedtime as needed.   No facility-administered encounter medications on file as of 07/09/2021.    Past Surgical History:  Procedure Laterality Date   BACK SURGERY  [id number]   lumb fusion   COLONOSCOPY     KNEE ARTHROSCOPY WITH MEDIAL MENISECTOMY Right 09/26/2013   Procedure: RIGHT KNEE ARTHROSCOPY WITH PARTIAL MEDIAL and lateral chrondroplasty MENISECTOMY;  Surgeon: Lorn Junes, MD;  Location: [place];  Service: Orthopedics;  Laterality: Right;   left elbow surgery     LITHOTRIPSY     NASAL SEPTUM SURGERY     removal of kidney stones  1981   lt open removal   SHOULDER ARTHROSCOPY WITH ROTATOR CUFF REPAIR AND SUBACROMIAL DECOMPRESSION Left 09/26/2013   Procedure: LEFT SHOULDER ARTHROSCOPY WITH DEBRIDEMENT, PARTIAL ROTATOR CUFF REPAIR AND SUBACROMIAL DECOMPRESSION AND SAD ACD DISTAL CLAVICULECTOMY;  Surgeon: Lorn Junes, MD;  Location: [place];  Service: Orthopedics;  Laterality: Left;   TONSILLECTOMY      Family History  Problem Relation Age of Onset   COPD Mother    Hypertension Father    Alzheimer's disease Father    Diabetes Maternal Grandmother    Kidney disease Neg Hx    Prostate cancer Neg Hx     New complaints: None today  Social history: Lives by hisself  Controlled substance contract: 07/09/21     Review of Systems  Constitutional:  Negative for diaphoresis.  Eyes:  Negative for pain.  Respiratory:  Negative for shortness of breath.   Cardiovascular:  Negative for chest pain, palpitations and leg swelling.   Gastrointestinal:  Negative for abdominal pain.  Endocrine: Negative for polydipsia.  Skin:  Negative for rash.  Neurological:  Negative for dizziness, weakness and headaches.  Hematological:  Does not bruise/bleed easily.  All other systems reviewed and are negative.     Objective:   Physical Exam Vitals and nursing note reviewed.  Constitutional:      Appearance: Normal appearance. He is well-developed.  HENT:     Head: Normocephalic.     Nose: Nose normal.  Eyes:     Pupils: Pupils are equal, round, and reactive to light.  Neck:     Thyroid: No thyroid mass or  thyromegaly.     Vascular: No carotid bruit or JVD.     Trachea: Phonation normal.  Cardiovascular:     Rate and Rhythm: Normal rate and regular rhythm.  Pulmonary:     Effort: Pulmonary effort is normal. No respiratory distress.     Breath sounds: Normal breath sounds.  Abdominal:     General: Bowel sounds are normal.     Palpations: Abdomen is soft.     Tenderness: There is no abdominal tenderness.  Musculoskeletal:        General: Normal range of motion.     Cervical back: Normal range of motion and neck supple.  Lymphadenopathy:     Cervical: No cervical adenopathy.  Skin:    General: Skin is warm and dry.  Neurological:     Mental Status: He is alert and oriented to person, place, and time.  Psychiatric:        Behavior: Behavior normal.        Thought Content: Thought content normal.        Judgment: Judgment normal.    HGBA1c 10.5   BP 95/69   Pulse 73   Temp 98.3 F (36.8 C) (Temporal)   Resp 20   Ht '5\' 10"'  (1.778 m)   Wt 215 lb (97.5 kg)   SpO2 99%   BMI 30.85 kg/m      Assessment & Plan:  AVYUKTH BONTEMPO comes in today with chief complaint of Medical Management of Chronic Issues   Diagnosis and orders addressed:  1. Type 2 diabetes mellitus with diabetic polyneuropathy, without long-term current use of insulin (HCC) Strated on ozmepic 0.57m weekly- first dose given in office -  Bayer DCA Hb A1c Waived - Microalbumin / creatinine urine ratio - dapagliflozin propanediol (FARXIGA) 10 MG TABS tablet; Take 1 tablet (10 mg total) by mouth daily. TAKE 1 TABLET (10 MG TOTAL) BY MOUTH DAILY BEFORE BREAKFAST.  Dispense: 90 tablet; Refill: 1 - metFORMIN (GLUCOPHAGE) 1000 MG tablet; TAKE 1 TABLET BY MOUTH 2 TIMES DAILY WITH A MEAL. (NEEDS TO BE SEEN BEFORE NEXT REFILL)  Dispense: 180 tablet; Refill: 1 - Semaglutide,0.25 or 0.5MG/DOS, (OZEMPIC, 0.25 OR 0.5 MG/DOSE,) 2 MG/1.5ML SOPN; 0.0283mweekly for 2 weeks then 0.45m20meekly  Dispense: 3 mL; Refill: 2  2. Hyperlipidemia with target LDL less than 100 Low fat diet - Lipid panel - esomeprazole (NEXIUM) 40 MG capsule; Take 1 capsule (40 mg total) by mouth daily at 12 noon.  Dispense: 90 capsule; Refill: 1 - ezetimibe (ZETIA) 10 MG tablet; Take 1 tablet (10 mg total) by mouth daily.  Dispense: 90 tablet; Refill: 1 - simvastatin (ZOCOR) 40 MG tablet; TAKE 1 TABLET BY MOUTH EVERYDAY AT BEDTIME  Dispense: 90 tablet; Refill: 1  3. Primary hypertension Low sodium diet - CBC with Differential/Platelet - CMP14+EGFR - olmesartan-hydrochlorothiazide (BENICAR HCT) 40-25 MG tablet; TAKE 1 TABLET BY MOUTH EACH DAY.  Needs to be seen for further refills.  Dispense: 90 tablet; Refill: 1  4. Gastroesophageal reflux disease without esophagitis Avoid spicy foods Do not eat 2 hours prior to bedtime - esomeprazole (NEXIUM) 40 MG capsule; Take 1 capsule (40 mg total) by mouth daily at 12 noon.  Dispense: 90 capsule; Refill: 1 - sertraline (ZOLOFT) 100 MG tablet; 2 po qd  Dispense: 180 tablet; Refill: 1  5. Neuropathy Do not go barefooted  6. BPH with obstruction/lower urinary tract symptoms Follow up with urology if having problems voiding  7. Recurrent major depressive disorder, in full  remission (HCC) Stress management - sertraline (ZOLOFT) 100 MG tablet; 2 po qd  Dispense: 180 tablet; Refill: 1  8. Primary insomnia Bedtime routine -  zolpidem (AMBIEN) 10 MG tablet; Take 1 tablet (10 mg total) by mouth at bedtime as needed.  Dispense: 90 tablet; Refill: 1  9. BMI 30.0-30.9,adult Discussed diet and exercise for person with BMI >25 Will recheck weight in 3-6 months  10. Chronic bilateral low back pain with bilateral sciatica - gabapentin (NEURONTIN) 300 MG capsule; Take 1 capsule (300 mg total) by mouth 2 (two) times daily. (NEEDS TO BE SEEN BEFORE NEXT REFILL)  Dispense: 60 capsule; Refill: 0 - diazepam (VALIUM) 5 MG tablet; Take 1-2 tablets (5-10 mg total) by mouth every 6 (six) hours as needed.  Dispense: 180 tablet; Refill: 1   Labs pending Health Maintenance reviewed Diet and exercise encouraged  Follow up plan: 1 months   [name], FNP

## 2021-07-09 NOTE — Patient Instructions (Signed)
https://www.diabeteseducator.org/docs/default-source/living-with-diabetes/conquering-the-grocery-store-v1.pdf?sfvrsn=4">  Carbohydrate Counting for Diabetes Mellitus, Adult Carbohydrate counting is a method of keeping track of how many carbohydrates you eat. Eating carbohydrates naturally increases the amount of sugar (glucose) in the blood. Counting how many carbohydrates you eat improves your bloodglucose control, which helps you manage your diabetes. It is important to know how many carbohydrates you can safely have in each meal. This is different for every person. A dietitian can help you make a meal plan and calculate how many carbohydrates you should have at each meal andsnack. What foods contain carbohydrates? Carbohydrates are found in the following foods: Grains, such as breads and cereals. Dried beans and soy products. Starchy vegetables, such as potatoes, peas, and corn. Fruit and fruit juices. Milk and yogurt. Sweets and snack foods, such as cake, cookies, candy, chips, and soft drinks. How do I count carbohydrates in foods? There are two ways to count carbohydrates in food. You can read food labels or learn standard serving sizes of foods. You can use either of the methods or acombination of both. Using the Nutrition Facts label The Nutrition Facts list is included on the labels of almost all packaged foods and beverages in the [place] It includes: The serving size. Information about nutrients in each serving, including the grams (g) of carbohydrate per serving. To use the Nutrition Facts: Decide how many servings you will have. Multiply the number of servings by the number of carbohydrates per serving. The resulting number is the total amount of carbohydrates that you will be having. Learning the standard serving sizes of foods When you eat carbohydrate foods that are not packaged or do not include Nutrition Facts on the label, you need to measure the servings in order to count the  amount of carbohydrates. Measure the foods that you will eat with a food scale or measuring cup, if needed. Decide how many standard-size servings you will eat. Multiply the number of servings by 15. For foods that contain carbohydrates, one serving equals 15 g of carbohydrates. For example, if you eat 2 cups or 10 oz (300 g) of strawberries, you will have eaten 2 servings and 30 g of carbohydrates (2 servings x 15 g = 30 g). For foods that have more than one food mixed, such as soups and casseroles, you must count the carbohydrates in each food that is included. The following list contains standard serving sizes of common carbohydrate-rich foods. Each of these servings has about 15 g of carbohydrates: 1 slice of bread. 1 six-inch (15 cm) tortilla. ? cup or 2 oz (53 g) cooked rice or pasta.  cup or 3 oz (85 g) cooked or canned, drained and rinsed beans or lentils.  cup or 3 oz (85 g) starchy vegetable, such as peas, corn, or squash.  cup or 4 oz (120 g) hot cereal.  cup or 3 oz (85 g) boiled or mashed potatoes, or  or 3 oz (85 g) of a large baked potato.  cup or 4 fl oz (118 mL) fruit juice. 1 cup or 8 fl oz (237 mL) milk. 1 small or 4 oz (106 g) apple.  or 2 oz (63 g) of a medium banana. 1 cup or 5 oz (150 g) strawberries. 3 cups or 1 oz (24 g) popped popcorn. What is an example of carbohydrate counting? To calculate the number of carbohydrates in this sample meal, follow the stepsshown below. Sample meal 3 oz (85 g) chicken breast. ? cup or 4 oz (106 g) brown rice.    cup or 3 oz (85 g) corn. 1 cup or 8 fl oz (237 mL) milk. 1 cup or 5 oz (150 g) strawberries with sugar-free whipped topping. Carbohydrate calculation Identify the foods that contain carbohydrates: Rice. Corn. Milk. Strawberries. Calculate how many servings you have of each food: 2 servings rice. 1 serving corn. 1 serving milk. 1 serving strawberries. Multiply each number of servings by 15 g: 2 servings  rice x 15 g = 30 g. 1 serving corn x 15 g = 15 g. 1 serving milk x 15 g = 15 g. 1 serving strawberries x 15 g = 15 g. Add together all of the amounts to find the total grams of carbohydrates eaten: 30 g + 15 g + 15 g + 15 g = 75 g of carbohydrates total. What are tips for following this plan? Shopping Develop a meal plan and then make a shopping list. Buy fresh and frozen vegetables, fresh and frozen fruit, dairy, eggs, beans, lentils, and whole grains. Look at food labels. Choose foods that have more fiber and less sugar. Avoid processed foods and foods with added sugars. Meal planning Aim to have the same amount of carbohydrates at each meal and for each snack time. Plan to have regular, balanced meals and snacks. Where to find more information American Diabetes Association: www.diabetes.org Centers for Disease Control and Prevention: www.cdc.gov Summary Carbohydrate counting is a method of keeping track of how many carbohydrates you eat. Eating carbohydrates naturally increases the amount of sugar (glucose) in the blood. Counting how many carbohydrates you eat improves your blood glucose control, which helps you manage your diabetes. A dietitian can help you make a meal plan and calculate how many carbohydrates you should have at each meal and snack. This information is not intended to replace advice given to you by your health care provider. Make sure you discuss any questions you have with your healthcare provider. Document Revised: 11/29/2019 Document Reviewed: 11/30/2019 Elsevier Patient Education  2021 Elsevier Inc.  

## 2021-08-06 ENCOUNTER — Other Ambulatory Visit: Payer: Self-pay

## 2021-08-06 ENCOUNTER — Encounter: Payer: Self-pay | Admitting: Nurse Practitioner

## 2021-08-06 ENCOUNTER — Ambulatory Visit (INDEPENDENT_AMBULATORY_CARE_PROVIDER_SITE_OTHER): Payer: PPO | Admitting: Nurse Practitioner

## 2021-08-06 VITALS — BP 97/71 | HR 75 | Temp 97.6°F | Resp 20 | Ht 70.0 in | Wt 209.0 lb

## 2021-08-06 DIAGNOSIS — E1142 Type 2 diabetes mellitus with diabetic polyneuropathy: Secondary | ICD-10-CM

## 2021-08-06 LAB — BAYER DCA HB A1C WAIVED: HB A1C (BAYER DCA - WAIVED): 8.8 % — ABNORMAL HIGH (ref <7.0)

## 2021-08-06 MED ORDER — BLOOD GLUCOSE METER KIT: PACK | 0 refills | Status: DC

## 2021-08-06 MED ORDER — EMPAGLIFLOZIN 10 MG PO TABS
10.0000 mg | ORAL_TABLET | Freq: Every day | ORAL | 1 refills | Status: DC
Start: 2021-08-06 — End: 2021-11-10

## 2021-08-06 NOTE — Patient Instructions (Signed)
https://www.diabeteseducator.org/docs/default-source/living-with-diabetes/conquering-the-grocery-store-v1.pdf?sfvrsn=4">  Carbohydrate Counting for Diabetes Mellitus, Adult Carbohydrate counting is a method of keeping track of how many carbohydrates you eat. Eating carbohydrates naturally increases the amount of sugar (glucose) in the blood. Counting how many carbohydrates you eat improves your bloodglucose control, which helps you manage your diabetes. It is important to know how many carbohydrates you can safely have in each meal. This is different for every person. A dietitian can help you make a meal plan and calculate how many carbohydrates you should have at each meal andsnack. What foods contain carbohydrates? Carbohydrates are found in the following foods: Grains, such as breads and cereals. Dried beans and soy products. Starchy vegetables, such as potatoes, peas, and corn. Fruit and fruit juices. Milk and yogurt. Sweets and snack foods, such as cake, cookies, candy, chips, and soft drinks. How do I count carbohydrates in foods? There are two ways to count carbohydrates in food. You can read food labels or learn standard serving sizes of foods. You can use either of the methods or acombination of both. Using the Nutrition Facts label The Nutrition Facts list is included on the labels of almost all packaged foods and beverages in the [place] It includes: The serving size. Information about nutrients in each serving, including the grams (g) of carbohydrate per serving. To use the Nutrition Facts: Decide how many servings you will have. Multiply the number of servings by the number of carbohydrates per serving. The resulting number is the total amount of carbohydrates that you will be having. Learning the standard serving sizes of foods When you eat carbohydrate foods that are not packaged or do not include Nutrition Facts on the label, you need to measure the servings in order to count the  amount of carbohydrates. Measure the foods that you will eat with a food scale or measuring cup, if needed. Decide how many standard-size servings you will eat. Multiply the number of servings by 15. For foods that contain carbohydrates, one serving equals 15 g of carbohydrates. For example, if you eat 2 cups or 10 oz (300 g) of strawberries, you will have eaten 2 servings and 30 g of carbohydrates (2 servings x 15 g = 30 g). For foods that have more than one food mixed, such as soups and casseroles, you must count the carbohydrates in each food that is included. The following list contains standard serving sizes of common carbohydrate-rich foods. Each of these servings has about 15 g of carbohydrates: 1 slice of bread. 1 six-inch (15 cm) tortilla. ? cup or 2 oz (53 g) cooked rice or pasta.  cup or 3 oz (85 g) cooked or canned, drained and rinsed beans or lentils.  cup or 3 oz (85 g) starchy vegetable, such as peas, corn, or squash.  cup or 4 oz (120 g) hot cereal.  cup or 3 oz (85 g) boiled or mashed potatoes, or  or 3 oz (85 g) of a large baked potato.  cup or 4 fl oz (118 mL) fruit juice. 1 cup or 8 fl oz (237 mL) milk. 1 small or 4 oz (106 g) apple.  or 2 oz (63 g) of a medium banana. 1 cup or 5 oz (150 g) strawberries. 3 cups or 1 oz (24 g) popped popcorn. What is an example of carbohydrate counting? To calculate the number of carbohydrates in this sample meal, follow the stepsshown below. Sample meal 3 oz (85 g) chicken breast. ? cup or 4 oz (106 g) brown rice.    cup or 3 oz (85 g) corn. 1 cup or 8 fl oz (237 mL) milk. 1 cup or 5 oz (150 g) strawberries with sugar-free whipped topping. Carbohydrate calculation Identify the foods that contain carbohydrates: Rice. Corn. Milk. Strawberries. Calculate how many servings you have of each food: 2 servings rice. 1 serving corn. 1 serving milk. 1 serving strawberries. Multiply each number of servings by 15 g: 2 servings  rice x 15 g = 30 g. 1 serving corn x 15 g = 15 g. 1 serving milk x 15 g = 15 g. 1 serving strawberries x 15 g = 15 g. Add together all of the amounts to find the total grams of carbohydrates eaten: 30 g + 15 g + 15 g + 15 g = 75 g of carbohydrates total. What are tips for following this plan? Shopping Develop a meal plan and then make a shopping list. Buy fresh and frozen vegetables, fresh and frozen fruit, dairy, eggs, beans, lentils, and whole grains. Look at food labels. Choose foods that have more fiber and less sugar. Avoid processed foods and foods with added sugars. Meal planning Aim to have the same amount of carbohydrates at each meal and for each snack time. Plan to have regular, balanced meals and snacks. Where to find more information American Diabetes Association: www.diabetes.org Centers for Disease Control and Prevention: www.cdc.gov Summary Carbohydrate counting is a method of keeping track of how many carbohydrates you eat. Eating carbohydrates naturally increases the amount of sugar (glucose) in the blood. Counting how many carbohydrates you eat improves your blood glucose control, which helps you manage your diabetes. A dietitian can help you make a meal plan and calculate how many carbohydrates you should have at each meal and snack. This information is not intended to replace advice given to you by your health care provider. Make sure you discuss any questions you have with your healthcare provider. Document Revised: 11/29/2019 Document Reviewed: 11/30/2019 Elsevier Patient Education  2021 Elsevier Inc.  

## 2021-08-06 NOTE — Progress Notes (Signed)
   Subjective:    Patient ID: [name], male    DOB: 1961-10-31, 60 y.o.   MRN: [id number]   Chief Complaint: Diabetes   HPI Patient was seen on 07/09/21. He had not been watching diet and his hgba1c was  10.5. We started her on ozempic 0.'25mg'$  daily. His blood sugars have improved to 130-140. No low blood sugars. Weight is down 6lbs.  Wt Readings from Last 3 Encounters:  08/06/21 209 lb (94.8 kg)  07/09/21 215 lb (97.5 kg)  06/17/21 216 lb (98 kg)         Review of Systems  Constitutional:  Negative for diaphoresis.  Eyes:  Negative for pain.  Respiratory:  Negative for shortness of breath.   Cardiovascular:  Negative for chest pain, palpitations and leg swelling.  Gastrointestinal:  Negative for abdominal pain.  Endocrine: Negative for polydipsia.  Skin:  Negative for rash.  Neurological:  Negative for dizziness, weakness and headaches.  Hematological:  Does not bruise/bleed easily.  All other systems reviewed and are negative.     Objective:   Physical Exam Vitals and nursing note reviewed.  Constitutional:      Appearance: Normal appearance.  Cardiovascular:     Rate and Rhythm: Normal rate and regular rhythm.     Pulses: Normal pulses.     Heart sounds: Normal heart sounds.  Pulmonary:     Effort: Pulmonary effort is normal.     Breath sounds: Normal breath sounds.  Musculoskeletal:        General: Normal range of motion.  Skin:    General: Skin is warm.  Neurological:     General: No focal deficit present.     Mental Status: He is alert and oriented to person, place, and time.   BP 97/71   Pulse 75   Temp 97.6 F (36.4 C) (Temporal)   Resp 20   Ht '5\' 10"'$  (1.778 m)   Wt 209 lb (94.8 kg)   SpO2 97%   BMI 29.99 kg/m   HGBA1c- 8.8       Assessment & Plan:  [name] in today with chief complaint of Diabetes   1. Type 2 diabetes mellitus with diabetic polyneuropathy, without long-term current use of insulin ([place]) Changed farxiga  jardiance today due to expense Continue to watch carbs in diet Keep diary of blood sugars - Bayer DCA Hb A1c Waived - empagliflozin (JARDIANCE) 10 MG TABS tablet; Take 1 tablet (10 mg total) by mouth daily before breakfast.  Dispense: 90 tablet; Refill: 1    The above assessment and management plan was discussed with the patient. The patient verbalized understanding of and has agreed to the management plan. Patient is aware to call the clinic if symptoms persist or worsen. Patient is aware when to return to the clinic for a follow-up visit. Patient educated on when it is appropriate to go to the emergency department.   [name], FNP

## 2021-08-19 ENCOUNTER — Other Ambulatory Visit: Payer: Self-pay | Admitting: Nurse Practitioner

## 2021-08-19 ENCOUNTER — Telehealth: Payer: Self-pay | Admitting: Nurse Practitioner

## 2021-08-19 DIAGNOSIS — E1142 Type 2 diabetes mellitus with diabetic polyneuropathy: Secondary | ICD-10-CM

## 2021-08-19 MED ORDER — FREESTYLE LIBRE 2 READER DEVI: 1.0000 | 3 refills | Status: AC

## 2021-08-19 MED ORDER — FREESTYLE LIBRE 2 SENSOR MISC: 0 refills | Status: DC

## 2021-08-19 NOTE — Telephone Encounter (Signed)
Will writeprescription, but insurance may not pay for it. We will try and see.

## 2021-08-19 NOTE — Telephone Encounter (Signed)
Please take care of this.  

## 2021-08-19 NOTE — Telephone Encounter (Signed)
Referral done to CCM and patient notified

## 2021-08-19 NOTE — Telephone Encounter (Signed)
Patient needs referral to see Almyra Free for CCM patient assistance program.  Please call patient to schedule appt.

## 2021-08-19 NOTE — Telephone Encounter (Signed)
Patient aware and verbalized understanding. °

## 2021-08-19 NOTE — Progress Notes (Signed)
     Re 

## 2021-08-19 NOTE — Telephone Encounter (Signed)
Pt called stating that MMM wrote him a prescription to get a BS meter that pricks his finger to check his sugar but pt says he doesn't want that. He wants a libre to wear on his arm. Wants to know if MMM can write new prescription and he will come to pick up.  Please advise and call patient.

## 2021-08-20 ENCOUNTER — Telehealth: Payer: Self-pay

## 2021-08-20 NOTE — Chronic Care Management (AMB) (Signed)
  Chronic Care Management   Outreach Note  08/20/2021 Name: JEET HEARING MRN: [id number] DOB: 03/15/1961  SALIOU MCILVAIN is a 60 y.o. year old male who is a primary care patient of Chevis Pretty, [place]. I reached out to [name] by phone today in response to a referral sent by Mr. Donte Glickstein Robey's PCP, Chevis Pretty, FNP      An unsuccessful telephone outreach was attempted today. The patient was referred to the case management team for assistance with care management and care coordination.   Follow Up Plan: A HIPAA compliant phone message was left for the patient providing contact information and requesting a return call.  The care management team will reach out to the patient again over the next 7 days.  If patient returns call to provider office, please advise to call [facility]  at [place], [place], [place], [place] [address] Direct Dial: [phone] [email or web address] Website: [email or web address]

## 2021-08-28 NOTE — Chronic Care Management (AMB) (Signed)
  Chronic Care Management   Outreach Note  08/28/2021 Name: [name] MRN: [id number] DOB: [birth year]  [name] is a 60 y.o. year old male who is a primary care patient of Chevis Pretty, [place]. I reached out to [name] by phone today in response to a referral sent by Mr. Amrit Nevells Fanelli's PCP, Chevis Pretty, FNP      A second unsuccessful telephone outreach was attempted today. The patient was referred to the case management team for assistance with care management and care coordination.   Follow Up Plan: A HIPAA compliant phone message was left for the patient providing contact information and requesting a return call.  The care management team will reach out to the patient again over the next 7 days.  If patient returns call to provider office, please advise to call [facility]  at [place], [place], [place], [place] [address] Direct Dial: [phone] [email or web address] Website: [email or web address]

## 2021-09-03 ENCOUNTER — Telehealth: Payer: Self-pay | Admitting: Nurse Practitioner

## 2021-09-03 DIAGNOSIS — M5442 Lumbago with sciatica, left side: Secondary | ICD-10-CM

## 2021-09-03 DIAGNOSIS — G8929 Other chronic pain: Secondary | ICD-10-CM

## 2021-09-03 MED ORDER — GABAPENTIN 300 MG PO CAPS: 300.0000 mg | ORAL_CAPSULE | Freq: Two times a day (BID) | ORAL | 0 refills | Status: DC

## 2021-09-03 NOTE — Telephone Encounter (Signed)
  Prescription Request  09/03/2021  Is this a "Controlled Substance" medicine? no Have you seen your PCP in the last 2 weeks? no If YES, route message to pool  -  If NO, patient needs to be scheduled for appointment.  What is the name of the medication or equipment? Gabapentin Have you contacted your pharmacy to request a refill?  yes Which pharmacy would you like this sent to? [facility]   Patient notified that their request is being sent to the clinical staff for review and that they should receive a response within 2 business days.    MMM's pt.  Please call pt.  He is going out of town.  He has an appt in Nov.

## 2021-09-03 NOTE — Telephone Encounter (Signed)
Pt aware refill sent to pharmacy 

## 2021-09-03 NOTE — Chronic Care Management (AMB) (Signed)
  Chronic Care Management   Note  09/03/2021 Name: [name] MRN: [id number] DOB: 04/16/1961  [name] is a 60 y.o. year old male who is a primary care patient of Chevis Pretty, [place]. I reached out to [name] by phone today in response to a referral sent by [name]'s PCP.  [name] was given information about Chronic Care Management services today including:  CCM service includes personalized support from designated clinical staff supervised by his physician, including individualized plan of care and coordination with other care providers 24/7 contact phone numbers for assistance for urgent and routine care needs. Service will only be billed when office clinical staff spend 20 minutes or more in a month to coordinate care. Only one practitioner may furnish and bill the service in a calendar month. The patient may stop CCM services at any time (effective at the end of the month) by phone call to the office staff. The patient is responsible for co-pay (up to 20% after annual deductible is met) if co-pay is required by the individual health plan.   Patient agreed to services and verbal consent obtained.   Follow up plan: Telephone appointment with care management team member scheduled for:10/07/2021  Noreene Larsson, [place], [place], [place] [address] Direct Dial: [phone] [email or web address] Website: [email or web address]

## 2021-09-04 ENCOUNTER — Other Ambulatory Visit: Payer: Self-pay | Admitting: Nurse Practitioner

## 2021-09-04 DIAGNOSIS — E1142 Type 2 diabetes mellitus with diabetic polyneuropathy: Secondary | ICD-10-CM

## 2021-10-07 ENCOUNTER — Telehealth: Payer: PPO

## 2021-10-07 ENCOUNTER — Telehealth: Payer: Self-pay | Admitting: Pharmacist

## 2021-10-07 NOTE — Telephone Encounter (Signed)
  Care Management   Follow Up Note   10/07/2021 Name: [name] MRN: [id number] DOB: January 26, 1961   Referred by: Chevis Pretty, FNP Reason for referral : Medication Management (UNSUCCESSFUL OUTREACH #1)   An unsuccessful telephone outreach was attempted today. The patient was referred to the case management team for assistance with care management and care coordination.   Follow Up Plan: The care management team will reach out to the patient again over the next 7 days.      Regina Eck, PharmD, BCPS Clinical Pharmacist, [place]  II Phone [phone]

## 2021-10-15 ENCOUNTER — Telehealth: Payer: Self-pay

## 2021-10-15 NOTE — Chronic Care Management (AMB) (Signed)
  Care Management   Note  10/15/2021 Name: BIRAN MAYBERRY MRN: [id number] DOB: Jan 17, 1961  DEVONTRE SIEDSCHLAG is a 60 y.o. year old male who is a primary care patient of Chevis Pretty, [place] and is actively engaged with the care management team. I reached out to [name] by phone today to assist with re-scheduling an initial visit with the Pharmacist  Follow up plan: Unsuccessful telephone outreach attempt made. A HIPAA compliant phone message was left for the patient providing contact information and requesting a return call.  The care management team will reach out to the patient again over the next 7 days.  If patient returns call to provider office, please advise to call [facility]  at [place], [place], [place], [place] [address] Direct Dial: [phone] [email or web address] Website: [email or web address]

## 2021-11-02 NOTE — Chronic Care Management (AMB) (Signed)
  Care Management   Note  11/02/2021 Name: [name] MRN: [id number] DOB: [birth year]  [name] is a 60 y.o. year old male who is a primary care patient of Chevis Pretty, [place] and is actively engaged with the care management team. I reached out to [name] by phone today to assist with re-scheduling an initial visit with the Pharmacist  Follow up plan: Unsuccessful telephone outreach attempt made. A HIPAA compliant phone message was left for the patient providing contact information and requesting a return call.  The care management team will reach out to the patient again over the next 7 days.  If patient returns call to provider office, please advise to call [facility]  at [place], [place], [place], [place] [address] Direct Dial: [phone] [email or web address] Website: [email or web address]

## 2021-11-09 ENCOUNTER — Encounter: Payer: Self-pay | Admitting: Nurse Practitioner

## 2021-11-09 ENCOUNTER — Ambulatory Visit: Payer: PPO | Admitting: Nurse Practitioner

## 2021-11-10 ENCOUNTER — Other Ambulatory Visit: Payer: Self-pay

## 2021-11-10 ENCOUNTER — Encounter: Payer: Self-pay | Admitting: Nurse Practitioner

## 2021-11-10 ENCOUNTER — Other Ambulatory Visit: Payer: Self-pay | Admitting: Nurse Practitioner

## 2021-11-10 ENCOUNTER — Ambulatory Visit (INDEPENDENT_AMBULATORY_CARE_PROVIDER_SITE_OTHER): Payer: PPO | Admitting: Nurse Practitioner

## 2021-11-10 VITALS — BP 103/73 | HR 93 | Temp 97.6°F | Resp 20 | Ht 70.0 in | Wt 205.0 lb

## 2021-11-10 DIAGNOSIS — F5101 Primary insomnia: Secondary | ICD-10-CM

## 2021-11-10 DIAGNOSIS — N401 Enlarged prostate with lower urinary tract symptoms: Secondary | ICD-10-CM

## 2021-11-10 DIAGNOSIS — N529 Male erectile dysfunction, unspecified: Secondary | ICD-10-CM | POA: Diagnosis not present

## 2021-11-10 DIAGNOSIS — N138 Other obstructive and reflux uropathy: Secondary | ICD-10-CM

## 2021-11-10 DIAGNOSIS — E1142 Type 2 diabetes mellitus with diabetic polyneuropathy: Secondary | ICD-10-CM | POA: Diagnosis not present

## 2021-11-10 DIAGNOSIS — I10 Essential (primary) hypertension: Secondary | ICD-10-CM | POA: Diagnosis not present

## 2021-11-10 DIAGNOSIS — Z1211 Encounter for screening for malignant neoplasm of colon: Secondary | ICD-10-CM

## 2021-11-10 DIAGNOSIS — E785 Hyperlipidemia, unspecified: Secondary | ICD-10-CM

## 2021-11-10 DIAGNOSIS — M5442 Lumbago with sciatica, left side: Secondary | ICD-10-CM | POA: Diagnosis not present

## 2021-11-10 DIAGNOSIS — M5441 Lumbago with sciatica, right side: Secondary | ICD-10-CM

## 2021-11-10 DIAGNOSIS — Z23 Encounter for immunization: Secondary | ICD-10-CM | POA: Diagnosis not present

## 2021-11-10 DIAGNOSIS — F3342 Major depressive disorder, recurrent, in full remission: Secondary | ICD-10-CM

## 2021-11-10 DIAGNOSIS — K219 Gastro-esophageal reflux disease without esophagitis: Secondary | ICD-10-CM

## 2021-11-10 DIAGNOSIS — G629 Polyneuropathy, unspecified: Secondary | ICD-10-CM

## 2021-11-10 DIAGNOSIS — Z125 Encounter for screening for malignant neoplasm of prostate: Secondary | ICD-10-CM | POA: Diagnosis not present

## 2021-11-10 DIAGNOSIS — R3911 Hesitancy of micturition: Secondary | ICD-10-CM

## 2021-11-10 DIAGNOSIS — G8929 Other chronic pain: Secondary | ICD-10-CM

## 2021-11-10 LAB — BAYER DCA HB A1C WAIVED: HB A1C (BAYER DCA - WAIVED): 5.8 % — ABNORMAL HIGH (ref 4.8–5.6)

## 2021-11-10 MED ORDER — GABAPENTIN 300 MG PO CAPS: 300.0000 mg | ORAL_CAPSULE | Freq: Two times a day (BID) | ORAL | 0 refills | Status: DC

## 2021-11-10 MED ORDER — EZETIMIBE 10 MG PO TABS: 10.0000 mg | ORAL_TABLET | Freq: Every day | ORAL | 1 refills | Status: DC

## 2021-11-10 MED ORDER — SERTRALINE HCL 100 MG PO TABS: ORAL_TABLET | ORAL | 1 refills | Status: DC

## 2021-11-10 MED ORDER — ZOLPIDEM TARTRATE 10 MG PO TABS: 10.0000 mg | ORAL_TABLET | Freq: Every evening | ORAL | 1 refills | Status: DC | PRN

## 2021-11-10 MED ORDER — OLMESARTAN MEDOXOMIL-HCTZ 40-25 MG PO TABS: ORAL_TABLET | ORAL | 1 refills | Status: DC

## 2021-11-10 MED ORDER — ESOMEPRAZOLE MAGNESIUM 40 MG PO CPDR: 40.0000 mg | DELAYED_RELEASE_CAPSULE | Freq: Every day | ORAL | 1 refills | Status: DC

## 2021-11-10 MED ORDER — SIMVASTATIN 40 MG PO TABS: ORAL_TABLET | ORAL | 1 refills | Status: DC

## 2021-11-10 MED ORDER — METFORMIN HCL 1000 MG PO TABS: ORAL_TABLET | ORAL | 1 refills | Status: DC

## 2021-11-10 MED ORDER — DIAZEPAM 5 MG PO TABS: 5.0000 mg | ORAL_TABLET | Freq: Four times a day (QID) | ORAL | 1 refills | Status: DC | PRN

## 2021-11-10 MED ORDER — TADALAFIL 5 MG PO TABS: ORAL_TABLET | ORAL | 1 refills | Status: DC

## 2021-11-10 NOTE — Progress Notes (Signed)
Subjective:    Patient ID: [name], male    DOB: 11/06/1961, 60 y.o.   MRN: [id number]   Chief Complaint: medical management of chronic issues     HPI:  1. Primary hypertension No c/o chest pain, sob or headache. Does not check blood pressure at home. BP Readings from Last 3 Encounters:  08/06/21 97/71  07/09/21 95/69  01/22/21 118/82     2. Hyperlipidemia with target LDL less than 100 Does not watch diet and does no dedicated exercise. Lab Results  Component Value Date   CHOL 120 07/09/2021   HDL 40 07/09/2021   LDLCALC 53 07/09/2021   TRIG 157 (H) 07/09/2021   CHOLHDL 3.0 07/09/2021     3. Gastroesophageal reflux disease without esophagitis Is on nexium daily and that works well for him.  4. Type 2 diabetes mellitus with diabetic polyneuropathy, without long-term current use of insulin (HCC) Fasting blood sugars are running around 90-120. He has not had on low blood sugars. Lab Results  Component Value Date   HGBA1C 8.8 (H) 08/06/2021    5. Neuropathy The gabapentin helps. He has no burning anymore, which was worse at night  6. BPH with obstruction/lower urinary tract symptoms Denies any problems voiding  7. Recurrent major depressive disorder, in full remission ([place]) Has been on zoloft for several years. Says that it is working well for him. Depression screen [facility] 2/9 11/10/2021 08/06/2021 07/09/2021  Decreased Interest 2 0 1  Down, Depressed, Hopeless '2 1 1  ' PHQ - 2 Score '4 1 2  ' Altered sleeping 2 0 0  Tired, decreased energy '2 1 1  ' Change in appetite 0 2 2  Feeling bad or failure about yourself  0 0 0  Trouble concentrating 2 0 0  Moving slowly or fidgety/restless 0 0 0  Suicidal thoughts 0 0 0  PHQ-9 Score '10 4 5  ' Difficult doing work/chores Not difficult at all Not difficult at all Not difficult at all  Some recent data might be hidden     8. Primary insomnia Takes ambien to sleep.    Outpatient Encounter Medications as of 11/10/2021   Medication Sig   blood glucose meter kit and supplies Dispense based on patient and insurance preference. Use up to four times daily as directed. (FOR ICD-10 E10.9, E11.9).   Continuous Blood Gluc Receiver (FREESTYLE LIBRE 2 READER) DEVI 1 each by Does not apply route every 14 (fourteen) days.   Continuous Blood Gluc Sensor (FREESTYLE LIBRE 2 SENSOR) MISC USE TO CHECK SUGAR  Dx: E11.9   cyclobenzaprine (FLEXERIL) 10 MG tablet Take 10 mg by mouth 3 (three) times daily.   dapagliflozin propanediol (FARXIGA) 10 MG TABS tablet Take 1 tablet (10 mg total) by mouth daily. TAKE 1 TABLET (10 MG TOTAL) BY MOUTH DAILY BEFORE BREAKFAST.   diazepam (VALIUM) 5 MG tablet Take 1-2 tablets (5-10 mg total) by mouth every 6 (six) hours as needed.   empagliflozin (JARDIANCE) 10 MG TABS tablet Take 1 tablet (10 mg total) by mouth daily before breakfast.   esomeprazole (NEXIUM) 40 MG capsule Take 1 capsule (40 mg total) by mouth daily at 12 noon.   ezetimibe (ZETIA) 10 MG tablet Take 1 tablet (10 mg total) by mouth daily.   fluticasone (FLONASE) 50 MCG/ACT nasal spray USE 1 SPRAY IN EACH NOSTRIL ONCE DAILY   gabapentin (NEURONTIN) 300 MG capsule Take 1 capsule (300 mg total) by mouth 2 (two) times daily.   glucose blood (ACCU-CHEK GUIDE)  test strip Check BS up to 4 times daily Dx e11.9   hydrocortisone (ANUSOL-HC) 2.5 % rectal cream Place 1 application rectally 2 (two) times daily.   loratadine (CLARITIN) 10 MG tablet TAKE ONE (1) TABLET EACH DAY   meloxicam (MOBIC) 15 MG tablet TAKE 1 TABLET BY MOUTH EVERY DAY WITH FOOD   metFORMIN (GLUCOPHAGE) 1000 MG tablet TAKE 1 TABLET BY MOUTH 2 TIMES DAILY WITH A MEAL. (NEEDS TO BE SEEN BEFORE NEXT REFILL)   Multiple Vitamin (MULTIVITAMIN WITH MINERALS) TABS Take 1 tablet by mouth daily.   olmesartan-hydrochlorothiazide (BENICAR HCT) 40-25 MG tablet TAKE 1 TABLET BY MOUTH EACH DAY.  Needs to be seen for further refills.   olopatadine (PATANOL) 0.1 % ophthalmic solution PLACE  1 DROP IN EACH EYE TWICE DAILY   ONETOUCH DELICA LANCETS FINE MISC TEST UP TO 4 TIMES DIALY   Semaglutide,0.25 or 0.5MG/DOS, (OZEMPIC, 0.25 OR 0.5 MG/DOSE,) 2 MG/1.5ML SOPN 0.020m weekly for 2 weeks then 0.546mweekly   sertraline (ZOLOFT) 100 MG tablet 2 po qd   simvastatin (ZOCOR) 40 MG tablet TAKE 1 TABLET BY MOUTH EVERYDAY AT BEDTIME   tadalafil (CIALIS) 5 MG tablet TAKE 1 TABLET BY MOUTH DAILY AS NEEDED FOR ERECTILE DYSFUNCTION   zolpidem (AMBIEN) 10 MG tablet Take 1 tablet (10 mg total) by mouth at bedtime as needed.   No facility-administered encounter medications on file as of 11/10/2021.    Past Surgical History:  Procedure Laterality Date   BACK SURGERY  [id number] lumb fusion   COLONOSCOPY     KNEE ARTHROSCOPY WITH MEDIAL MENISECTOMY Right 09/26/2013   Procedure: RIGHT KNEE ARTHROSCOPY WITH PARTIAL MEDIAL and lateral chrondroplasty MENISECTOMY;  Surgeon: RoLorn JunesMD;  [place];  Laterality: Right;   left elbow surgery     LITHOTRIPSY     NASAL SEPTUM SURGERY     removal of kidney stones  1981   lt open removal   SHOULDER ARTHROSCOPY WITH ROTATOR CUFF REPAIR AND SUBACROMIAL DECOMPRESSION Left 09/26/2013   Procedure: LEFT SHOULDER ARTHROSCOPY WITH DEBRIDEMENT, PARTIAL ROTATOR CUFF REPAIR AND SUBACROMIAL DECOMPRESSION AND SAD ACD DISTAL CLAVICULECTOMY;  Surgeon: RoLorn JunesMD;  [place] [facility];  Laterality: Left;   TONSILLECTOMY      Family History  Problem Relation Age of Onset   COPD Mother    Hypertension Father    Alzheimer's disease Father    Diabetes Maternal Grandmother    Kidney disease Neg Hx    Prostate cancer Neg Hx     New complaints: None today  Social history: Lives by hisself  Controlled substance contract: n/a     Review of Systems  Constitutional:  Negative for diaphoresis.  Eyes:  Negative for pain.  Respiratory:  Negative for  shortness of breath.   Cardiovascular:  Negative for chest pain, palpitations and leg swelling.  Gastrointestinal:  Negative for abdominal pain.  Endocrine: Negative for polydipsia.  Skin:  Negative for rash.  Neurological:  Negative for dizziness, weakness and headaches.  Hematological:  Does not bruise/bleed easily.  All other systems reviewed and are negative.     Objective:   Physical Exam Vitals and nursing note reviewed.  Constitutional:      Appearance: Normal appearance. He is well-developed.  HENT:     Head: Normocephalic.     Nose: Nose normal.  Eyes:     Pupils: Pupils are equal, round, and reactive to light.  Neck:     Thyroid: No thyroid mass or thyromegaly.     Vascular: No carotid bruit or JVD.     Trachea: Phonation normal.  Cardiovascular:     Rate and Rhythm: Normal rate and regular rhythm.  Pulmonary:     Effort: Pulmonary effort is normal. No respiratory distress.     Breath sounds: Normal breath sounds.  Abdominal:     General: Bowel sounds are normal.     Palpations: Abdomen is soft.     Tenderness: There is no abdominal tenderness.  Musculoskeletal:        General: Normal range of motion.     Cervical back: Normal range of motion and neck supple.  Lymphadenopathy:     Cervical: No cervical adenopathy.  Skin:    General: Skin is warm and dry.  Neurological:     Mental Status: He is alert and oriented to person, place, and time.  Psychiatric:        Behavior: Behavior normal.        Thought Content: Thought content normal.        Judgment: Judgment normal.    BP 103/73   Pulse 93   Temp 97.6 F (36.4 C) (Temporal)   Resp 20   Ht '5\' 10"'  (1.778 m)   Wt 205 lb (93 kg)   SpO2 98%   BMI 29.41 kg/m   HGBA1C- 5.8%     Assessment & Plan:  TEVITA GOMER comes in today with chief complaint of Medical Management of Chronic Issues   Diagnosis and orders addressed:  1. Primary hypertension Low sodium diet - CBC with  Differential/Platelet - CMP14+EGFR - olmesartan-hydrochlorothiazide (BENICAR HCT) 40-25 MG tablet; TAKE 1 TABLET BY MOUTH EACH DAY.  Needs to be seen for further refills.  Dispense: 90 tablet; Refill: 1  2. Hyperlipidemia with target LDL less than 100 Low fat diet - Lipid panel - esomeprazole (NEXIUM) 40 MG capsule; Take 1 capsule (40 mg total) by mouth daily at 12 noon.  Dispense: 90 capsule; Refill: 1 - ezetimibe (ZETIA) 10 MG tablet; Take 1 tablet (10 mg total) by mouth daily.  Dispense: 90 tablet; Refill: 1 - simvastatin (ZOCOR) 40 MG tablet; TAKE 1 TABLET BY MOUTH EVERYDAY AT BEDTIME  Dispense: 90 tablet; Refill: 1  3. Gastroesophageal reflux disease without esophagitis Avoid spicy foods Do not eat 2 hours prior to bedtime - esomeprazole (NEXIUM) 40 MG capsule; Take 1 capsule (40 mg total) by mouth daily at 12 noon.  Dispense: 90 capsule; Refill: 1 - sertraline (ZOLOFT) 100 MG tablet; 2 po qd  Dispense: 180 tablet; Refill: 1  4. Type 2 diabetes mellitus with diabetic polyneuropathy, without long-term current use of insulin (HCC) Continue  to wathc carbs in diet - Bayer DCA Hb A1c Waived - Microalbumin / creatinine urine ratio - metFORMIN (GLUCOPHAGE) 1000 MG tablet; TAKE 1 TABLET BY MOUTH 2 TIMES DAILY WITH A MEAL. (NEEDS TO BE SEEN BEFORE NEXT REFILL)  Dispense: 180 tablet; Refill: 1  5. Neuropathy Do not go barefooted  6. BPH with obstruction/lower urinary tract symptoms Report any problems voiding - tadalafil (CIALIS) 5 MG tablet; TAKE 1 TABLET BY MOUTH DAILY AS NEEDED FOR ERECTILE DYSFUNCTION  Dispense: 90 tablet; Refill: 1  7. Recurrent major depressive disorder, in full remission ([place]) Stress management - sertraline (ZOLOFT) 100 MG tablet; 2 po qd  Dispense: 180 tablet; Refill: 1  8. Primary insomnia Bedtime routine - zolpidem (AMBIEN) 10 MG tablet; Take 1 tablet (10  mg total) by mouth at bedtime as needed.  Dispense: 90 tablet; Refill: 1  9. Screening for prostate  cancer - PSA, total and free  10. Erectile dysfunction of organic origin - tadalafil (CIALIS) 5 MG tablet; TAKE 1 TABLET BY MOUTH DAILY AS NEEDED FOR ERECTILE DYSFUNCTION  Dispense: 90 tablet; Refill: 1  11. Benign prostatic hyperplasia with urinary hesitancy - tadalafil (CIALIS) 5 MG tablet; TAKE 1 TABLET BY MOUTH DAILY AS NEEDED FOR ERECTILE DYSFUNCTION  Dispense: 90 tablet; Refill: 1  12. Chronic bilateral low back pain with bilateral sciatica - gabapentin (NEURONTIN) 300 MG capsule; Take 1 capsule (300 mg total) by mouth 2 (two) times daily.  Dispense: 180 capsule; Refill: 0 - diazepam (VALIUM) 5 MG tablet; Take 1-2 tablets (5-10 mg total) by mouth every 6 (six) hours as needed.  Dispense: 180 tablet; Refill: 1   Labs pending Health Maintenance reviewed- cologuard ordered Diet and exercise encouraged  Follow up plan: 3 months   [place], FNP

## 2021-11-10 NOTE — Patient Instructions (Signed)

## 2021-11-10 NOTE — Addendum Note (Signed)
Addended by: Rolena Infante on: 11/10/2021 03:50 PM   Modules accepted: Orders

## 2021-11-11 LAB — PSA, TOTAL AND FREE
PSA, Free Pct: 33.9 %
PSA, Free: 0.61 ng/mL
Prostate Specific Ag, Serum: 1.8 ng/mL (ref 0.0–4.0)

## 2021-11-11 LAB — CBC WITH DIFFERENTIAL/PLATELET
Basophils Absolute: 0.1 10*3/uL (ref 0.0–0.2)
Basos: 1 %
EOS (ABSOLUTE): 0.4 10*3/uL (ref 0.0–0.4)
Eos: 4 %
Hematocrit: 42.3 % (ref 37.5–51.0)
Hemoglobin: 14.1 g/dL (ref 13.0–17.7)
Immature Grans (Abs): 0 10*3/uL (ref 0.0–0.1)
Immature Granulocytes: 1 %
Lymphocytes Absolute: 3.3 10*3/uL — ABNORMAL HIGH (ref 0.7–3.1)
Lymphs: 39 %
MCH: 30.7 pg (ref 26.6–33.0)
MCHC: 33.3 g/dL (ref 31.5–35.7)
MCV: 92 fL (ref 79–97)
Monocytes Absolute: 0.6 10*3/uL (ref 0.1–0.9)
Monocytes: 7 %
Neutrophils Absolute: 4 10*3/uL (ref 1.4–7.0)
Neutrophils: 48 %
Platelets: 195 10*3/uL (ref 150–450)
RBC: 4.59 x10E6/uL (ref 4.14–5.80)
RDW: 12.7 % (ref 11.6–15.4)
WBC: 8.3 10*3/uL (ref 3.4–10.8)

## 2021-11-11 LAB — CMP14+EGFR
ALT: 16 IU/L (ref 0–44)
AST: 20 IU/L (ref 0–40)
Albumin/Globulin Ratio: 2.3 — ABNORMAL HIGH (ref 1.2–2.2)
Albumin: 4.9 g/dL — ABNORMAL HIGH (ref 3.8–4.8)
Alkaline Phosphatase: 50 IU/L (ref 44–121)
BUN/Creatinine Ratio: 20 (ref 10–24)
BUN: 28 mg/dL — ABNORMAL HIGH (ref 8–27)
Bilirubin Total: 0.5 mg/dL (ref 0.0–1.2)
CO2: 20 mmol/L (ref 20–29)
Calcium: 9.5 mg/dL (ref 8.6–10.2)
Chloride: 109 mmol/L — ABNORMAL HIGH (ref 96–106)
Creatinine, Ser: 1.42 mg/dL — ABNORMAL HIGH (ref 0.76–1.27)
Globulin, Total: 2.1 g/dL (ref 1.5–4.5)
Glucose: 118 mg/dL — ABNORMAL HIGH (ref 70–99)
Potassium: 4.9 mmol/L (ref 3.5–5.2)
Sodium: 145 mmol/L — ABNORMAL HIGH (ref 134–144)
Total Protein: 7 g/dL (ref 6.0–8.5)
eGFR: 56 mL/min/{1.73_m2} — ABNORMAL LOW (ref >59)

## 2021-11-11 LAB — MICROALBUMIN / CREATININE URINE RATIO
Creatinine, Urine: 127.2 mg/dL
Microalb/Creat Ratio: <2 mg/g creat (ref 0–29)
Microalbumin, Urine: <3 ug/mL

## 2021-11-11 LAB — LIPID PANEL
Chol/HDL Ratio: 3.4 ratio (ref 0.0–5.0)
Cholesterol, Total: 144 mg/dL (ref 100–199)
HDL: 42 mg/dL (ref >39)
LDL Chol Calc (NIH): 73 mg/dL (ref 0–99)
Triglycerides: 167 mg/dL — ABNORMAL HIGH (ref 0–149)
VLDL Cholesterol Cal: 29 mg/dL (ref 5–40)

## 2021-11-13 NOTE — Telephone Encounter (Signed)
3rd unsuccessful outreach  

## 2021-11-13 NOTE — Chronic Care Management (AMB) (Signed)
  Care Management   Note  11/13/2021 Name: JOSEMARIA BRINING MRN: [id number] DOB: 02/13/1961  MAANAV KASSABIAN is a 60 y.o. year old male who is a primary care patient of Chevis Pretty, [place] and is actively engaged with the care management team. I reached out to [name] by phone today to assist with re-scheduling an initial visit with the Pharmacist  Follow up plan: Unable to make contact on outreach attempts x 3. PCP Chevis Pretty, FNP notified via routed documentation in medical record.   Noreene Larsson, [place], [place], [place] [address] Direct Dial: [phone] [email or web address] Website: [email or web address]

## 2021-12-22 DIAGNOSIS — Z1211 Encounter for screening for malignant neoplasm of colon: Secondary | ICD-10-CM | POA: Diagnosis not present

## 2021-12-24 ENCOUNTER — Other Ambulatory Visit: Payer: Self-pay | Admitting: Nurse Practitioner

## 2021-12-24 DIAGNOSIS — G8929 Other chronic pain: Secondary | ICD-10-CM

## 2021-12-24 DIAGNOSIS — M5441 Lumbago with sciatica, right side: Secondary | ICD-10-CM

## 2021-12-29 ENCOUNTER — Telehealth: Payer: Self-pay | Admitting: Nurse Practitioner

## 2021-12-29 ENCOUNTER — Other Ambulatory Visit: Payer: Self-pay

## 2021-12-29 DIAGNOSIS — E1142 Type 2 diabetes mellitus with diabetic polyneuropathy: Secondary | ICD-10-CM

## 2021-12-29 MED ORDER — OZEMPIC (0.25 OR 0.5 MG/DOSE) 2 MG/1.5ML ~~LOC~~ SOPN: PEN_INJECTOR | SUBCUTANEOUS | 2 refills | Status: DC

## 2021-12-29 NOTE — Telephone Encounter (Signed)
I would see if [facility] has but if not we can give 1 sample.  [facility] usually [facility]  I've just had to give out a lot of samples today and we are almost out--so if insurance covers it, i'm trying to find supply at other pharmacies

## 2021-12-29 NOTE — Telephone Encounter (Signed)
Patient DM can we give sample?

## 2021-12-29 NOTE — Telephone Encounter (Signed)
Spoke with Stu at [facility] and they do have the 0.25 available.  Prescription sent to them.  Patient aware via voicemail.

## 2021-12-29 NOTE — Addendum Note (Signed)
Addended by: Michaela Corner on: 12/29/2021 04:15 PM   Modules accepted: Orders

## 2021-12-30 LAB — COLOGUARD: COLOGUARD: NEGATIVE

## 2022-02-17 NOTE — Progress Notes (Signed)
Subjective:    Patient ID: [name], male    DOB: 1961/09/26, 61 y.o.   MRN: [id number]   Chief Complaint: medical management of chronic issues     HPI:  [name] is a 61 y.o. who identifies as a male who was assigned male at birth.   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 Primary hypertension No c/o chest pain, sob or headache. Does not check blood pressure at home. BP Readings from Last 3 Encounters:  11/10/21 103/73  08/06/21 97/71  07/09/21 95/69     2. Hyperlipidemia with target LDL less than 100 Doe sot really wtahc diet and does no dedictaed exercise. Lab Results  Component Value Date   CHOL 144 11/10/2021   HDL 42 11/10/2021   LDLCALC 73 11/10/2021   TRIG 167 (H) 11/10/2021   CHOLHDL 3.4 11/10/2021  The 10-year ASCVD risk score (Arnett DK, et al., 2019) is: 14.9%    3. Type 2 diabetes mellitus with diabetic polyneuropathy, without long-term current use of insulin ([place]) He is not very compliant with his diet. He is on ozempic, metformin and farxiga. Fasting blood sugars are running 100-110- denies any low blood sugars. Lab Results  Component Value Date   HGBA1C 5.8 (H) 11/10/2021    4. Neuropathy Has burning in bil feet all the time. Gabapentin helps  5. Gastroesophageal reflux disease without esophagitis I son nexium daily an dis doing well.  6. Degeneration of lumbar intervertebral disc Has chronic back pain- the gabapentin he takes helps. He says he will take an occasional tylenol. Rates pain 3-4/10.  7. BPH with obstruction/lower urinary tract symptoms Is on no prescription meds for this has no voiding issues. He sees urology 1x a year but did not see them last year.  8. Recurrent major depressive disorder, in full remission ([place]) Is on zoloft and is doing well. Depression screen [facility] 2/9 02/18/2022 11/10/2021 08/06/2021   Decreased Interest 1 2 0  Down, Depressed, Hopeless _0 PHQ - 2 Score _1 Altered sleeping 0 2 0  Tired, decreased energy _2 Change in appetite 1 0 2  Feeling bad or failure about yourself  0 0 0  Trouble concentrating 0 2 0  Moving slowly or fidgety/restless 0 0 0  Suicidal thoughts 0 0 0  PHQ-9 Score _3 Difficult doing work/chores Not difficult at all Not difficult at all Not difficult at all  Some recent data might be hidden     9. Primary insomnia Valium that he takes usually helps him with his sleep. Sleeps about 6-7 hours a night  10. BMI 29.0-29.9,adult Weight I sup 7lbs Wt Readings from Last 3 Encounters:  02/18/22 212 lb (96.2 kg)  11/10/21 205 lb (93 kg)  08/06/21 209 lb (94.8 kg)   BMI Readings from Last 3 Encounters:  02/18/22 30.42 kg/m  11/10/21 29.41 kg/m  08/06/21 29.99 kg/m     New complaints: None today  Allergies  Allergen Reactions   Acetaminophen    Codeine     anxiety   Lipitor [Atorvastatin]     Myalgia   Outpatient Encounter Medications as of 02/18/2022  Medication Sig   blood glucose meter kit and supplies Dispense based on patient and insurance preference. Use up to four times daily as directed. (FOR ICD-10 E10.9,  E11.9).   Continuous Blood Gluc Receiver (FREESTYLE LIBRE 2 READER) DEVI 1 each by Does not apply route every 14 (fourteen) days.   Continuous Blood Gluc Sensor (FREESTYLE LIBRE 2 SENSOR) MISC USE TO CHECK SUGAR  Dx: E11.9   cyclobenzaprine (FLEXERIL) 10 MG tablet Take 10 mg by mouth 3 (three) times daily.   dapagliflozin propanediol (FARXIGA) 10 MG TABS tablet Take 1 tablet (10 mg total) by mouth daily. TAKE 1 TABLET (10 MG TOTAL) BY MOUTH DAILY BEFORE BREAKFAST.   diazepam (VALIUM) 5 MG tablet Take 1-2 tablets (5-10 mg total) by mouth every 6 (six) hours as needed.   esomeprazole (NEXIUM) 40 MG capsule Take 1 capsule (40 mg total) by mouth daily at 12 noon.   ezetimibe (ZETIA) 10 MG tablet Take 1 tablet (10  mg total) by mouth daily.   fluticasone (FLONASE) 50 MCG/ACT nasal spray USE 1 SPRAY IN EACH NOSTRIL ONCE DAILY   gabapentin (NEURONTIN) 300 MG capsule TAKE 1 CAPSULE BY MOUTH TWICE A DAY   glucose blood (ACCU-CHEK GUIDE) test strip Check BS up to 4 times daily Dx e11.9   hydrocortisone (ANUSOL-HC) 2.5 % rectal cream Place 1 application rectally 2 (two) times daily.   loratadine (CLARITIN) 10 MG tablet TAKE ONE (1) TABLET EACH DAY   meloxicam (MOBIC) 15 MG tablet TAKE 1 TABLET BY MOUTH EVERY DAY WITH FOOD   metFORMIN (GLUCOPHAGE) 1000 MG tablet TAKE 1 TABLET BY MOUTH 2 TIMES DAILY WITH A MEAL. (NEEDS TO BE SEEN BEFORE NEXT REFILL)   Multiple Vitamin (MULTIVITAMIN WITH MINERALS) TABS Take 1 tablet by mouth daily.   olmesartan-hydrochlorothiazide (BENICAR HCT) 40-25 MG tablet TAKE 1 TABLET BY MOUTH EACH DAY.  Needs to be seen for further refills.   olopatadine (PATANOL) 0.1 % ophthalmic solution PLACE 1 DROP IN EACH EYE TWICE DAILY   ONETOUCH DELICA LANCETS FINE MISC TEST UP TO 4 TIMES DIALY   Semaglutide,0.25 or 0.5MG/DOS, (OZEMPIC, 0.25 OR 0.5 MG/DOSE,) 2 MG/1.5ML SOPN 0.028m weekly for 2 weeks then 0.581mweekly  Dx:  E11.9   sertraline (ZOLOFT) 100 MG tablet 2 po qd   simvastatin (ZOCOR) 40 MG tablet TAKE 1 TABLET BY MOUTH EVERYDAY AT BEDTIME   tadalafil (CIALIS) 5 MG tablet TAKE 1 TABLET BY MOUTH DAILY AS NEEDED FOR ERECTILE DYSFUNCTION.   zolpidem (AMBIEN) 10 MG tablet Take 1 tablet (10 mg total) by mouth at bedtime as needed.   No facility-administered encounter medications on file as of 02/18/2022.    Past Surgical History:  Procedure Laterality Date   BACK SURGERY  [id number] lumb fusion   COLONOSCOPY     KNEE ARTHROSCOPY WITH MEDIAL MENISECTOMY Right 09/26/2013   Procedure: RIGHT KNEE ARTHROSCOPY WITH PARTIAL MEDIAL and lateral chrondroplasty MENISECTOMY;  Surgeon: RoLorn JunesMD;  [place] [facility];  Laterality: Right;   left elbow  surgery     LITHOTRIPSY     NASAL SEPTUM SURGERY     removal of kidney stones  1981   lt open removal   SHOULDER ARTHROSCOPY WITH ROTATOR CUFF REPAIR AND SUBACROMIAL DECOMPRESSION Left 09/26/2013   Procedure: LEFT SHOULDER ARTHROSCOPY WITH DEBRIDEMENT, PARTIAL ROTATOR CUFF REPAIR AND SUBACROMIAL DECOMPRESSION AND SAD ACD DISTAL CLAVICULECTOMY;  Surgeon: RoLorn JunesMD;  [place] [facility] [facility] [place];  Laterality: Left;   TONSILLECTOMY      Family History  Problem Relation Age of Onset   COPD Mother    Hypertension Father  Alzheimer's disease Father    Diabetes Maternal Grandmother    Kidney disease Neg Hx    Prostate cancer Neg Hx       Controlled substance contract: 07/10/21     Review of Systems  Constitutional:  Negative for diaphoresis.  Eyes:  Negative for pain.  Respiratory:  Negative for shortness of breath.   Cardiovascular:  Negative for chest pain, palpitations and leg swelling.  Gastrointestinal:  Negative for abdominal pain.  Endocrine: Negative for polydipsia.  Skin:  Negative for rash.  Neurological:  Negative for dizziness, weakness and headaches.  Hematological:  Does not bruise/bleed easily.  All other systems reviewed and are negative.     Objective:   Physical Exam Vitals and nursing note reviewed.  Constitutional:      Appearance: Normal appearance. He is well-developed.  HENT:     Head: Normocephalic.     Nose: Nose normal.     Mouth/Throat:     Mouth: Mucous membranes are moist.     Pharynx: Oropharynx is clear.  Eyes:     Pupils: Pupils are equal, round, and reactive to light.  Neck:     Thyroid: No thyroid mass or thyromegaly.     Vascular: No carotid bruit or JVD.     Trachea: Phonation normal.  Cardiovascular:     Rate and Rhythm: Normal rate and regular rhythm.  Pulmonary:     Effort: Pulmonary effort is normal. No respiratory distress.     Breath sounds: Normal breath sounds.  Abdominal:      General: Bowel sounds are normal.     Palpations: Abdomen is soft.     Tenderness: There is no abdominal tenderness.  Musculoskeletal:        General: Normal range of motion.     Cervical back: Normal range of motion and neck supple.  Lymphadenopathy:     Cervical: No cervical adenopathy.  Skin:    General: Skin is warm and dry.  Neurological:     Mental Status: He is alert and oriented to person, place, and time.  Psychiatric:        Behavior: Behavior normal.        Thought Content: Thought content normal.        Judgment: Judgment normal.    BP 112/75    Pulse 82    Temp 98.3 F (36.8 C) (Temporal)    Resp 20    Ht _0  (1.778 m)    Wt 212 lb (96.2 kg)    SpO2 96%    BMI 30.42 kg/m   HGBA1c 6.4     Assessment & Plan:   [name] comes in today with chief complaint of Medical Management of Chronic Issues   Diagnosis and orders addressed:  1. Primary hypertension Low sodium diet - CBC with Differential/Platelet - CMP14+EGFR - olmesartan-hydrochlorothiazide (BENICAR HCT) 40-25 MG tablet; TAKE 1 TABLET BY MOUTH EACH DAY.  Needs to be seen for further refills.  Dispense: 90 tablet; Refill: 1  2. Hyperlipidemia with target LDL less than 100 Low fat diet - Lipid panel - esomeprazole (NEXIUM) 40 MG capsule; Take 1 capsule (40 mg total) by mouth daily at 12 noon.  Dispense: 90 capsule; Refill: 1 - ezetimibe (ZETIA) 10 MG tablet; Take 1 tablet (10 mg total) by mouth daily.  Dispense: 90 tablet; Refill: 1 - simvastatin (ZOCOR) 40 MG tablet; TAKE 1 TABLET BY MOUTH EVERYDAY AT BEDTIME  Dispense: 90 tablet; Refill: 1  3. Type 2 diabetes  mellitus with diabetic polyneuropathy, without long-term current use of insulin (HCC) Continue to watch carbs in diet - Bayer DCA Hb A1c Waived - Semaglutide,0.25 or 0.5MG/DOS, (OZEMPIC, 0.25 OR 0.5 MG/DOSE,) 2 MG/1.5ML SOPN; 0.06m weekly for 2 weeks then 0.589mweekly  Dx:  E11.9  Dispense: 3 mL; Refill: 2 - metFORMIN (GLUCOPHAGE) 1000 MG  tablet; TAKE 1 TABLET BY MOUTH 2 TIMES DAILY WITH A MEAL. (NEEDS TO BE SEEN BEFORE NEXT REFILL)  Dispense: 180 tablet; Refill: 1 - dapagliflozin propanediol (FARXIGA) 10 MG TABS tablet; Take 1 tablet (10 mg total) by mouth daily. TAKE 1 TABLET (10 MG TOTAL) BY MOUTH DAILY BEFORE BREAKFAST.  Dispense: 90 tablet; Refill: 1  4. Neuropathy Check feet daily  5. Gastroesophageal reflux disease without esophagitis Avoid spicy foods Do not eat 2 hours prior to bedtime - esomeprazole (NEXIUM) 40 MG capsule; Take 1 capsule (40 mg total) by mouth daily at 12 noon.  Dispense: 90 capsule; Refill: 1 - sertraline (ZOLOFT) 100 MG tablet; 2 po qd  Dispense: 180 tablet; Refill: 1  6. Degeneration of lumbar intervertebral disc Moist heat - gabapentin (NEURONTIN) 300 MG capsule; Take 1 capsule (300 mg total) by mouth 2 (two) times daily.  Dispense: 180 capsule; Refill: 1 - diazepam (VALIUM) 5 MG tablet; Take 1-2 tablets (5-10 mg total) by mouth every 6 (six) hours as needed.  Dispense: 180 tablet; Refill: 1  7. BPH with obstruction/lower urinary tract symptoms Need to follow up with urology  8. Recurrent major depressive disorder, in full remission ([place] - sertraline (ZOLOFT) 100 MG tablet; 2 po qd  Dispense: 180 tablet; Refill: 1  9. Primary insomnia Bedtime routine - zolpidem (AMBIEN) 10 MG tablet; Take 1 tablet (10 mg total) by mouth at bedtime as needed.  Dispense: 90 tablet; Refill: 1  10. BMI 30.0-30.9,adult Discussed diet and exercise for person with BMI >25 Will recheck weight in 3-6 months   11. Erectile dysfunction of organic origin   Labs pending Health Maintenance reviewed Diet and exercise encouraged  Follow up plan: 3 months   Mary-Margaret MaHassell DoneFNP

## 2022-02-18 ENCOUNTER — Ambulatory Visit (INDEPENDENT_AMBULATORY_CARE_PROVIDER_SITE_OTHER): Payer: PPO | Admitting: Nurse Practitioner

## 2022-02-18 ENCOUNTER — Encounter: Payer: Self-pay | Admitting: Nurse Practitioner

## 2022-02-18 VITALS — BP 112/75 | HR 82 | Temp 98.3°F | Resp 20 | Ht 70.0 in | Wt 212.0 lb

## 2022-02-18 DIAGNOSIS — I10 Essential (primary) hypertension: Secondary | ICD-10-CM | POA: Diagnosis not present

## 2022-02-18 DIAGNOSIS — E785 Hyperlipidemia, unspecified: Secondary | ICD-10-CM

## 2022-02-18 DIAGNOSIS — N138 Other obstructive and reflux uropathy: Secondary | ICD-10-CM

## 2022-02-18 DIAGNOSIS — Z6830 Body mass index (BMI) 30.0-30.9, adult: Secondary | ICD-10-CM | POA: Diagnosis not present

## 2022-02-18 DIAGNOSIS — F3342 Major depressive disorder, recurrent, in full remission: Secondary | ICD-10-CM

## 2022-02-18 DIAGNOSIS — M5136 Other intervertebral disc degeneration, lumbar region: Secondary | ICD-10-CM

## 2022-02-18 DIAGNOSIS — K219 Gastro-esophageal reflux disease without esophagitis: Secondary | ICD-10-CM | POA: Diagnosis not present

## 2022-02-18 DIAGNOSIS — N529 Male erectile dysfunction, unspecified: Secondary | ICD-10-CM | POA: Diagnosis not present

## 2022-02-18 DIAGNOSIS — F5101 Primary insomnia: Secondary | ICD-10-CM | POA: Diagnosis not present

## 2022-02-18 DIAGNOSIS — N401 Enlarged prostate with lower urinary tract symptoms: Secondary | ICD-10-CM

## 2022-02-18 DIAGNOSIS — E1142 Type 2 diabetes mellitus with diabetic polyneuropathy: Secondary | ICD-10-CM | POA: Diagnosis not present

## 2022-02-18 DIAGNOSIS — G629 Polyneuropathy, unspecified: Secondary | ICD-10-CM

## 2022-02-18 DIAGNOSIS — Z23 Encounter for immunization: Secondary | ICD-10-CM

## 2022-02-18 DIAGNOSIS — Z6832 Body mass index (BMI) 32.0-32.9, adult: Secondary | ICD-10-CM

## 2022-02-18 LAB — BAYER DCA HB A1C WAIVED: HB A1C (BAYER DCA - WAIVED): 6.4 % — ABNORMAL HIGH (ref 4.8–5.6)

## 2022-02-18 LAB — LIPID PANEL

## 2022-02-18 MED ORDER — OZEMPIC (0.25 OR 0.5 MG/DOSE) 2 MG/1.5ML ~~LOC~~ SOPN: PEN_INJECTOR | SUBCUTANEOUS | 2 refills | Status: DC

## 2022-02-18 MED ORDER — SERTRALINE HCL 100 MG PO TABS: ORAL_TABLET | ORAL | 1 refills | Status: DC

## 2022-02-18 MED ORDER — GABAPENTIN 300 MG PO CAPS: 300.0000 mg | ORAL_CAPSULE | Freq: Two times a day (BID) | ORAL | 1 refills | Status: DC

## 2022-02-18 MED ORDER — OLMESARTAN MEDOXOMIL-HCTZ 40-25 MG PO TABS: ORAL_TABLET | ORAL | 1 refills | Status: DC

## 2022-02-18 MED ORDER — SIMVASTATIN 40 MG PO TABS: ORAL_TABLET | ORAL | 1 refills | Status: DC

## 2022-02-18 MED ORDER — EZETIMIBE 10 MG PO TABS: 10.0000 mg | ORAL_TABLET | Freq: Every day | ORAL | 1 refills | Status: DC

## 2022-02-18 MED ORDER — METFORMIN HCL 1000 MG PO TABS: ORAL_TABLET | ORAL | 1 refills | Status: DC

## 2022-02-18 MED ORDER — DAPAGLIFLOZIN PROPANEDIOL 10 MG PO TABS: 10.0000 mg | ORAL_TABLET | Freq: Every day | ORAL | 1 refills | Status: DC

## 2022-02-18 MED ORDER — DIAZEPAM 5 MG PO TABS: 5.0000 mg | ORAL_TABLET | Freq: Four times a day (QID) | ORAL | 1 refills | Status: DC | PRN

## 2022-02-18 MED ORDER — ZOLPIDEM TARTRATE 10 MG PO TABS: 10.0000 mg | ORAL_TABLET | Freq: Every evening | ORAL | 1 refills | Status: DC | PRN

## 2022-02-18 MED ORDER — ESOMEPRAZOLE MAGNESIUM 40 MG PO CPDR: 40.0000 mg | DELAYED_RELEASE_CAPSULE | Freq: Every day | ORAL | 1 refills | Status: DC

## 2022-02-18 NOTE — Patient Instructions (Signed)

## 2022-02-19 LAB — CBC WITH DIFFERENTIAL/PLATELET
Basophils Absolute: 0.1 10*3/uL (ref 0.0–0.2)
Basos: 1 %
EOS (ABSOLUTE): 0.2 10*3/uL (ref 0.0–0.4)
Eos: 4 %
Hematocrit: 37.3 % — ABNORMAL LOW (ref 37.5–51.0)
Hemoglobin: 12.7 g/dL — ABNORMAL LOW (ref 13.0–17.7)
Immature Grans (Abs): 0 10*3/uL (ref 0.0–0.1)
Immature Granulocytes: 1 %
Lymphocytes Absolute: 2.8 10*3/uL (ref 0.7–3.1)
Lymphs: 42 %
MCH: 30.8 pg (ref 26.6–33.0)
MCHC: 34 g/dL (ref 31.5–35.7)
MCV: 91 fL (ref 79–97)
Monocytes Absolute: 0.4 10*3/uL (ref 0.1–0.9)
Monocytes: 7 %
Neutrophils Absolute: 3 10*3/uL (ref 1.4–7.0)
Neutrophils: 45 %
Platelets: 174 10*3/uL (ref 150–450)
RBC: 4.12 x10E6/uL — ABNORMAL LOW (ref 4.14–5.80)
RDW: 12.7 % (ref 11.6–15.4)
WBC: 6.6 10*3/uL (ref 3.4–10.8)

## 2022-02-19 LAB — CMP14+EGFR
ALT: 16 IU/L (ref 0–44)
AST: 21 IU/L (ref 0–40)
Albumin/Globulin Ratio: 2.3 — ABNORMAL HIGH (ref 1.2–2.2)
Albumin: 4.8 g/dL (ref 3.8–4.8)
Alkaline Phosphatase: 48 IU/L (ref 44–121)
BUN/Creatinine Ratio: 21 (ref 10–24)
BUN: 29 mg/dL — ABNORMAL HIGH (ref 8–27)
Bilirubin Total: 0.2 mg/dL (ref 0.0–1.2)
CO2: 20 mmol/L (ref 20–29)
Calcium: 9.5 mg/dL (ref 8.6–10.2)
Chloride: 103 mmol/L (ref 96–106)
Creatinine, Ser: 1.4 mg/dL — ABNORMAL HIGH (ref 0.76–1.27)
Globulin, Total: 2.1 g/dL (ref 1.5–4.5)
Glucose: 166 mg/dL — ABNORMAL HIGH (ref 70–99)
Potassium: 4.9 mmol/L (ref 3.5–5.2)
Sodium: 139 mmol/L (ref 134–144)
Total Protein: 6.9 g/dL (ref 6.0–8.5)
eGFR: 57 mL/min/{1.73_m2} — ABNORMAL LOW (ref >59)

## 2022-02-19 LAB — LIPID PANEL
Chol/HDL Ratio: 2.5 ratio (ref 0.0–5.0)
Cholesterol, Total: 115 mg/dL (ref 100–199)
HDL: 46 mg/dL (ref >39)
LDL Chol Calc (NIH): 42 mg/dL (ref 0–99)
Triglycerides: 159 mg/dL — ABNORMAL HIGH (ref 0–149)
VLDL Cholesterol Cal: 27 mg/dL (ref 5–40)

## 2022-02-19 NOTE — Addendum Note (Signed)
Addended by: Rolena Infante on: 02/19/2022 10:49 AM ? ? Modules accepted: Orders ? ?

## 2022-03-03 ENCOUNTER — Other Ambulatory Visit: Payer: Self-pay | Admitting: Nurse Practitioner

## 2022-03-03 DIAGNOSIS — E1142 Type 2 diabetes mellitus with diabetic polyneuropathy: Secondary | ICD-10-CM

## 2022-03-26 ENCOUNTER — Other Ambulatory Visit: Payer: Self-pay | Admitting: Nurse Practitioner

## 2022-03-26 DIAGNOSIS — F5101 Primary insomnia: Secondary | ICD-10-CM

## 2022-04-29 ENCOUNTER — Telehealth: Payer: Self-pay | Admitting: Nurse Practitioner

## 2022-04-30 NOTE — Telephone Encounter (Signed)
Patient should be able to get at the pharmacy Shortages have lifted My scheduler and I have attempted to reach out to patient 3 times and haven't heard back We do not have any samples at this time  If it's a cost issue--I could help with this! But he has to make appt/answer his phone and be able to produce financial/income proof for patient assistance

## 2022-04-30 NOTE — Telephone Encounter (Signed)
Patient calling to check on status.

## 2022-05-05 ENCOUNTER — Telehealth: Payer: Self-pay

## 2022-05-05 NOTE — Chronic Care Management (AMB) (Signed)
  Chronic Care Management Note  05/05/2022 Name: [name] MRN: [id number] DOB: [birth year]  [name] is a 61 y.o. year old male who is a primary care patient of Chevis Pretty, [place] and is actively engaged with the care management team. I reached out to [name] by phone today to assist with scheduling an initial visit with the Pharmacist  Follow up plan: Unsuccessful telephone outreach attempt made. A HIPAA compliant phone message was left for the patient providing contact information and requesting a return call.  The care management team will reach out to the patient again over the next 5 days.  If patient returns call to provider office, please advise to call [facility]  at [address], [place], [place], [place] [address] Direct Dial: [phone] Lasheka [email or web address] Website: [email or web address]

## 2022-05-11 NOTE — Chronic Care Management (AMB) (Signed)
  Chronic Care Management Note  05/11/2022 Name: [name] MRN: [id number] DOB: [birth year]  [name] is a 61 y.o. year old male who is a primary care patient of Chevis Pretty, [place] and is actively engaged with the care management team. I reached out to [name] by phone today to assist with re-scheduling an initial visit with the Pharmacist  Follow up plan: Unsuccessful telephone outreach attempt made. A HIPAA compliant phone message was left for the patient providing contact information and requesting a return call.  The care management team will reach out to the patient again over the next 7 days.  If patient returns call to provider office, please advise to call [facility]  at [place] [address], [place], [place], [place] [address] Direct Dial: [phone] [email or web address] Website: [email or web address]

## 2022-05-18 NOTE — Chronic Care Management (AMB) (Signed)
  Chronic Care Management Note  05/18/2022 Name: [name] MRN: [id number] DOB: Jul 30, 1961  [name] is a 61 y.o. year old male who is a primary care patient of Chevis Pretty, [place] and is actively engaged with the care management team. I reached out to [name] by phone today to assist with scheduling an initial visit with the Pharmacist  Follow up plan: Unable to make contact on outreach attempts x 3. PCP Chevis Pretty, FNP notified via routed documentation in medical record.   Noreene Larsson, [place], [place], [place] [address] Direct Dial: [phone] [email or web address] Website: [email or web address]

## 2022-05-26 ENCOUNTER — Encounter: Payer: Self-pay | Admitting: Nurse Practitioner

## 2022-05-26 ENCOUNTER — Ambulatory Visit (INDEPENDENT_AMBULATORY_CARE_PROVIDER_SITE_OTHER): Payer: PPO | Admitting: Nurse Practitioner

## 2022-05-26 VITALS — BP 112/78 | HR 77 | Temp 98.0°F | Resp 20 | Ht 70.0 in | Wt 213.0 lb

## 2022-05-26 DIAGNOSIS — G629 Polyneuropathy, unspecified: Secondary | ICD-10-CM

## 2022-05-26 DIAGNOSIS — N138 Other obstructive and reflux uropathy: Secondary | ICD-10-CM | POA: Diagnosis not present

## 2022-05-26 DIAGNOSIS — E1142 Type 2 diabetes mellitus with diabetic polyneuropathy: Secondary | ICD-10-CM | POA: Diagnosis not present

## 2022-05-26 DIAGNOSIS — M5136 Other intervertebral disc degeneration, lumbar region: Secondary | ICD-10-CM

## 2022-05-26 DIAGNOSIS — E785 Hyperlipidemia, unspecified: Secondary | ICD-10-CM | POA: Diagnosis not present

## 2022-05-26 DIAGNOSIS — Z6832 Body mass index (BMI) 32.0-32.9, adult: Secondary | ICD-10-CM

## 2022-05-26 DIAGNOSIS — F3342 Major depressive disorder, recurrent, in full remission: Secondary | ICD-10-CM | POA: Diagnosis not present

## 2022-05-26 DIAGNOSIS — M48062 Spinal stenosis, lumbar region with neurogenic claudication: Secondary | ICD-10-CM | POA: Diagnosis not present

## 2022-05-26 DIAGNOSIS — F5101 Primary insomnia: Secondary | ICD-10-CM

## 2022-05-26 DIAGNOSIS — I10 Essential (primary) hypertension: Secondary | ICD-10-CM | POA: Diagnosis not present

## 2022-05-26 DIAGNOSIS — K219 Gastro-esophageal reflux disease without esophagitis: Secondary | ICD-10-CM

## 2022-05-26 DIAGNOSIS — N401 Enlarged prostate with lower urinary tract symptoms: Secondary | ICD-10-CM

## 2022-05-26 DIAGNOSIS — Z23 Encounter for immunization: Secondary | ICD-10-CM

## 2022-05-26 LAB — BAYER DCA HB A1C WAIVED: HB A1C (BAYER DCA - WAIVED): 6.2 % — ABNORMAL HIGH (ref 4.8–5.6)

## 2022-05-26 MED ORDER — DAPAGLIFLOZIN PROPANEDIOL 10 MG PO TABS: 10.0000 mg | ORAL_TABLET | Freq: Every day | ORAL | 1 refills | Status: DC

## 2022-05-26 MED ORDER — ESOMEPRAZOLE MAGNESIUM 40 MG PO CPDR: 40.0000 mg | DELAYED_RELEASE_CAPSULE | Freq: Every day | ORAL | 1 refills | Status: DC

## 2022-05-26 MED ORDER — OLMESARTAN MEDOXOMIL-HCTZ 40-25 MG PO TABS: ORAL_TABLET | ORAL | 1 refills | Status: DC

## 2022-05-26 MED ORDER — GABAPENTIN 300 MG PO CAPS: 300.0000 mg | ORAL_CAPSULE | Freq: Two times a day (BID) | ORAL | 1 refills | Status: DC

## 2022-05-26 MED ORDER — OZEMPIC (0.25 OR 0.5 MG/DOSE) 2 MG/1.5ML ~~LOC~~ SOPN: PEN_INJECTOR | SUBCUTANEOUS | 2 refills | Status: DC

## 2022-05-26 MED ORDER — EZETIMIBE 10 MG PO TABS
10.0000 mg | ORAL_TABLET | Freq: Every day | ORAL | 1 refills | Status: DC
Start: 2022-05-26 — End: 2022-11-25

## 2022-05-26 MED ORDER — DIAZEPAM 5 MG PO TABS: 5.0000 mg | ORAL_TABLET | Freq: Four times a day (QID) | ORAL | 1 refills | Status: DC | PRN

## 2022-05-26 MED ORDER — METFORMIN HCL 1000 MG PO TABS
ORAL_TABLET | ORAL | 1 refills | Status: DC
Start: 2022-05-26 — End: 2022-11-25

## 2022-05-26 MED ORDER — SIMVASTATIN 40 MG PO TABS: ORAL_TABLET | ORAL | 1 refills | Status: DC

## 2022-05-26 MED ORDER — SERTRALINE HCL 100 MG PO TABS: ORAL_TABLET | ORAL | 1 refills | Status: DC

## 2022-05-26 MED ORDER — ZOLPIDEM TARTRATE 10 MG PO TABS: 10.0000 mg | ORAL_TABLET | Freq: Every evening | ORAL | 1 refills | Status: DC | PRN

## 2022-05-26 NOTE — Progress Notes (Signed)
Subjective:    Patient ID: [name], male    DOB: November 22, 1961, 61 y.o.   MRN: [id number]  Chief Complaint: medical management of chronic issues     HPI:  [name] is a 61 y.o. who identifies as a male who was assigned male at birth.   Social history: Lives with: by himself Work history: retired   Scientist, forensic in today for follow up of the following chronic medical issues:  1. Primary hypertension No c/o chest pain, sob or headache. Does not check blood pressure at home BP Readings from Last 3 Encounters:  02/18/22 112/75  11/10/21 103/73  08/06/21 97/71     2. Type 2 diabetes mellitus with diabetic polyneuropathy, without long-term current use of insulin (HCC) Fasting blood sugars are running around 130-170. Deneis any low blood sugars. Lab Results  Component Value Date   HGBA1C 6.4 (H) 02/18/2022     3. Hyperlipidemia with target LDL less than 100 Does try to watch diet and stays very active. Lab Results  Component Value Date   CHOL 115 02/18/2022   HDL 46 02/18/2022   LDLCALC 42 02/18/2022   TRIG 159 (H) 02/18/2022   CHOLHDL 2.5 02/18/2022   The ASCVD Risk score (Arnett DK, et al., 2019) failed to calculate for the following reasons:   The valid total cholesterol range is 130 to 320 mg/dL   4. Gastroesophageal reflux disease without esophagitis Is on nexium daily and that seems to work well for him.  5. Lumbar stenosis with neurogenic claudication Chronic back pain which is tolerable. He takes tylenol or motrin when needed.  6. Neuropathy Has neuropathy in bil feet from back pain. Is on neurontin which helps  7. BPH with obstruction/lower urinary tract symptoms Denies any voiding issues. Sees urology yearly.  8. Recurrent major depressive disorder, in full remission ([place]) Is on zoloft and is doing well.    05/26/2022    8:31 AM 05/26/2022    8:21 AM 02/18/2022    8:07 AM  Depression screen PHQ 2/9  Decreased Interest 0 1 1  Down, Depressed,  Hopeless 0 1 1  PHQ - 2 Score 0 2 2  Altered sleeping 0 0 0  Tired, decreased energy 0 1 1  Change in appetite 0 1 1  Feeling bad or failure about yourself  0 0 0  Trouble concentrating 0 1 0  Moving slowly or fidgety/restless 0 0 0  Suicidal thoughts 0 0 0  PHQ-9 Score 0 5 4  Difficult doing work/chores Not difficult at all Not difficult at all Not difficult at all    9. Primary insomnia Is on ambien to sleeps at night. Sleeps about 8 hours a night.  10. BMI 32.0-32.9,adult No recent weight changes Wt Readings from Last 3 Encounters:  05/26/22 213 lb (96.6 kg)  02/18/22 212 lb (96.2 kg)  11/10/21 205 lb (93 kg)   BMI Readings from Last 3 Encounters:  05/26/22 30.56 kg/m  02/18/22 30.42 kg/m  11/10/21 29.41 kg/m     New complaints: None today  Allergies  Allergen Reactions   Acetaminophen    Codeine     anxiety   Lipitor [Atorvastatin]     Myalgia   Outpatient Encounter Medications as of 05/26/2022  Medication Sig   blood glucose meter kit and supplies Dispense based on patient and insurance preference. Use up to four times daily as directed. (FOR ICD-10 E10.9, E11.9).   Continuous Blood Gluc Receiver (FREESTYLE LIBRE 2 READER)  DEVI 1 each by Does not apply route every 14 (fourteen) days.   Continuous Blood Gluc Sensor (FREESTYLE LIBRE 2 SENSOR) MISC USE TO CHECK SUGAR DX: E11.9   cyclobenzaprine (FLEXERIL) 10 MG tablet Take 10 mg by mouth 3 (three) times daily.   dapagliflozin propanediol (FARXIGA) 10 MG TABS tablet Take 1 tablet (10 mg total) by mouth daily. TAKE 1 TABLET (10 MG TOTAL) BY MOUTH DAILY BEFORE BREAKFAST.   diazepam (VALIUM) 5 MG tablet Take 1-2 tablets (5-10 mg total) by mouth every 6 (six) hours as needed.   esomeprazole (NEXIUM) 40 MG capsule Take 1 capsule (40 mg total) by mouth daily at 12 noon.   ezetimibe (ZETIA) 10 MG tablet Take 1 tablet (10 mg total) by mouth daily.   fluticasone (FLONASE) 50 MCG/ACT nasal spray USE 1 SPRAY IN EACH NOSTRIL  ONCE DAILY   gabapentin (NEURONTIN) 300 MG capsule Take 1 capsule (300 mg total) by mouth 2 (two) times daily.   glucose blood (ACCU-CHEK GUIDE) test strip Check BS up to 4 times daily Dx e11.9   hydrocortisone (ANUSOL-HC) 2.5 % rectal cream Place 1 application rectally 2 (two) times daily.   loratadine (CLARITIN) 10 MG tablet TAKE ONE (1) TABLET EACH DAY   meloxicam (MOBIC) 15 MG tablet TAKE 1 TABLET BY MOUTH EVERY DAY WITH FOOD   metFORMIN (GLUCOPHAGE) 1000 MG tablet TAKE 1 TABLET BY MOUTH 2 TIMES DAILY WITH A MEAL. (NEEDS TO BE SEEN BEFORE NEXT REFILL)   Multiple Vitamin (MULTIVITAMIN WITH MINERALS) TABS Take 1 tablet by mouth daily.   olmesartan-hydrochlorothiazide (BENICAR HCT) 40-25 MG tablet TAKE 1 TABLET BY MOUTH EACH DAY.  Needs to be seen for further refills.   olopatadine (PATANOL) 0.1 % ophthalmic solution PLACE 1 DROP IN EACH EYE TWICE DAILY   ONETOUCH DELICA LANCETS FINE MISC TEST UP TO 4 TIMES DIALY   Semaglutide,0.25 or 0.5MG/DOS, (OZEMPIC, 0.25 OR 0.5 MG/DOSE,) 2 MG/1.5ML SOPN 0.065m weekly for 2 weeks then 0.571mweekly  Dx:  E11.9   sertraline (ZOLOFT) 100 MG tablet 2 po qd   simvastatin (ZOCOR) 40 MG tablet TAKE 1 TABLET BY MOUTH EVERYDAY AT BEDTIME   tadalafil (CIALIS) 5 MG tablet TAKE 1 TABLET BY MOUTH DAILY AS NEEDED FOR ERECTILE DYSFUNCTION.   zolpidem (AMBIEN) 10 MG tablet Take 1 tablet (10 mg total) by mouth at bedtime as needed.   No facility-administered encounter medications on file as of 05/26/2022.    Past Surgical History:  Procedure Laterality Date   BACK SURGERY  [id number] lumb fusion   COLONOSCOPY     KNEE ARTHROSCOPY WITH MEDIAL MENISECTOMY Right 09/26/2013   Procedure: RIGHT KNEE ARTHROSCOPY WITH PARTIAL MEDIAL and lateral chrondroplasty MENISECTOMY;  Surgeon: RoLorn JunesMD;  [facility] Service: Orthopedics;  Laterality: Right;   left elbow surgery     LITHOTRIPSY     NASAL SEPTUM SURGERY     removal of kidney stones   1981   lt open removal   SHOULDER ARTHROSCOPY WITH ROTATOR CUFF REPAIR AND SUBACROMIAL DECOMPRESSION Left 09/26/2013   Procedure: LEFT SHOULDER ARTHROSCOPY WITH DEBRIDEMENT, PARTIAL ROTATOR CUFF REPAIR AND SUBACROMIAL DECOMPRESSION AND SAD ACD DISTAL CLAVICULECTOMY;  Surgeon: RoLorn JunesMD;  [facility] Service: [facility]opedics;  Laterality: Left;   TONSILLECTOMY      Family History  Problem Relation Age of Onset   COPD Mother    Hypertension Father    Alzheimer's disease Father  Diabetes Maternal Grandmother    Kidney disease Neg Hx    Prostate cancer Neg Hx       Controlled substance contract: n/a     Review of Systems  Constitutional:  Negative for diaphoresis.  Eyes:  Negative for pain.  Respiratory:  Negative for shortness of breath.   Cardiovascular:  Negative for chest pain, palpitations and leg swelling.  Gastrointestinal:  Negative for abdominal pain.  Endocrine: Negative for polydipsia.  Skin:  Negative for rash.  Neurological:  Negative for dizziness, weakness and headaches.  Hematological:  Does not bruise/bleed easily.  All other systems reviewed and are negative.      Objective:   Physical Exam Vitals and nursing note reviewed.  Constitutional:      Appearance: Normal appearance. He is well-developed.  HENT:     Head: Normocephalic.     Nose: Nose normal.     Mouth/Throat:     Mouth: Mucous membranes are moist.     Pharynx: Oropharynx is clear.  Eyes:     Pupils: Pupils are equal, round, and reactive to light.  Neck:     Thyroid: No thyroid mass or thyromegaly.     Vascular: No carotid bruit or JVD.     Trachea: Phonation normal.  Cardiovascular:     Rate and Rhythm: Normal rate and regular rhythm.  Pulmonary:     Effort: Pulmonary effort is normal. No respiratory distress.     Breath sounds: Normal breath sounds.  Abdominal:     General: Bowel sounds are normal.     Palpations: Abdomen is soft.     Tenderness:  There is no abdominal tenderness.  Musculoskeletal:        General: Normal range of motion.     Cervical back: Normal range of motion and neck supple.  Lymphadenopathy:     Cervical: No cervical adenopathy.  Skin:    General: Skin is warm and dry.  Neurological:     Mental Status: He is alert and oriented to person, place, and time.  Psychiatric:        Behavior: Behavior normal.        Thought Content: Thought content normal.        Judgment: Judgment normal.    BP 112/78   Pulse 77   Temp 98 F (36.7 C) (Temporal)   Resp 20   Ht _0  (1.778 m)   Wt 213 lb (96.6 kg)   SpO2 98%   BMI 30.56 kg/m   Hgba1c 6.2%       Assessment & Plan:   ZARIN KNUPP comes in today with chief complaint of Medical Management of Chronic Issues   Diagnosis and orders addressed:  1. Primary hypertension Low sodium diet - CBC with Differential/Platelet - CMP14+EGFR - olmesartan-hydrochlorothiazide (BENICAR HCT) 40-25 MG tablet; TAKE 1 TABLET BY MOUTH EACH DAY.  Needs to be seen for further refills.  Dispense: 90 tablet; Refill: 1  2. Type 2 diabetes mellitus with diabetic polyneuropathy, without long-term current use of insulin (HCC) Continue to watch carbs in diet - Bayer DCA Hb A1c Waived - dapagliflozin propanediol (FARXIGA) 10 MG TABS tablet; Take 1 tablet (10 mg total) by mouth daily. TAKE 1 TABLET (10 MG TOTAL) BY MOUTH DAILY BEFORE BREAKFAST.  Dispense: 90 tablet; Refill: 1 - Semaglutide,0.25 or 0.5MG/DOS, (OZEMPIC, 0.25 OR 0.5 MG/DOSE,) 2 MG/1.5ML SOPN; 0.059m weekly for 2 weeks then 0.556mweekly  Dx:  E11.9  Dispense: 3 mL; Refill: 2 - metFORMIN (GLUCOPHAGE)  1000 MG tablet; TAKE 1 TABLET BY MOUTH 2 TIMES DAILY WITH A MEAL. (NEEDS TO BE SEEN BEFORE NEXT REFILL)  Dispense: 180 tablet; Refill: 1  3. Hyperlipidemia with target LDL less than 100 Low fat diet - Lipid panel - esomeprazole (NEXIUM) 40 MG capsule; Take 1 capsule (40 mg total) by mouth daily at 12 noon.  Dispense: 90  capsule; Refill: 1 - ezetimibe (ZETIA) 10 MG tablet; Take 1 tablet (10 mg total) by mouth daily.  Dispense: 90 tablet; Refill: 1 - simvastatin (ZOCOR) 40 MG tablet; TAKE 1 TABLET BY MOUTH EVERYDAY AT BEDTIME  Dispense: 90 tablet; Refill: 1  4. Gastroesophageal reflux disease without esophagitis Avoid spicy foods Do not eat 2 hours prior to bedtime  - esomeprazole (NEXIUM) 40 MG capsule; Take 1 capsule (40 mg total) by mouth daily at 12 noon.  Dispense: 90 capsule; Refill: 1 - sertraline (ZOLOFT) 100 MG tablet; 2 po qd  Dispense: 180 tablet; Refill: 1  5. Lumbar stenosis with neurogenic claudication Moist heat rest  6. Neuropathy Continue neurontin Do not go barefoooted  7. BPH with obstruction/lower urinary tract symptoms Keep follow up with urology  8. Recurrent major depressive disorder, in full remission ([place]) Stress management - sertraline (ZOLOFT) 100 MG tablet; 2 po qd  Dispense: 180 tablet; Refill: 1  9. Primary insomnia Bedtime routine - zolpidem (AMBIEN) 10 MG tablet; Take 1 tablet (10 mg total) by mouth at bedtime as needed.  Dispense: 90 tablet; Refill: 1  10. BMI 32.0-32.9,adult Discussed diet and exercise for person with BMI >25 Will recheck weight in 3-6 months  11. Degeneration of lumbar intervertebral disc - diazepam (VALIUM) 5 MG tablet; Take 1-2 tablets (5-10 mg total) by mouth every 6 (six) hours as needed.  Dispense: 180 tablet; Refill: 1 - gabapentin (NEURONTIN) 300 MG capsule; Take 1 capsule (300 mg total) by mouth 2 (two) times daily.  Dispense: 180 capsule; Refill: 1   Labs pending Health Maintenance reviewed Diet and exercise encouraged  Follow up plan: 3 months   [name], FNP

## 2022-05-26 NOTE — Patient Instructions (Signed)
Peripheral Neuropathy Peripheral neuropathy is a type of nerve damage. It affects nerves that carry signals between the spinal cord and the arms, legs, and the rest of the body (peripheral nerves). It does not affect nerves in the spinal cord or brain. In peripheral neuropathy, one nerve or a group of nerves may be damaged. Peripheral neuropathy is a broad category that includes many specific nerve disorders, like diabetic neuropathy, hereditary neuropathy, and carpal tunnel syndrome. What are the causes? This condition may be caused by: Certain diseases, such as: Diabetes. This is the most common cause of peripheral neuropathy. Autoimmune diseases, such as rheumatoid arthritis and systemic lupus erythematosus. Nerve diseases that are passed from parent to child (inherited). Kidney disease. Thyroid disease. Other causes may include: Nerve injury. Pressure or stress on a nerve that lasts a long time. Lack (deficiency) of B vitamins. This can result from alcoholism, poor diet, or a restricted diet. Infections. Some medicines, such as cancer medicines (chemotherapy). Poisonous (toxic) substances, such as lead and mercury. Too little blood flowing to the legs. In some cases, the cause of this condition is not known. What are the signs or symptoms? Symptoms of this condition depend on which of your nerves is damaged. Symptoms in the legs, hands, and arms can include: Loss of feeling (numbness) in the feet, hands, or both. Tingling in the feet, hands, or both. Burning pain. Very sensitive skin. Weakness. Not being able to move a part of the body (paralysis). Clumsiness or poor coordination. Muscle twitching. Loss of balance. Symptoms in other parts of the body can include: Not being able to control your bladder. Feeling dizzy. Sexual problems. How is this diagnosed? Diagnosing and finding the cause of peripheral neuropathy can be difficult. Your health care provider will take your  medical history and do a physical exam. A neurological exam will also be done. This involves checking things that are affected by your brain, spinal cord, and nerves (nervous system). For example, your health care provider will check your reflexes, how you move, and what you can feel. You may have other tests, such as: Blood tests. Electromyogram (EMG) and nerve conduction tests. These tests check nerve function and how well the nerves are controlling the muscles. Imaging tests, such as a CT scan or MRI, to rule out other causes of your symptoms. Removing a small piece of nerve to be examined in a lab (nerve biopsy). Removing and examining a small amount of the fluid that surrounds the brain and spinal cord (lumbar puncture). How is this treated? Treatment for this condition may involve: Treating the underlying cause of the neuropathy, such as diabetes, kidney disease, or vitamin deficiencies. Stopping medicines that can cause neuropathy, such as chemotherapy. Medicine to help relieve pain. Medicines may include: Prescription or over-the-counter pain medicine. Anti-seizure medicine. Antidepressants. Pain-relieving patches that are applied to painful areas of skin. Surgery to relieve pressure on a nerve or to destroy a nerve that is causing pain. Physical therapy to help improve movement and balance. Devices to help you move around (assistive devices). Follow these instructions at home: Medicines Take over-the-counter and prescription medicines only as told by your health care provider. Do not take any other medicines without first asking your health care provider. Ask your health care provider if the medicine prescribed to you requires you to avoid driving or using machinery. Lifestyle  Do not use any products that contain nicotine or tobacco. These products include cigarettes, chewing tobacco, and vaping devices, such as e-cigarettes. Smoking keeps   blood from reaching damaged nerves. If you  need help quitting, ask your health care provider. Avoid or limit alcohol. Too much alcohol can cause a vitamin B deficiency, and vitamin B is needed for healthy nerves. Eat a healthy diet. This includes: Eating foods that are high in fiber, such as beans, whole grains, and fresh fruits and vegetables. Limiting foods that are high in fat and processed sugars, such as fried or sweet foods. General instructions  If you have diabetes, work closely with your health care provider to keep your blood sugar under control. If you have numbness in your feet: Check every day for signs of injury or infection. Watch for redness, warmth, and swelling. Wear padded socks and comfortable shoes. These help protect your feet. Develop a good support system. Living with peripheral neuropathy can be stressful. Consider talking with a mental health specialist or joining a support group. Use assistive devices and attend physical therapy as told by your health care provider. This may include using a walker or a cane. Keep all follow-up visits. This is important. Where to find more information National Institute of Neurological Disorders: www.ninds.nih.gov Contact a health care provider if: You have new signs or symptoms of peripheral neuropathy. You are struggling emotionally from dealing with peripheral neuropathy. Your pain is not well controlled. Get help right away if: You have an injury or infection that is not healing normally. You develop new weakness in an arm or leg. You have fallen or do so frequently. Summary Peripheral neuropathy is when the nerves in the arms or legs are damaged, resulting in numbness, weakness, or pain. There are many causes of peripheral neuropathy, including diabetes, pinched nerves, vitamin deficiencies, autoimmune disease, and hereditary conditions. Diagnosing and finding the cause of peripheral neuropathy can be difficult. Your health care provider will take your medical  history, do a physical exam, and do tests, including blood tests and nerve function tests. Treatment involves treating the underlying cause of the neuropathy and taking medicines to help control pain. Physical therapy and assistive devices may also help. This information is not intended to replace advice given to you by your health care provider. Make sure you discuss any questions you have with your health care provider. Document Revised: 08/04/2021 Document Reviewed: 08/04/2021 Elsevier Patient Education  2023 Elsevier Inc.  

## 2022-05-26 NOTE — Addendum Note (Signed)
Addended by: Rolena Infante on: 05/26/2022 10:51 AM   Modules accepted: Orders

## 2022-05-27 LAB — CBC WITH DIFFERENTIAL/PLATELET
Basophils Absolute: 0.1 10*3/uL (ref 0.0–0.2)
Basos: 1 %
EOS (ABSOLUTE): 0.3 10*3/uL (ref 0.0–0.4)
Eos: 5 %
Hematocrit: 39.2 % (ref 37.5–51.0)
Hemoglobin: 13.5 g/dL (ref 13.0–17.7)
Immature Grans (Abs): 0 10*3/uL (ref 0.0–0.1)
Immature Granulocytes: 1 %
Lymphocytes Absolute: 2.7 10*3/uL (ref 0.7–3.1)
Lymphs: 39 %
MCH: 31.5 pg (ref 26.6–33.0)
MCHC: 34.4 g/dL (ref 31.5–35.7)
MCV: 92 fL (ref 79–97)
Monocytes Absolute: 0.5 10*3/uL (ref 0.1–0.9)
Monocytes: 7 %
Neutrophils Absolute: 3.3 10*3/uL (ref 1.4–7.0)
Neutrophils: 47 %
Platelets: 170 10*3/uL (ref 150–450)
RBC: 4.28 x10E6/uL (ref 4.14–5.80)
RDW: 13.1 % (ref 11.6–15.4)
WBC: 6.8 10*3/uL (ref 3.4–10.8)

## 2022-05-27 LAB — CMP14+EGFR
ALT: 22 IU/L (ref 0–44)
AST: 23 IU/L (ref 0–40)
Albumin/Globulin Ratio: 2.1 (ref 1.2–2.2)
Albumin: 4.8 g/dL (ref 3.8–4.8)
Alkaline Phosphatase: 49 IU/L (ref 44–121)
BUN/Creatinine Ratio: 15 (ref 10–24)
BUN: 21 mg/dL (ref 8–27)
Bilirubin Total: 0.4 mg/dL (ref 0.0–1.2)
CO2: 16 mmol/L — ABNORMAL LOW (ref 20–29)
Calcium: 9.8 mg/dL (ref 8.6–10.2)
Chloride: 101 mmol/L (ref 96–106)
Creatinine, Ser: 1.38 mg/dL — ABNORMAL HIGH (ref 0.76–1.27)
Globulin, Total: 2.3 g/dL (ref 1.5–4.5)
Glucose: 153 mg/dL — ABNORMAL HIGH (ref 70–99)
Potassium: 4.5 mmol/L (ref 3.5–5.2)
Sodium: 138 mmol/L (ref 134–144)
Total Protein: 7.1 g/dL (ref 6.0–8.5)
eGFR: 57 mL/min/{1.73_m2} — ABNORMAL LOW (ref >59)

## 2022-05-27 LAB — LIPID PANEL
Chol/HDL Ratio: 4.6 ratio (ref 0.0–5.0)
Cholesterol, Total: 185 mg/dL (ref 100–199)
HDL: 40 mg/dL (ref >39)
LDL Chol Calc (NIH): 103 mg/dL — ABNORMAL HIGH (ref 0–99)
Triglycerides: 248 mg/dL — ABNORMAL HIGH (ref 0–149)
VLDL Cholesterol Cal: 42 mg/dL — ABNORMAL HIGH (ref 5–40)

## 2022-06-18 ENCOUNTER — Ambulatory Visit: Payer: PPO

## 2022-07-21 ENCOUNTER — Telehealth: Payer: Self-pay | Admitting: Nurse Practitioner

## 2022-07-21 DIAGNOSIS — Z79899 Other long term (current) drug therapy: Secondary | ICD-10-CM

## 2022-07-22 DIAGNOSIS — R1319 Other dysphagia: Secondary | ICD-10-CM | POA: Diagnosis not present

## 2022-07-22 DIAGNOSIS — K219 Gastro-esophageal reflux disease without esophagitis: Secondary | ICD-10-CM | POA: Diagnosis not present

## 2022-07-22 DIAGNOSIS — D1039 Benign neoplasm of other parts of mouth: Secondary | ICD-10-CM | POA: Diagnosis not present

## 2022-07-22 NOTE — Telephone Encounter (Signed)
We can try! If he doesn't bring in over $78k for 2 in the household he will qualify  Send in new CCM referral [id number] and they will get him scheduled

## 2022-07-22 NOTE — Telephone Encounter (Signed)
Do you think you might be able to help this patient with rx assistance?

## 2022-07-22 NOTE — Telephone Encounter (Signed)
Samples of Farxiga left up front for patient pick up. Ozempic not available. Placed referral to CCM for patient assistance per patients request.

## 2022-07-28 ENCOUNTER — Telehealth: Payer: Self-pay

## 2022-07-28 NOTE — Chronic Care Management (AMB) (Signed)
  Chronic Care Management   Outreach Note  07/28/2022 Name: [name] MRN: [id number] DOB: 1961-01-12  [name] is a 61 y.o. year old male who is a primary care patient of Chevis Pretty, [place]. I reached out to [name] by phone today in response to a referral sent by [name]'s primary care provider.  An unsuccessful telephone outreach was attempted today. The patient was referred to the case management team for assistance with care management and care coordination.   Follow Up Plan: A HIPAA compliant phone message was left for the patient providing contact information and requesting a return call.  The care management team will reach out to the patient again over the next 7 days.  If patient returns call to provider office, please advise to call [facility] * at [phone]*  Noreene Larsson, [place], [place] [address] Direct Dial: [phone] Sincere [email or web address]

## 2022-08-02 NOTE — Chronic Care Management (AMB) (Unsigned)
  Chronic Care Management   Outreach Note  08/02/2022 Name: [name] MRN: [id number] DOB: 1961/07/16  [name] is a 61 y.o. year old male who is a primary care patient of Chevis Pretty, [place]. I reached out to [name] by phone today in response to a referral sent by [name] primary care provider.  A second unsuccessful telephone outreach was attempted today. The patient was referred to the case management team for assistance with care management and care coordination.   Follow Up Plan: A HIPAA compliant phone message was left for the patient providing contact information and requesting a return call.  The care management team will reach out to the patient again over the next 7 days.  If patient returns call to provider office, please advise to call [facility] * at [phone]*  Noreene Larsson, [place], [place] [address] Direct Dial: [phone] Mystery [email or web address]

## 2022-08-03 ENCOUNTER — Telehealth: Payer: Self-pay | Admitting: Nurse Practitioner

## 2022-08-03 NOTE — Telephone Encounter (Signed)
Pt has been made aware , states he is coming by the office this afternoon to pick up - I have placed his name on 1 box

## 2022-08-03 NOTE — Chronic Care Management (AMB) (Signed)
  Chronic Care Management   Note  08/03/2022 Name: [name] MRN: [id number] DOB: 1960-12-18  [name] is a 61 y.o. year old male who is a primary care patient of Chevis Pretty, [place]. I reached out to [name] by phone today in response to a referral sent by [name] PCP.  [name] was given information about Chronic Care Management services today including:  CCM service includes personalized support from designated clinical staff supervised by his physician, including individualized plan of care and coordination with other care providers 24/7 contact phone numbers for assistance for urgent and routine care needs. Service will only be billed when office clinical staff spend 20 minutes or more in a month to coordinate care. Only one practitioner may furnish and bill the service in a calendar month. The patient may stop CCM services at any time (effective at the end of the month) by phone call to the office staff. The patient is responsible for co-pay (up to 20% after annual deductible is met) if co-pay is required by the individual health plan.   Patient agreed to services and verbal consent obtained.   Follow up plan: Telephone appointment with care management team member scheduled for:09/21/2022  Noreene Larsson, [place], [place] [address] Direct Dial: [phone] [email or web address]

## 2022-08-03 NOTE — Telephone Encounter (Signed)
Okay for sample?

## 2022-08-03 NOTE — Telephone Encounter (Signed)
Sure, we can give x1

## 2022-08-26 ENCOUNTER — Ambulatory Visit (INDEPENDENT_AMBULATORY_CARE_PROVIDER_SITE_OTHER): Payer: PPO | Admitting: Nurse Practitioner

## 2022-08-26 ENCOUNTER — Encounter: Payer: Self-pay | Admitting: Nurse Practitioner

## 2022-08-26 VITALS — BP 102/70 | HR 71 | Temp 97.6°F | Resp 20 | Ht 70.0 in | Wt 212.0 lb

## 2022-08-26 DIAGNOSIS — G629 Polyneuropathy, unspecified: Secondary | ICD-10-CM

## 2022-08-26 DIAGNOSIS — M48062 Spinal stenosis, lumbar region with neurogenic claudication: Secondary | ICD-10-CM | POA: Diagnosis not present

## 2022-08-26 DIAGNOSIS — I10 Essential (primary) hypertension: Secondary | ICD-10-CM | POA: Diagnosis not present

## 2022-08-26 DIAGNOSIS — F3342 Major depressive disorder, recurrent, in full remission: Secondary | ICD-10-CM | POA: Diagnosis not present

## 2022-08-26 DIAGNOSIS — K219 Gastro-esophageal reflux disease without esophagitis: Secondary | ICD-10-CM | POA: Diagnosis not present

## 2022-08-26 DIAGNOSIS — N401 Enlarged prostate with lower urinary tract symptoms: Secondary | ICD-10-CM

## 2022-08-26 DIAGNOSIS — Z23 Encounter for immunization: Secondary | ICD-10-CM | POA: Diagnosis not present

## 2022-08-26 DIAGNOSIS — E785 Hyperlipidemia, unspecified: Secondary | ICD-10-CM

## 2022-08-26 DIAGNOSIS — N138 Other obstructive and reflux uropathy: Secondary | ICD-10-CM

## 2022-08-26 DIAGNOSIS — E1142 Type 2 diabetes mellitus with diabetic polyneuropathy: Secondary | ICD-10-CM

## 2022-08-26 DIAGNOSIS — F5101 Primary insomnia: Secondary | ICD-10-CM | POA: Diagnosis not present

## 2022-08-26 DIAGNOSIS — Z6832 Body mass index (BMI) 32.0-32.9, adult: Secondary | ICD-10-CM

## 2022-08-26 LAB — CMP14+EGFR
ALT: 28 IU/L (ref 0–44)
AST: 28 IU/L (ref 0–40)
Albumin/Globulin Ratio: 2.3 — ABNORMAL HIGH (ref 1.2–2.2)
Albumin: 5 g/dL — ABNORMAL HIGH (ref 3.9–4.9)
Alkaline Phosphatase: 46 IU/L (ref 44–121)
BUN/Creatinine Ratio: 30 — ABNORMAL HIGH (ref 10–24)
BUN: 46 mg/dL — ABNORMAL HIGH (ref 8–27)
Bilirubin Total: 0.5 mg/dL (ref 0.0–1.2)
CO2: 18 mmol/L — ABNORMAL LOW (ref 20–29)
Calcium: 10 mg/dL (ref 8.6–10.2)
Chloride: 104 mmol/L (ref 96–106)
Creatinine, Ser: 1.54 mg/dL — ABNORMAL HIGH (ref 0.76–1.27)
Globulin, Total: 2.2 g/dL (ref 1.5–4.5)
Glucose: 117 mg/dL — ABNORMAL HIGH (ref 70–99)
Potassium: 4.7 mmol/L (ref 3.5–5.2)
Sodium: 140 mmol/L (ref 134–144)
Total Protein: 7.2 g/dL (ref 6.0–8.5)
eGFR: 50 mL/min/{1.73_m2} — ABNORMAL LOW (ref >59)

## 2022-08-26 LAB — CBC WITH DIFFERENTIAL/PLATELET
Basophils Absolute: 0.1 10*3/uL (ref 0.0–0.2)
Basos: 1 %
EOS (ABSOLUTE): 0.3 10*3/uL (ref 0.0–0.4)
Eos: 4 %
Hematocrit: 39.5 % (ref 37.5–51.0)
Hemoglobin: 13.4 g/dL (ref 13.0–17.7)
Immature Grans (Abs): 0 10*3/uL (ref 0.0–0.1)
Immature Granulocytes: 0 %
Lymphocytes Absolute: 3 10*3/uL (ref 0.7–3.1)
Lymphs: 47 %
MCH: 31.3 pg (ref 26.6–33.0)
MCHC: 33.9 g/dL (ref 31.5–35.7)
MCV: 92 fL (ref 79–97)
Monocytes Absolute: 0.4 10*3/uL (ref 0.1–0.9)
Monocytes: 6 %
Neutrophils Absolute: 2.7 10*3/uL (ref 1.4–7.0)
Neutrophils: 42 %
Platelets: 161 10*3/uL (ref 150–450)
RBC: 4.28 x10E6/uL (ref 4.14–5.80)
RDW: 13.6 % (ref 11.6–15.4)
WBC: 6.5 10*3/uL (ref 3.4–10.8)

## 2022-08-26 LAB — LIPID PANEL
Chol/HDL Ratio: 3.3 ratio (ref 0.0–5.0)
Cholesterol, Total: 130 mg/dL (ref 100–199)
HDL: 39 mg/dL — ABNORMAL LOW (ref >39)
LDL Chol Calc (NIH): 56 mg/dL (ref 0–99)
Triglycerides: 216 mg/dL — ABNORMAL HIGH (ref 0–149)
VLDL Cholesterol Cal: 35 mg/dL (ref 5–40)

## 2022-08-26 LAB — BAYER DCA HB A1C WAIVED: HB A1C (BAYER DCA - WAIVED): 6.3 % — ABNORMAL HIGH (ref 4.8–5.6)

## 2022-08-26 MED ORDER — OZEMPIC (0.25 OR 0.5 MG/DOSE) 2 MG/1.5ML ~~LOC~~ SOPN: PEN_INJECTOR | SUBCUTANEOUS | 2 refills | Status: DC

## 2022-08-26 NOTE — Progress Notes (Signed)
Subjective:    Patient ID: DEION SWIFT, male    DOB: [birth year], 61 y.o.   MRN: [id number]   Chief Complaint: medical management of chronic issues     HPI:  JAEDEN MESSER is a 61 y.o. who identifies as a male who was assigned male at birth.   Social history: Lives with: by hisself Work history: disability   Comes in today for follow up of the following chronic medical issues:  1. Primary hypertension No c/o chest pain, sob or headache. Does not check blood pressure at home. BP Readings from Last 3 Encounters:  05/26/22 112/78  02/18/22 112/75  11/10/21 103/73     2. Hyperlipidemia with target LDL less than 100 Does try to wtahc diet. Stays very active. Does try to walk several times a week. Lab Results  Component Value Date   CHOL 185 05/26/2022   HDL 40 05/26/2022   LDLCALC 103 (H) 05/26/2022   TRIG 248 (H) 05/26/2022   CHOLHDL 4.6 05/26/2022   The 10-year ASCVD risk score (Arnett DK, et al., 2019) is: 17.3%   3. Type 2 diabetes mellitus with diabetic polyneuropathy, without long-term current use of insulin (HCC) Fasting blood sugars are running around 70-1120. nO  low blood sugars Lab Results  Component Value Date   HGBA1C 6.2 (H) 05/26/2022     4. Neuropathy Burning and tingling of bil feet. No foot lesions  5. Gastroesophageal reflux disease without esophagitis Is on nexium daily and is doing well.  6. BPH with obstruction/lower urinary tract symptoms No voiding issues . Has not seen urology  lately  7. Recurrent major depressive disorder, in full remission ([facility]) Has been on zoloft fo many years. Is doing well.    08/26/2022    9:22 AM 05/26/2022    8:31 AM 05/26/2022    8:21 AM  Depression screen PHQ 2/9  Decreased Interest 1 0 1  Down, Depressed, Hopeless 1 0 1  PHQ - 2 Score 2 0 2  Altered sleeping 0 0 0  Tired, decreased energy 1 0 1  Change in appetite 1 0 1  Feeling bad or failure about yourself  0 0 0  Trouble concentrating 0 0 1   Moving slowly or fidgety/restless 0 0 0  Suicidal thoughts 0 0 0  PHQ-9 Score 4 0 5  Difficult doing work/chores Not difficult at all Not difficult at all Not difficult at all     8. Primary insomnia Is on ambien nightly to sleep. Sleeps about 8 hours a night  9. Lumbar stenosis Takes valium as needed.  10 BMI 32.0-32.9,adult No recent weight changes Wt Readings from Last 3 Encounters:  08/26/22 212 lb (96.2 kg)  05/26/22 213 lb (96.6 kg)  02/18/22 212 lb (96.2 kg)   BMI Readings from Last 3 Encounters:  08/26/22 30.42 kg/m  05/26/22 30.56 kg/m  02/18/22 30.42 kg/m      New complaints: None today  Allergies  Allergen Reactions   Acetaminophen    Codeine     anxiety   Lipitor [Atorvastatin]     Myalgia   Outpatient Encounter Medications as of 08/26/2022  Medication Sig   cephALEXin (KEFLEX) 500 MG capsule Take by mouth.   Continuous Blood Gluc Receiver (FREESTYLE LIBRE 2 READER) DEVI 1 each by Does not apply route every 14 (fourteen) days.   Continuous Blood Gluc Sensor (FREESTYLE LIBRE 2 SENSOR) MISC USE TO CHECK SUGAR DX: E11.9   cyclobenzaprine (FLEXERIL) 10 MG tablet Take  10 mg by mouth 3 (three) times daily.   dapagliflozin propanediol (FARXIGA) 10 MG TABS tablet Take 1 tablet (10 mg total) by mouth daily. TAKE 1 TABLET (10 MG TOTAL) BY MOUTH DAILY BEFORE BREAKFAST.   diazepam (VALIUM) 5 MG tablet Take 1-2 tablets (5-10 mg total) by mouth every 6 (six) hours as needed.   esomeprazole (NEXIUM) 40 MG capsule Take 1 capsule (40 mg total) by mouth daily at 12 noon.   ezetimibe (ZETIA) 10 MG tablet Take 1 tablet (10 mg total) by mouth daily.   fluticasone (FLONASE) 50 MCG/ACT nasal spray USE 1 SPRAY IN EACH NOSTRIL ONCE DAILY   gabapentin (NEURONTIN) 300 MG capsule Take 1 capsule (300 mg total) by mouth 2 (two) times daily.   glucose blood (ACCU-CHEK GUIDE) test strip Check BS up to 4 times daily Dx e11.9   hydrocortisone (ANUSOL-HC) 2.5 % rectal cream Place 1  application rectally 2 (two) times daily.   loratadine (CLARITIN) 10 MG tablet TAKE ONE (1) TABLET EACH DAY   meloxicam (MOBIC) 15 MG tablet TAKE 1 TABLET BY MOUTH EVERY DAY WITH FOOD   metFORMIN (GLUCOPHAGE) 1000 MG tablet TAKE 1 TABLET BY MOUTH 2 TIMES DAILY WITH A MEAL. (NEEDS TO BE SEEN BEFORE NEXT REFILL)   Multiple Vitamin (MULTIVITAMIN WITH MINERALS) TABS Take 1 tablet by mouth daily.   olmesartan-hydrochlorothiazide (BENICAR HCT) 40-25 MG tablet TAKE 1 TABLET BY MOUTH EACH DAY.  Needs to be seen for further refills.   olopatadine (PATANOL) 0.1 % ophthalmic solution PLACE 1 DROP IN EACH EYE TWICE DAILY   ONETOUCH DELICA LANCETS FINE MISC TEST UP TO 4 TIMES DIALY   Semaglutide,0.25 or 0.5MG/DOS, (OZEMPIC, 0.25 OR 0.5 MG/DOSE,) 2 MG/1.5ML SOPN 0.043m weekly for 2 weeks then 0.525mweekly  Dx:  E11.9   sertraline (ZOLOFT) 100 MG tablet 2 po qd   simvastatin (ZOCOR) 40 MG tablet TAKE 1 TABLET BY MOUTH EVERYDAY AT BEDTIME   tadalafil (CIALIS) 5 MG tablet TAKE 1 TABLET BY MOUTH DAILY AS NEEDED FOR ERECTILE DYSFUNCTION.   zolpidem (AMBIEN) 10 MG tablet Take 1 tablet (10 mg total) by mouth at bedtime as needed.   No facility-administered encounter medications on file as of 08/26/2022.    Past Surgical History:  Procedure Laterality Date   BACK SURGERY  [id number] lumb fusion   COLONOSCOPY     KNEE ARTHROSCOPY WITH MEDIAL MENISECTOMY Right 09/26/2013   Procedure: RIGHT KNEE ARTHROSCOPY WITH PARTIAL MEDIAL and lateral chrondroplasty MENISECTOMY;  Surgeon: RoLorn JunesMD;  [facility];  Laterality: Right;   left elbow surgery     LITHOTRIPSY     NASAL SEPTUM SURGERY     removal of kidney stones  1981   lt open removal   SHOULDER ARTHROSCOPY WITH ROTATOR CUFF REPAIR AND SUBACROMIAL DECOMPRESSION Left 09/26/2013   Procedure: LEFT SHOULDER ARTHROSCOPY WITH DEBRIDEMENT, PARTIAL ROTATOR CUFF REPAIR AND SUBACROMIAL DECOMPRESSION AND SAD ACD  DISTAL CLAVICULECTOMY;  Surgeon: RoLorn JunesMD;  [facility];  Laterality: Left;   TONSILLECTOMY      Family History  Problem Relation Age of Onset   COPD Mother    Hypertension Father    Alzheimer's disease Father    Diabetes Maternal Grandmother    Kidney disease Neg Hx    Prostate cancer Neg Hx       Controlled substance contract: 07/09/21     Review of Systems  Constitutional:  Negative for diaphoresis.  Eyes:  Negative for pain.  Respiratory:  Negative for shortness of breath.   Cardiovascular:  Negative for chest pain, palpitations and leg swelling.  Gastrointestinal:  Negative for abdominal pain.  Endocrine: Negative for polydipsia.  Skin:  Negative for rash.  Neurological:  Negative for dizziness, weakness and headaches.  Hematological:  Does not bruise/bleed easily.  All other systems reviewed and are negative.      Objective:   Physical Exam Vitals and nursing note reviewed.  Constitutional:      Appearance: Normal appearance. He is well-developed.  HENT:     Head: Normocephalic.     Nose: Nose normal.     Mouth/Throat:     Mouth: Mucous membranes are moist.     Pharynx: Oropharynx is clear.  Eyes:     Pupils: Pupils are equal, round, and reactive to light.  Neck:     Thyroid: No thyroid mass or thyromegaly.     Vascular: No carotid bruit or JVD.     Trachea: Phonation normal.  Cardiovascular:     Rate and Rhythm: Normal rate and regular rhythm.  Pulmonary:     Effort: Pulmonary effort is normal. No respiratory distress.     Breath sounds: Normal breath sounds.  Abdominal:     General: Bowel sounds are normal.     Palpations: Abdomen is soft.     Tenderness: There is no abdominal tenderness.  Musculoskeletal:        General: Normal range of motion.     Cervical back: Normal range of motion and neck supple.  Lymphadenopathy:     Cervical: No cervical adenopathy.  Skin:    General: Skin is  warm and dry.  Neurological:     Mental Status: He is alert and oriented to person, place, and time.  Psychiatric:        Behavior: Behavior normal.        Thought Content: Thought content normal.        Judgment: Judgment normal.    BP 102/70   Pulse 71   Temp 97.6 F (36.4 C) (Temporal)   Resp 20   Ht '5\' 10"'  (1.778 m)   Wt 212 lb (96.2 kg)   SpO2 97%   BMI 30.42 kg/m   HGBA1c 6.3%       Assessment & Plan:   ROWYN SPILDE comes in today with chief complaint of Medical Management of Chronic Issues   Diagnosis and orders addressed:  1. Primary hypertension Low sodium diet - CBC with Differential/Platelet - CMP14+EGFR  2. Hyperlipidemia with target LDL less than 100 Low fat diet - Lipid panel  3. Type 2 diabetes mellitus with diabetic polyneuropathy, without long-term current use of insulin (HCC) Continue to watch  carbs in diet - Bayer DCA Hb A1c Waived - Semaglutide,0.25 or 0.5MG/DOS, (OZEMPIC, 0.25 OR 0.5 MG/DOSE,) 2 MG/1.5ML SOPN; 0.011m weekly for 2 weeks then 0.554mweekly  Dx:  E11.9  Dispense: 3 mL; Refill: 2  4. Neuropathy Check feet daily Do not go barefooted  5. Gastroesophageal reflux disease without esophagitis Avoid spicy foods Do not eat 2 hours prior to bedtime  6. BPH with obstruction/lower urinary tract symptoms Report any voiding issues  7. Recurrent major depressive disorder, in full remission ([place] zoloft Stress management  8. Primary insomnia Continue ambien as prescribed  9. BMI 32.0-32.9,adult Discussed diet and exercise for person with BMI >25 Will recheck weight in 3-6 months   10. Lumbar stenosis  with neurogenic claudication - ToxASSURE Select 13 (MW), Urine   Labs pending Health Maintenance reviewed Diet and exercise encouraged  Follow up plan: 3 months   [name], FNP

## 2022-08-26 NOTE — Patient Instructions (Signed)

## 2022-08-31 ENCOUNTER — Telehealth: Payer: Self-pay | Admitting: Nurse Practitioner

## 2022-08-31 LAB — TOXASSURE SELECT 13 (MW), URINE

## 2022-09-01 ENCOUNTER — Telehealth: Payer: Self-pay

## 2022-09-01 NOTE — Telephone Encounter (Signed)
Received notification from [place] regarding approval for [place]. Patient assistance approved from 08/26/22 to 11/11/22.  MEDICATION WILL SHIP TO OFFICE  Phone: [phone]

## 2022-09-08 ENCOUNTER — Telehealth: Payer: Self-pay | Admitting: Pharmacist

## 2022-09-08 NOTE — Telephone Encounter (Signed)
Ozempic patient assistance supply ready for pick up #4 boxes Patient notified

## 2022-09-21 ENCOUNTER — Ambulatory Visit: Payer: PPO | Admitting: Pharmacist

## 2022-09-21 DIAGNOSIS — D1039 Benign neoplasm of other parts of mouth: Secondary | ICD-10-CM | POA: Diagnosis not present

## 2022-09-21 DIAGNOSIS — E785 Hyperlipidemia, unspecified: Secondary | ICD-10-CM

## 2022-09-21 DIAGNOSIS — E1142 Type 2 diabetes mellitus with diabetic polyneuropathy: Secondary | ICD-10-CM

## 2022-09-21 DIAGNOSIS — K219 Gastro-esophageal reflux disease without esophagitis: Secondary | ICD-10-CM | POA: Diagnosis not present

## 2022-09-27 ENCOUNTER — Other Ambulatory Visit: Payer: Self-pay | Admitting: Nurse Practitioner

## 2022-09-27 DIAGNOSIS — F5101 Primary insomnia: Secondary | ICD-10-CM

## 2022-10-04 ENCOUNTER — Telehealth: Payer: Self-pay | Admitting: Nurse Practitioner

## 2022-10-04 NOTE — Telephone Encounter (Signed)
Left message for patient to call back and schedule Medicare Annual Wellness Visit (AWV) to be completed by video or phone.   Last AWV: 06/17/2021  Please schedule at anytime with [facility] Nurse Health Advisor     45 minute appointment  Any questions, please contact me at [phone]      

## 2022-10-05 NOTE — Progress Notes (Signed)
  Care Management   Follow Up Note   09/21/2022 Name: [name] MRN: [id number] DOB: 12/20/1960   Referred by: Chevis Pretty, FNP Reason for referral : DIABETES   An unsuccessful telephone outreach was attempted today. The patient was referred to the case management team for assistance with care management and care coordination.   Follow Up Plan: The care management team will reach out to the patient again over the next 7-10 business days.      Regina Eck, PharmD, BCPS Clinical Pharmacist, [facility]  II Phone [phone]

## 2022-10-06 ENCOUNTER — Telehealth: Payer: Self-pay | Admitting: Nurse Practitioner

## 2022-10-06 NOTE — Telephone Encounter (Signed)
Patient asked that someone contact him about his medication.  He has a question

## 2022-10-12 NOTE — Telephone Encounter (Signed)
Patient wanted to review lab results from last month. Patient informed and verbalized understanding. He also states that he has been approved for assistance for Wilder Glade but hasn't received a shipment yet. Samples given and left up front for patient pick up.

## 2022-10-14 ENCOUNTER — Telehealth: Payer: Self-pay

## 2022-10-14 NOTE — Telephone Encounter (Signed)
Mailed Novo Nordisk renewal application to patient home.   Victory Dresden L. CPhT Rx Patient Advocate  

## 2022-10-25 ENCOUNTER — Ambulatory Visit (INDEPENDENT_AMBULATORY_CARE_PROVIDER_SITE_OTHER): Payer: PPO

## 2022-10-25 VITALS — Ht 70.0 in | Wt 207.0 lb

## 2022-10-25 DIAGNOSIS — Z0000 Encounter for general adult medical examination without abnormal findings: Secondary | ICD-10-CM | POA: Diagnosis not present

## 2022-10-25 NOTE — Progress Notes (Signed)
Subjective:   [name] is a 61 y.o. male who presents for Medicare Annual/Subsequent preventive examination.   I connected with  [name] on 10/25/22 by a audio enabled telemedicine application and verified that I am speaking with the correct person using two identifiers.  Patient Location: Home  Provider Location: Home Office  I discussed the limitations of evaluation and management by telemedicine. The patient expressed understanding and agreed to proceed.  Review of Systems     Cardiac Risk Factors include: advanced age (>34mn, >>43women);diabetes mellitus;male gender;dyslipidemia;hypertension     Objective:    Today's Vitals   10/25/22 1317  Weight: 207 lb (93.9 kg)  Height: '5\' 10"'$  (1.778 m)   Body mass index is 29.7 kg/m.     10/25/2022    1:24 PM 06/17/2021    1:48 PM 10/28/2017    9:20 AM 01/19/2017   11:20 AM 09/26/2013    9:24 AM 09/24/2013   10:21 AM 12/22/2012    3:17 PM  Advanced Directives  Does Patient Have a Medical Advance Directive? No No No No Patient does not have advance directive Patient does not have advance directive;Patient would not like information Patient does not have advance directive;Patient would like information  Would patient like information on creating a medical advance directive? No - Patient declined No - Patient declined  Yes (MAU/Ambulatory/Procedural Areas - Information given)     Pre-existing out of facility DNR order (yellow form or pink MOST form)     No  No    Current Medications (verified) Outpatient Encounter Medications as of 10/25/2022  Medication Sig   Continuous Blood Gluc Receiver (FREESTYLE LIBRE 2 READER) DEVI 1 each by Does not apply route every 14 (fourteen) days.   Continuous Blood Gluc Sensor (FREESTYLE LIBRE 2 SENSOR) MISC USE TO CHECK SUGAR DX: E11.9   cyclobenzaprine (FLEXERIL) 10 MG tablet Take 10 mg by mouth 3 (three) times daily.   dapagliflozin propanediol (FARXIGA) 10 MG TABS tablet Take 1 tablet  (10 mg total) by mouth daily. TAKE 1 TABLET (10 MG TOTAL) BY MOUTH DAILY BEFORE BREAKFAST.   diazepam (VALIUM) 5 MG tablet Take 1-2 tablets (5-10 mg total) by mouth every 6 (six) hours as needed.   esomeprazole (NEXIUM) 40 MG capsule Take 1 capsule (40 mg total) by mouth daily at 12 noon.   ezetimibe (ZETIA) 10 MG tablet Take 1 tablet (10 mg total) by mouth daily.   fluticasone (FLONASE) 50 MCG/ACT nasal spray USE 1 SPRAY IN EACH NOSTRIL ONCE DAILY   gabapentin (NEURONTIN) 300 MG capsule Take 1 capsule (300 mg total) by mouth 2 (two) times daily.   glucose blood (ACCU-CHEK GUIDE) test strip Check BS up to 4 times daily Dx e11.9   hydrocortisone (ANUSOL-HC) 2.5 % rectal cream Place 1 application rectally 2 (two) times daily.   loratadine (CLARITIN) 10 MG tablet TAKE ONE (1) TABLET EACH DAY   meloxicam (MOBIC) 15 MG tablet TAKE 1 TABLET BY MOUTH EVERY DAY WITH FOOD   metFORMIN (GLUCOPHAGE) 1000 MG tablet TAKE 1 TABLET BY MOUTH 2 TIMES DAILY WITH A MEAL. (NEEDS TO BE SEEN BEFORE NEXT REFILL)   Multiple Vitamin (MULTIVITAMIN WITH MINERALS) TABS Take 1 tablet by mouth daily.   olmesartan-hydrochlorothiazide (BENICAR HCT) 40-25 MG tablet TAKE 1 TABLET BY MOUTH EACH DAY.  Needs to be seen for further refills.   olopatadine (PATANOL) 0.1 % ophthalmic solution PLACE 1 DROP IN EACH EYE TWICE DAILY   ONETOUCH DELICA LANCETS FINE  MISC TEST UP TO 4 TIMES DIALY   Semaglutide,0.25 or 0.'5MG'$ /DOS, (OZEMPIC, 0.25 OR 0.5 MG/DOSE,) 2 MG/1.5ML SOPN 0.'025mg'$  weekly for 2 weeks then 0.'5mg'$  weekly  Dx:  E11.9   sertraline (ZOLOFT) 100 MG tablet 2 po qd   simvastatin (ZOCOR) 40 MG tablet TAKE 1 TABLET BY MOUTH EVERYDAY AT BEDTIME   tadalafil (CIALIS) 5 MG tablet TAKE 1 TABLET BY MOUTH DAILY AS NEEDED FOR ERECTILE DYSFUNCTION.   zolpidem (AMBIEN) 10 MG tablet Take 1 tablet (10 mg total) by mouth at bedtime as needed.   No facility-administered encounter medications on file as of 10/25/2022.    Allergies  (verified) Acetaminophen, Codeine, and Lipitor [atorvastatin]   History: Past Medical History:  Diagnosis Date   Arthritis    Chronic back pain    stenosis   Constipation    with pain meds   Depression    takes Zoloft daily   Diabetes mellitus without complication (HCC)    takes Metformni daily   Dizziness    GERD (gastroesophageal reflux disease)    takes Nexium daily   Headache(784.0)    occasionally   Hyperlipidemia    takes Zetia daily   Hypertension    takes Benicar daily   Impingement syndrome of left shoulder    Insomnia    takes Ambien nightly   Joint pain    Left rotator cuff tear    Pneumonia 1986   walking   Seasonal allergies    takes Claritin daily   Tear of medial meniscus of right knee    Urinary frequency    takes Flomax daily   Urinary urgency    Past Surgical History:  Procedure Laterality Date   BACK SURGERY  [id number]   lumb fusion   COLONOSCOPY     KNEE ARTHROSCOPY WITH MEDIAL MENISECTOMY Right 09/26/2013   Procedure: RIGHT KNEE ARTHROSCOPY WITH PARTIAL MEDIAL and lateral chrondroplasty MENISECTOMY;  Surgeon: Lorn Junes, MD;  Location: [facility];  Service: Orthopedics;  Laterality: Right;   left elbow surgery     LITHOTRIPSY     NASAL SEPTUM SURGERY     removal of kidney stones  1981   lt open removal   SHOULDER ARTHROSCOPY WITH ROTATOR CUFF REPAIR AND SUBACROMIAL DECOMPRESSION Left 09/26/2013   Procedure: LEFT SHOULDER ARTHROSCOPY WITH DEBRIDEMENT, PARTIAL ROTATOR CUFF REPAIR AND SUBACROMIAL DECOMPRESSION AND SAD ACD DISTAL CLAVICULECTOMY;  Surgeon: Lorn Junes, MD;  Location: [place] [facility];  Service: Orthopedics;  Laterality: Left;   TONSILLECTOMY     Family History  Problem Relation Age of Onset   COPD Mother    Hypertension Father    Alzheimer's disease Father    Diabetes Maternal Grandmother    Kidney disease Neg Hx    Prostate cancer Neg Hx    Social History   Socioeconomic History    Marital status: Divorced    Spouse name: Not on file   Number of children: Not on file   Years of education: Not on file   Highest education level: Not on file  Occupational History   Occupation: retired  Tobacco Use   Smoking status: Never   Smokeless tobacco: Never  Vaping Use   Vaping Use: Never used  Substance and Sexual Activity   Alcohol use: Yes    Comment: rarely   Drug use: No   Sexual activity: Yes  Other Topics Concern   Not on file  Social History Narrative   Lives alone. Daughter lives nearby  Social Determinants of Health   Financial Resource Strain: Low Risk  (10/25/2022)   Overall Financial Resource Strain (CARDIA)    Difficulty of Paying Living Expenses: Not hard at all  Food Insecurity: No Food Insecurity (10/25/2022)   Hunger Vital Sign    Worried About Running Out of Food in the Last Year: Never true    Ran Out of Food in the Last Year: Never true  Transportation Needs: No Transportation Needs (10/25/2022)   PRAPARE - Hydrologist (Medical): No    Lack of Transportation (Non-Medical): No  Physical Activity: Insufficiently Active (10/25/2022)   Exercise Vital Sign    Days of Exercise per Week: 3 days    Minutes of Exercise per Session: 30 min  Stress: No Stress Concern Present (10/25/2022)   [place]    Feeling of Stress : Not at all  Social Connections: Moderately Integrated (10/25/2022)   Social Connection and Isolation Panel [NHANES]    Frequency of Communication with Friends and Family: More than three times a week    Frequency of Social Gatherings with Friends and Family: More than three times a week    Attends Religious Services: More than 4 times per year    Active Member of [employer] or Organizations: Yes    Attends Music therapist: More than 4 times per year    Marital Status: Divorced    Tobacco Counseling Counseling given: Not  Answered   Clinical Intake:  Pre-visit preparation completed: Yes  Pain : No/denies pain     Nutritional Risks: None Diabetes: No  How often do you need to have someone help you when you read instructions, pamphlets, or other written materials from your doctor or pharmacy?: 1 - Never  Diabetic?yes  Nutrition Risk Assessment:  Has the patient had any N/V/D within the last 2 months?  No  Does the patient have any non-healing wounds?  No  Has the patient had any unintentional weight loss or weight gain?  No   Diabetes:  Is the patient diabetic?  Yes  If diabetic, was a CBG obtained today?  No  Did the patient bring in their glucometer from home?  No  How often do you monitor your CBG's? Libre .   Financial Strains and Diabetes Management:  Are you having any financial strains with the device, your supplies or your medication? No .  Does the patient want to be seen by Chronic Care Management for management of their diabetes?  No  Would the patient like to be referred to a Nutritionist or for Diabetic Management?  No   Diabetic Exams:  Diabetic Eye Exam: Overdue for diabetic eye exam. Pt has been advised about the importance in completing this exam. Patient advised to call and schedule an eye exam. Diabetic Foot Exam: Overdue, Pt has been advised about the importance in completing this exam. Pt is scheduled for diabetic foot exam on next office visit .   Interpreter Needed?: No  Information entered by :: Jadene Pierini, LPN   Activities of Daily Living    10/25/2022    1:23 PM  In your present state of health, do you have any difficulty performing the following activities:  Hearing? 0  Vision? 0  Difficulty concentrating or making decisions? 0  Walking or climbing stairs? 0  Dressing or bathing? 0  Doing errands, shopping? 0  Preparing Food and eating ? N  Using the Toilet? N  In the past six months, have you accidently leaked urine? N  Do you have problems with  loss of bowel control? N  Managing your Medications? N  Managing your Finances? N  Housekeeping or managing your Housekeeping? N    Patient Care Team: Chevis Pretty, FNP as PCP - General (Family Medicine) Lavera Guise, [facility] as Pharmacist (Family Medicine)  Indicate any recent Medical Services you may have received from other than [facility] providers in the past year (date may be approximate).     Assessment:   This is a routine wellness examination for [place].  Hearing/Vision screen Vision Screening - Comments:: Patient to schedule appointment with eye doctor   Dietary issues and exercise activities discussed: Current Exercise Habits: Home exercise routine, Type of exercise: walking, Time (Minutes): 30, Frequency (Times/Week): 3, Weekly Exercise (Minutes/Week): 90, Intensity: Mild, Exercise limited by: None identified   Goals Addressed             This Visit's Progress    Exercise 3x per week (30 min per time)         Depression Screen    10/25/2022    1:22 PM 08/26/2022    9:22 AM 05/26/2022    8:31 AM 05/26/2022    8:21 AM 02/18/2022    8:07 AM 11/10/2021    8:44 AM 08/06/2021    8:52 AM  PHQ 2/9 Scores  PHQ - 2 Score 0 2 0 '2 2 4 1  '$ PHQ- 9 Score 0 4 0 '5 4 10 4    '$ Fall Risk    10/25/2022    1:19 PM 08/26/2022    9:22 AM 05/26/2022    8:31 AM 05/26/2022    8:21 AM 02/18/2022    8:07 AM  Fall Risk   Falls in the past year? 0 0 0 0 0  Number falls in past yr: 0      Injury with Fall? 0      Risk for fall due to : No Fall Risks      Follow up Falls prevention discussed        [place]:  Any stairs in or around the home? No  If so, are there any without handrails? No  Home free of loose throw rugs in walkways, pet beds, electrical cords, etc? Yes  Adequate lighting in your home to reduce risk of falls? Yes   ASSISTIVE DEVICES UTILIZED TO PREVENT FALLS:  Life alert? No  Use of a cane, walker or w/c? No  Grab bars in the  bathroom? No  Shower chair or bench in shower? No  Elevated toilet seat or a handicapped toilet? No        10/25/2022    1:24 PM 06/17/2021    1:49 PM  6CIT Screen  What Year? 0 points 0 points  What month? 0 points 0 points  What time? 0 points 0 points  Count back from 20 0 points 0 points  Months in reverse 0 points 0 points  Repeat phrase 0 points 0 points  Total Score 0 points 0 points    Immunizations Immunization History  Administered Date(s) Administered   Influenza,inj,Quad PF,6+ Mos 10/15/2014, 11/20/2015, 10/11/2016, 10/17/2017, 11/28/2018, 10/24/2019, 10/02/2020, 11/10/2021, 08/26/2022   PFIZER(Purple Top)SARS-COV-2 Vaccination 04/12/2020, 05/03/2020   PNEUMOCOCCAL CONJUGATE-20 11/10/2021   Tdap 07/25/2013   Zoster Recombinat (Shingrix) 02/18/2022, 05/26/2022    TDAP status: Up to date  Flu Vaccine status: Up to date  Pneumococcal vaccine status:  Up to date  Covid-19 vaccine status: Completed vaccines  Qualifies for Shingles Vaccine? Yes   Zostavax completed Yes   Shingrix Completed?: Yes  Screening Tests Health Maintenance  Topic Date Due   OPHTHALMOLOGY EXAM  10/16/2015   COVID-19 Vaccine (3 - Pfizer risk series) 05/31/2020   Diabetic kidney evaluation - Urine ACR  11/10/2022   HIV Screening  11/10/2022 (Originally 03/24/1976)   HEMOGLOBIN A1C  02/24/2023   TETANUS/TDAP  07/26/2023   Diabetic kidney evaluation - GFR measurement  08/27/2023   FOOT EXAM  08/27/2023   Medicare Annual Wellness (AWV)  10/26/2023   Fecal DNA (Cologuard)  12/22/2024   INFLUENZA VACCINE  Completed   Hepatitis C Screening  Completed   Zoster Vaccines- Shingrix  Completed   HPV VACCINES  Aged Out    Health Maintenance  Health Maintenance Due  Topic Date Due   OPHTHALMOLOGY EXAM  10/16/2015   COVID-19 Vaccine (3 - Pfizer risk series) 05/31/2020   Diabetic kidney evaluation - Urine ACR  11/10/2022    Colorectal cancer screening: Type of screening: Cologuard.  Completed 12/22/2021. Repeat every 3 years  Lung Cancer Screening: (Low Dose CT Chest recommended if Age 66-80 years, 30 pack-year currently smoking OR have quit w/in 15years.) does not qualify.   Lung Cancer Screening Referral: n/a  Additional Screening:  Hepatitis C Screening: does not qualify;   Vision Screening: Recommended annual ophthalmology exams for early detection of glaucoma and other disorders of the eye. Is the patient up to date with their annual eye exam?  No  Who is the provider or what is the name of the office in which the patient attends annual eye exams? None patient to obtain eye doctor covered with insurance  If pt is not established with a provider, would they like to be referred to a provider to establish care?  no .   Dental Screening: Recommended annual dental exams for proper oral hygiene  Community Resource Referral / Chronic Care Management: CRR required this visit?  No   CCM required this visit?  No      Plan:     I have personally reviewed and noted the following in the patient's chart:   Medical and social history Use of alcohol, tobacco or illicit drugs  Current medications and supplements including opioid prescriptions. Patient is not currently taking opioid prescriptions. Functional ability and status Nutritional status Physical activity Advanced directives List of other physicians Hospitalizations, surgeries, and ER visits in previous 12 months Vitals Screenings to include cognitive, depression, and falls Referrals and appointments  In addition, I have reviewed and discussed with patient certain preventive protocols, quality metrics, and best practice recommendations. A written personalized care plan for preventive services as well as general preventive health recommendations were provided to patient.     Daphane Shepherd, LPN   [id number]   Nurse Notes: Patient to schedule eye appointment

## 2022-10-25 NOTE — Patient Instructions (Signed)
[name] , Thank you for taking time to come for your Medicare Wellness Visit. I appreciate your ongoing commitment to your health goals. Please review the following plan we discussed and let me know if I can assist you in the future.   These are the goals we discussed:  Goals      Exercise 3x per week (30 min per time)     HEMOGLOBIN A1C < 7        This is a list of the screening recommended for you and due dates:  Health Maintenance  Topic Date Due   Eye exam for diabetics  10/16/2015   COVID-19 Vaccine (3 - Pfizer risk series) 05/31/2020   Yearly kidney health urinalysis for diabetes  11/10/2022   HIV Screening  11/10/2022*   Hemoglobin A1C  02/24/2023   Tetanus Vaccine  07/26/2023   Yearly kidney function blood test for diabetes  08/27/2023   Complete foot exam   08/27/2023   Medicare Annual Wellness Visit  10/26/2023   Cologuard (Stool DNA test)  12/22/2024   Flu Shot  Completed   Hepatitis C Screening: USPSTF Recommendation to screen - Ages 18-79 yo.  Completed   Zoster (Shingles) Vaccine  Completed   HPV Vaccine  Aged Out  *Topic was postponed. The date shown is not the original due date.    Advanced directives: Advance directive discussed with you today. I have provided a copy for you to complete at home and have notarized. Once this is complete please bring a copy in to our office so we can scan it into your chart.   Conditions/risks identified: Aim for 30 minutes of exercise or brisk walking, 6-8 glasses of water, and 5 servings of fruits and vegetables each day.   Next appointment: Follow up in one year for your annual wellness visit.   Preventive Care 60 Years and Older, Male  Preventive care refers to lifestyle choices and visits with your health care provider that can promote health and wellness. What does preventive care include? A yearly physical exam. This is also called an annual well check. Dental exams once or twice a year. Routine eye exams. Ask your  health care provider how often you should have your eyes checked. Personal lifestyle choices, including: Daily care of your teeth and gums. Regular physical activity. Eating a healthy diet. Avoiding tobacco and drug use. Limiting alcohol use. Practicing safe sex. Taking low doses of aspirin every day. Taking vitamin and mineral supplements as recommended by your health care provider. What happens during an annual well check? The services and screenings done by your health care provider during your annual well check will depend on your age, overall health, lifestyle risk factors, and family history of disease. Counseling  Your health care provider may ask you questions about your: Alcohol use. Tobacco use. Drug use. Emotional well-being. Home and relationship well-being. Sexual activity. Eating habits. History of falls. Memory and ability to understand (cognition). Work and work Statistician. Screening  You may have the following tests or measurements: Height, weight, and BMI. Blood pressure. Lipid and cholesterol levels. These may be checked every 5 years, or more frequently if you are over 63 years old. Skin check. Lung cancer screening. You may have this screening every year starting at age 81 if you have a 30-pack-year history of smoking and currently smoke or have quit within the past 15 years. Fecal occult blood test (FOBT) of the stool. You may have this test every year starting at  age 59. Flexible sigmoidoscopy or colonoscopy. You may have a sigmoidoscopy every 5 years or a colonoscopy every 10 years starting at age 50. Prostate cancer screening. Recommendations will vary depending on your family history and other risks. Hepatitis C blood test. Hepatitis B blood test. Sexually transmitted disease (STD) testing. Diabetes screening. This is done by checking your blood sugar (glucose) after you have not eaten for a while (fasting). You may have this done every 1-3  years. Abdominal aortic aneurysm (AAA) screening. You may need this if you are a current or former smoker. Osteoporosis. You may be screened starting at age 83 if you are at high risk. Talk with your health care provider about your test results, treatment options, and if necessary, the need for more tests. Vaccines  Your health care provider may recommend certain vaccines, such as: Influenza vaccine. This is recommended every year. Tetanus, diphtheria, and acellular pertussis (Tdap, Td) vaccine. You may need a Td booster every 10 years. Zoster vaccine. You may need this after age 68. Pneumococcal 13-valent conjugate (PCV13) vaccine. One dose is recommended after age 4. Pneumococcal polysaccharide (PPSV23) vaccine. One dose is recommended after age 57. Talk to your health care provider about which screenings and vaccines you need and how often you need them. This information is not intended to replace advice given to you by your health care provider. Make sure you discuss any questions you have with your health care provider. Document Released: 12/26/2015 Document Revised: 08/18/2016 Document Reviewed: 09/30/2015 Elsevier Interactive Patient Education  2017 [place] Prevention in the Home Falls can cause injuries. They can happen to people of all ages. There are many things you can do to make your home safe and to help prevent falls. What can I do on the outside of my home? Regularly fix the edges of walkways and driveways and fix any cracks. Remove anything that might make you trip as you walk through a door, such as a raised step or threshold. Trim any bushes or trees on the path to your home. Use bright outdoor lighting. Clear any walking paths of anything that might make someone trip, such as rocks or tools. Regularly check to see if handrails are loose or broken. Make sure that both sides of any steps have handrails. Any raised decks and porches should have guardrails on the  edges. Have any leaves, snow, or ice cleared regularly. Use sand or salt on walking paths during winter. Clean up any spills in your garage right away. This includes oil or grease spills. What can I do in the bathroom? Use night lights. Install grab bars by the toilet and in the tub and shower. Do not use towel bars as grab bars. Use non-skid mats or decals in the tub or shower. If you need to sit down in the shower, use a plastic, non-slip stool. Keep the floor dry. Clean up any water that spills on the floor as soon as it happens. Remove soap buildup in the tub or shower regularly. Attach bath mats securely with double-sided non-slip rug tape. Do not have throw rugs and other things on the floor that can make you trip. What can I do in the bedroom? Use night lights. Make sure that you have a light by your bed that is easy to reach. Do not use any sheets or blankets that are too big for your bed. They should not hang down onto the floor. Have a firm chair that has side arms. You can  use this for support while you get dressed. Do not have throw rugs and other things on the floor that can make you trip. What can I do in the kitchen? Clean up any spills right away. Avoid walking on wet floors. Keep items that you use a lot in easy-to-reach places. If you need to reach something above you, use a strong step stool that has a grab bar. Keep electrical cords out of the way. Do not use floor polish or wax that makes floors slippery. If you must use wax, use non-skid floor wax. Do not have throw rugs and other things on the floor that can make you trip. What can I do with my stairs? Do not leave any items on the stairs. Make sure that there are handrails on both sides of the stairs and use them. Fix handrails that are broken or loose. Make sure that handrails are as long as the stairways. Check any carpeting to make sure that it is firmly attached to the stairs. Fix any carpet that is loose or  worn. Avoid having throw rugs at the top or bottom of the stairs. If you do have throw rugs, attach them to the floor with carpet tape. Make sure that you have a light switch at the top of the stairs and the bottom of the stairs. If you do not have them, ask someone to add them for you. What else can I do to help prevent falls? Wear shoes that: Do not have high heels. Have rubber bottoms. Are comfortable and fit you well. Are closed at the toe. Do not wear sandals. If you use a stepladder: Make sure that it is fully opened. Do not climb a closed stepladder. Make sure that both sides of the stepladder are locked into place. Ask someone to hold it for you, if possible. Clearly mark and make sure that you can see: Any grab bars or handrails. First and last steps. Where the edge of each step is. Use tools that help you move around (mobility aids) if they are needed. These include: Canes. Walkers. Scooters. Crutches. Turn on the lights when you go into a dark area. Replace any light bulbs as soon as they burn out. Set up your furniture so you have a clear path. Avoid moving your furniture around. If any of your floors are uneven, fix them. If there are any pets around you, be aware of where they are. Review your medicines with your doctor. Some medicines can make you feel dizzy. This can increase your chance of falling. Ask your doctor what other things that you can do to help prevent falls. This information is not intended to replace advice given to you by your health care provider. Make sure you discuss any questions you have with your health care provider. Document Released: 09/25/2009 Document Revised: 05/06/2016 Document Reviewed: 01/03/2015 Elsevier Interactive Patient Education  2017 [employer].

## 2022-11-16 NOTE — Progress Notes (Signed)
[facility] Quality Team Note  Name: IZREAL KOCK Date of Birth: [birth year] MRN: [id number] Date: 11/16/2022  [facility] Quality Team has reviewed this patient's chart, please see recommendations below:  Diabetic Retinal Eye Exam; Patient requests [facility] Quality Coordinator to schedule Diabetic Retinal Screening at ([place] Event 11/25/2022, 11:00am).

## 2022-11-25 ENCOUNTER — Telehealth: Payer: Self-pay | Admitting: Pharmacist

## 2022-11-25 ENCOUNTER — Encounter: Payer: Self-pay | Admitting: Nurse Practitioner

## 2022-11-25 ENCOUNTER — Ambulatory Visit (INDEPENDENT_AMBULATORY_CARE_PROVIDER_SITE_OTHER): Payer: PPO | Admitting: Nurse Practitioner

## 2022-11-25 VITALS — BP 103/75 | HR 84 | Temp 98.2°F | Resp 20 | Ht 70.0 in | Wt 206.0 lb

## 2022-11-25 DIAGNOSIS — Z6832 Body mass index (BMI) 32.0-32.9, adult: Secondary | ICD-10-CM | POA: Diagnosis not present

## 2022-11-25 DIAGNOSIS — I10 Essential (primary) hypertension: Secondary | ICD-10-CM

## 2022-11-25 DIAGNOSIS — M5442 Lumbago with sciatica, left side: Secondary | ICD-10-CM

## 2022-11-25 DIAGNOSIS — N138 Other obstructive and reflux uropathy: Secondary | ICD-10-CM

## 2022-11-25 DIAGNOSIS — E785 Hyperlipidemia, unspecified: Secondary | ICD-10-CM

## 2022-11-25 DIAGNOSIS — M5136 Other intervertebral disc degeneration, lumbar region: Secondary | ICD-10-CM | POA: Diagnosis not present

## 2022-11-25 DIAGNOSIS — E1142 Type 2 diabetes mellitus with diabetic polyneuropathy: Secondary | ICD-10-CM

## 2022-11-25 DIAGNOSIS — F5101 Primary insomnia: Secondary | ICD-10-CM

## 2022-11-25 DIAGNOSIS — R3911 Hesitancy of micturition: Secondary | ICD-10-CM

## 2022-11-25 DIAGNOSIS — N401 Enlarged prostate with lower urinary tract symptoms: Secondary | ICD-10-CM

## 2022-11-25 DIAGNOSIS — M5441 Lumbago with sciatica, right side: Secondary | ICD-10-CM

## 2022-11-25 DIAGNOSIS — F3342 Major depressive disorder, recurrent, in full remission: Secondary | ICD-10-CM | POA: Diagnosis not present

## 2022-11-25 DIAGNOSIS — G629 Polyneuropathy, unspecified: Secondary | ICD-10-CM

## 2022-11-25 DIAGNOSIS — K219 Gastro-esophageal reflux disease without esophagitis: Secondary | ICD-10-CM

## 2022-11-25 DIAGNOSIS — G8929 Other chronic pain: Secondary | ICD-10-CM

## 2022-11-25 LAB — BAYER DCA HB A1C WAIVED: HB A1C (BAYER DCA - WAIVED): 6.2 % — ABNORMAL HIGH (ref 4.8–5.6)

## 2022-11-25 MED ORDER — METFORMIN HCL 1000 MG PO TABS: ORAL_TABLET | ORAL | 1 refills | Status: DC

## 2022-11-25 MED ORDER — EZETIMIBE 10 MG PO TABS: 10.0000 mg | ORAL_TABLET | Freq: Every day | ORAL | 1 refills | Status: DC

## 2022-11-25 MED ORDER — DAPAGLIFLOZIN PROPANEDIOL 10 MG PO TABS: 10.0000 mg | ORAL_TABLET | Freq: Every day | ORAL | 1 refills | Status: DC

## 2022-11-25 MED ORDER — ZOLPIDEM TARTRATE 10 MG PO TABS: 10.0000 mg | ORAL_TABLET | Freq: Every evening | ORAL | 1 refills | Status: DC | PRN

## 2022-11-25 MED ORDER — DIAZEPAM 5 MG PO TABS: 5.0000 mg | ORAL_TABLET | Freq: Four times a day (QID) | ORAL | 1 refills | Status: DC | PRN

## 2022-11-25 MED ORDER — OZEMPIC (0.25 OR 0.5 MG/DOSE) 2 MG/1.5ML ~~LOC~~ SOPN: PEN_INJECTOR | SUBCUTANEOUS | 2 refills | Status: DC

## 2022-11-25 MED ORDER — SERTRALINE HCL 100 MG PO TABS: ORAL_TABLET | ORAL | 1 refills | Status: DC

## 2022-11-25 MED ORDER — GABAPENTIN 300 MG PO CAPS: 300.0000 mg | ORAL_CAPSULE | Freq: Two times a day (BID) | ORAL | 1 refills | Status: DC

## 2022-11-25 MED ORDER — SIMVASTATIN 40 MG PO TABS: ORAL_TABLET | ORAL | 1 refills | Status: DC

## 2022-11-25 MED ORDER — ESOMEPRAZOLE MAGNESIUM 40 MG PO CPDR: 40.0000 mg | DELAYED_RELEASE_CAPSULE | Freq: Every day | ORAL | 1 refills | Status: DC

## 2022-11-25 MED ORDER — OLMESARTAN MEDOXOMIL-HCTZ 40-25 MG PO TABS: ORAL_TABLET | ORAL | 1 refills | Status: DC

## 2022-11-25 NOTE — Progress Notes (Signed)
Subjective:    Patient ID: [name], male    DOB: 12/20/1960, 61 y.o.   MRN: [id number]   Chief Complaint: Medical Management of Chronic Issues    HPI:  [name] is a 61 y.o. who identifies as a male who was assigned male at birth.   Social history: Lives with: hisself Work history: disability   Comes in today for follow up of the following chronic medical issues:  1. Type 2 diabetes mellitus with diabetic polyneuropathy, without long-term current use of insulin ([name]) Does watch his diet; fasting blood sugars run between 90-110s. No problems with lows. Lab Results  Component Value Date   HGBA1C 6.3 (H) 08/26/2022    2. Hyperlipidemia with target LDL less than 100 Does watch his diet; stays active; walks several times a week most weeks. Lab Results  Component Value Date   CHOL 130 08/26/2022   HDL 39 (L) 08/26/2022   LDLCALC 56 08/26/2022   TRIG 216 (H) 08/26/2022   CHOLHDL 3.3 08/26/2022   The 10-year ASCVD risk score (Arnett DK, et al., 2019) is: 13.7%  3. Primary hypertension No c/o chest pain, sob, or headache. Does not check BP at home. BP Readings from Last 3 Encounters:  11/25/22 103/75  08/26/22 102/70  05/26/22 112/78    4. Benign prostatic hyperplasia with urinary hesitancy 5 BPH with obstruction/lower urinary tract symptoms No voiding issues; has appointment with urology in 2 weeks.  6. Gastroesophageal reflux disease without esophagitis Doing well on daily nexium.  7. Neuropathy Does have burning and tingling in bilat feet, no worse, no lesions. gabapentin does help.   8. Recurrent major depressive disorder, in full remission ([name]) Doing well on zoloft; has a hard time during the holidays reflecting on loss of parents, but states this year has been better than the last few. Spending a lot of time with grandkids.    11/25/2022   11:02 AM 08/26/2022    9:23 AM 05/26/2022    8:32 AM 05/26/2022    8:21 AM  GAD 7 : Generalized Anxiety  Score  Nervous, Anxious, on Edge 0 0 0 0  Control/stop worrying 0 0 0 0  Worry too much - different things 0 0 0 0  Trouble relaxing 0 0 0 0  Restless 0 0 0 0  Easily annoyed or irritable 0 0 0 0  Afraid - awful might happen 0 0 0 0  Total GAD 7 Score 0 0 0 0  Anxiety Difficulty Not difficult at all Not difficult at all Not difficult at all Not difficult at all       11/25/2022   11:02 AM 10/25/2022    1:22 PM 08/26/2022    9:22 AM 05/26/2022    8:31 AM 05/26/2022    8:21 AM  Depression screen PHQ 2/9  Decreased Interest 0 0 1 0 1  Down, Depressed, Hopeless 0 0 1 0 1  PHQ - 2 Score 0 0 2 0 2  Altered sleeping 0 0 0 0 0  Tired, decreased energy 0 0 1 0 1  Change in appetite 0 0 1 0 1  Feeling bad or failure about yourself  0 0 0 0 0  Trouble concentrating 0 0 0 0 1  Moving slowly or fidgety/restless 0 0 0 0 0  Suicidal thoughts 0 0 0 0 0  PHQ-9 Score 0 0 4 0 5  Difficult doing work/chores Not difficult at all Not difficult at all Not difficult  at all Not difficult at all Not difficult at all     9. Primary insomnia Takes ambien every night; sleeps between 6-8 hours a night.  10. Chronic bilateral low back pain with bilateral sciatica Rarely has to used meds; bought some insoles for his shoes and that has helped a lot.  11. BMI 32.0-32.9,adult Weight is down 6lb since September. Wt Readings from Last 3 Encounters:  11/25/22 206 lb (93.4 kg)  10/25/22 207 lb (93.9 kg)  08/26/22 212 lb (96.2 kg)   BMI Readings from Last 3 Encounters:  11/25/22 29.56 kg/m  10/25/22 29.70 kg/m  08/26/22 30.42 kg/m     New complaints: none  Allergies  Allergen Reactions   Acetaminophen    Codeine     anxiety   Lipitor [Atorvastatin]     Myalgia   Outpatient Encounter Medications as of 11/25/2022  Medication Sig   Continuous Blood Gluc Receiver (FREESTYLE LIBRE 2 READER) DEVI 1 each by Does not apply route every 14 (fourteen) days.   Continuous Blood Gluc Sensor (FREESTYLE  LIBRE 2 SENSOR) MISC USE TO CHECK SUGAR DX: E11.9   cyclobenzaprine (FLEXERIL) 10 MG tablet Take 10 mg by mouth 3 (three) times daily.   dapagliflozin propanediol (FARXIGA) 10 MG TABS tablet Take 1 tablet (10 mg total) by mouth daily. TAKE 1 TABLET (10 MG TOTAL) BY MOUTH DAILY BEFORE BREAKFAST.   diazepam (VALIUM) 5 MG tablet Take 1-2 tablets (5-10 mg total) by mouth every 6 (six) hours as needed.   esomeprazole (NEXIUM) 40 MG capsule Take 1 capsule (40 mg total) by mouth daily at 12 noon.   ezetimibe (ZETIA) 10 MG tablet Take 1 tablet (10 mg total) by mouth daily.   fluticasone (FLONASE) 50 MCG/ACT nasal spray USE 1 SPRAY IN EACH NOSTRIL ONCE DAILY   gabapentin (NEURONTIN) 300 MG capsule Take 1 capsule (300 mg total) by mouth 2 (two) times daily.   glucose blood (ACCU-CHEK GUIDE) test strip Check BS up to 4 times daily Dx e11.9   hydrocortisone (ANUSOL-HC) 2.5 % rectal cream Place 1 application rectally 2 (two) times daily.   loratadine (CLARITIN) 10 MG tablet TAKE ONE (1) TABLET EACH DAY   meloxicam (MOBIC) 15 MG tablet TAKE 1 TABLET BY MOUTH EVERY DAY WITH FOOD   metFORMIN (GLUCOPHAGE) 1000 MG tablet TAKE 1 TABLET BY MOUTH 2 TIMES DAILY WITH A MEAL. (NEEDS TO BE SEEN BEFORE NEXT REFILL)   Multiple Vitamin (MULTIVITAMIN WITH MINERALS) TABS Take 1 tablet by mouth daily.   olmesartan-hydrochlorothiazide (BENICAR HCT) 40-25 MG tablet TAKE 1 TABLET BY MOUTH EACH DAY.  Needs to be seen for further refills.   olopatadine (PATANOL) 0.1 % ophthalmic solution PLACE 1 DROP IN EACH EYE TWICE DAILY   ONETOUCH DELICA LANCETS FINE MISC TEST UP TO 4 TIMES DIALY   Semaglutide,0.25 or 0.5MG/DOS, (OZEMPIC, 0.25 OR 0.5 MG/DOSE,) 2 MG/1.5ML SOPN 0.0100m weekly for 2 weeks then 0.555mweekly  Dx:  E11.9   sertraline (ZOLOFT) 100 MG tablet 2 po qd   simvastatin (ZOCOR) 40 MG tablet TAKE 1 TABLET BY MOUTH EVERYDAY AT BEDTIME   tadalafil (CIALIS) 5 MG tablet TAKE 1 TABLET BY MOUTH DAILY AS NEEDED FOR ERECTILE  DYSFUNCTION.   zolpidem (AMBIEN) 10 MG tablet Take 1 tablet (10 mg total) by mouth at bedtime as needed.   No facility-administered encounter medications on file as of 11/25/2022.    Past Surgical History:  Procedure Laterality Date   BACK SURGERY  [id number] lumb fusion  COLONOSCOPY     KNEE ARTHROSCOPY WITH MEDIAL MENISECTOMY Right 09/26/2013   Procedure: RIGHT KNEE ARTHROSCOPY WITH PARTIAL MEDIAL and lateral chrondroplasty MENISECTOMY;  Surgeon: Lorn Junes, MD;  Location: [facility];  Service: Orthopedics;  Laterality: Right;   left elbow surgery     LITHOTRIPSY     NASAL SEPTUM SURGERY     removal of kidney stones  1981   lt open removal   SHOULDER ARTHROSCOPY WITH ROTATOR CUFF REPAIR AND SUBACROMIAL DECOMPRESSION Left 09/26/2013   Procedure: LEFT SHOULDER ARTHROSCOPY WITH DEBRIDEMENT, PARTIAL ROTATOR CUFF REPAIR AND SUBACROMIAL DECOMPRESSION AND SAD ACD DISTAL CLAVICULECTOMY;  Surgeon: Lorn Junes, MD;  Location: [facility];  Service: Orthopedics;  Laterality: Left;   TONSILLECTOMY      Family History  Problem Relation Age of Onset   COPD Mother    Hypertension Father    Alzheimer's disease Father    Diabetes Maternal Grandmother    Kidney disease Neg Hx    Prostate cancer Neg Hx       Controlled substance contract: 08/26/22; urine tox 08/26/22      Review of Systems  Constitutional:  Negative for activity change, appetite change and fatigue.  Eyes:  Negative for pain.  Respiratory:  Negative for chest tightness and shortness of breath.   Cardiovascular:  Negative for chest pain, palpitations and leg swelling.  Gastrointestinal:  Negative for abdominal pain, nausea and vomiting.  Endocrine: Negative for cold intolerance, heat intolerance, polydipsia and polyuria.  Genitourinary:  Negative for difficulty urinating and dysuria.  Musculoskeletal:  Negative for myalgias.  Skin:  Negative for rash.  Neurological:  Negative for  dizziness, weakness and headaches.  Hematological:  Negative for adenopathy.  All other systems reviewed and are negative.      Objective:   Physical Exam Vitals and nursing note reviewed.  Constitutional:      General: He is not in acute distress.    Appearance: Normal appearance. He is well-developed. He is not ill-appearing.  HENT:     Head: Normocephalic and atraumatic.     Right Ear: Tympanic membrane normal.     Left Ear: Tympanic membrane normal.     Nose: Nose normal.     Mouth/Throat:     Mouth: Mucous membranes are moist.     Pharynx: Oropharynx is clear.  Eyes:     Conjunctiva/sclera: Conjunctivae normal.     Pupils: Pupils are equal, round, and reactive to light.  Neck:     Vascular: No carotid bruit.  Cardiovascular:     Rate and Rhythm: Normal rate and regular rhythm.     Pulses: Normal pulses.     Heart sounds: Normal heart sounds.  Pulmonary:     Effort: Pulmonary effort is normal. No respiratory distress.     Breath sounds: Normal breath sounds. No wheezing, rhonchi or rales.  Abdominal:     General: Bowel sounds are normal.     Palpations: Abdomen is soft.     Tenderness: There is no abdominal tenderness. There is no guarding or rebound.  Musculoskeletal:        General: Normal range of motion.     Cervical back: Normal range of motion. No tenderness.  Lymphadenopathy:     Cervical: No cervical adenopathy.  Skin:    General: Skin is warm and dry.     Capillary Refill: Capillary refill takes less than 2 seconds.  Neurological:     General: No focal deficit present.  Mental Status: He is alert and oriented to person, place, and time.  Psychiatric:        Mood and Affect: Mood normal.        Behavior: Behavior normal.      BP 103/75   Pulse 84   Temp 98.2 F (36.8 C) (Temporal)   Resp 20   Ht _0  (1.778 m)   Wt 206 lb (93.4 kg)   SpO2 98%   BMI 29.56 kg/m       Assessment & Plan:   TORRES HARDENBROOK comes in today with chief  complaint of Medical Management of Chronic Issues   Diagnosis and orders addressed:  1. Type 2 diabetes mellitus with diabetic polyneuropathy, without long-term current use of insulin ([facility]) Continue to watch carb intake - Bayer DCA Hb A1c Waived - Semaglutide,0.25 or 0.5MG/DOS, (OZEMPIC, 0.25 OR 0.5 MG/DOSE,) 2 MG/1.5ML SOPN; 0.060m weekly for 2 weeks then 0.562mweekly  Dx:  E11.9  Dispense: 3 mL; Refill: 2 - dapagliflozin propanediol (FARXIGA) 10 MG TABS tablet; Take 1 tablet (10 mg total) by mouth daily. TAKE 1 TABLET (10 MG TOTAL) BY MOUTH DAILY BEFORE BREAKFAST.  Dispense: 90 tablet; Refill: 1 - metFORMIN (GLUCOPHAGE) 1000 MG tablet; TAKE 1 TABLET BY MOUTH 2 TIMES DAILY WITH A MEAL. (NEEDS TO BE SEEN BEFORE NEXT REFILL)  Dispense: 180 tablet; Refill: 1  2. Hyperlipidemia with target LDL less than 100 Low fat diet - Lipid panel - esomeprazole (NEXIUM) 40 MG capsule; Take 1 capsule (40 mg total) by mouth daily at 12 noon.  Dispense: 90 capsule; Refill: 1 - ezetimibe (ZETIA) 10 MG tablet; Take 1 tablet (10 mg total) by mouth daily.  Dispense: 90 tablet; Refill: 1 - simvastatin (ZOCOR) 40 MG tablet; TAKE 1 TABLET BY MOUTH EVERYDAY AT BEDTIME  Dispense: 90 tablet; Refill: 1  3. Primary hypertension Low sodium diet - CBC with Differential/Platelet - CMP14+EGFR - olmesartan-hydrochlorothiazide (BENICAR HCT) 40-25 MG tablet; TAKE 1 TABLET BY MOUTH EACH DAY.  Needs to be seen for further refills.  Dispense: 90 tablet; Refill: 1  4. Benign prostatic hyperplasia with urinary hesitancy Keep appointment with urology Report new symptoms - PSA, total and free  5. Gastroesophageal reflux disease without esophagitis Avoid spicy foods Avoid eating 2 hours before bedtime - esomeprazole (NEXIUM) 40 MG capsule; Take 1 capsule (40 mg total) by mouth daily at 12 noon.  Dispense: 90 capsule; Refill: 1 - sertraline (ZOLOFT) 100 MG tablet; 2 po qd  Dispense: 180 tablet; Refill: 1  6.  Neuropathy Continue neurontin Avoid going barefoot Check feet for sores regularly  7. BPH with obstruction/lower urinary tract symptoms   8. Recurrent major depressive disorder, in full remission [place] - sertraline (ZOLOFT) 100 MG tablet; 2 po qd  Dispense: 180 tablet; Refill: 1  9. Primary insomnia Bedtime routine Avoid caffeine - zolpidem (AMBIEN) 10 MG tablet; Take 1 tablet (10 mg total) by mouth at bedtime as needed.  Dispense: 90 tablet; Refill: 1  10. Chronic bilateral low back pain with bilateral sciatica Continue with supportive shoes and PRN meds  11. BMI 32.0-32.9,adult Discussed diet and exercise for person with BMI > 25 Will recheck weight in 3-6 months  12. Degeneration of lumbar intervertebral disc - diazepam (VALIUM) 5 MG tablet; Take 1-2 tablets (5-10 mg total) by mouth every 6 (six) hours as needed.  Dispense: 180 tablet; Refill: 1 - gabapentin (NEURONTIN) 300 MG capsule; Take 1 capsule (300 mg total) by mouth 2 (two)  times daily.  Dispense: 180 capsule; Refill: 1   Labs pending Health Maintenance reviewed Diet and exercise encouraged  Follow up plan: 6 months  Collene Leyden, FNP student    [place], [place]

## 2022-11-25 NOTE — Patient Instructions (Signed)

## 2022-11-25 NOTE — Telephone Encounter (Signed)
Can you re-enroll patient for farxiga '10mg'$   I will fax you his returned copy for ozempic Thank you!

## 2022-11-26 ENCOUNTER — Telehealth: Payer: Self-pay | Admitting: Nurse Practitioner

## 2022-11-26 ENCOUNTER — Other Ambulatory Visit: Payer: Self-pay

## 2022-11-26 ENCOUNTER — Other Ambulatory Visit: Payer: PPO

## 2022-11-26 DIAGNOSIS — E875 Hyperkalemia: Secondary | ICD-10-CM

## 2022-11-26 LAB — CBC WITH DIFFERENTIAL/PLATELET
Basophils Absolute: 0.1 10*3/uL (ref 0.0–0.2)
Basos: 1 %
EOS (ABSOLUTE): 0.3 10*3/uL (ref 0.0–0.4)
Eos: 4 %
Hematocrit: 38.1 % (ref 37.5–51.0)
Hemoglobin: 13.1 g/dL (ref 13.0–17.7)
Immature Grans (Abs): 0 10*3/uL (ref 0.0–0.1)
Immature Granulocytes: 1 %
Lymphocytes Absolute: 2.3 10*3/uL (ref 0.7–3.1)
Lymphs: 32 %
MCH: 32 pg (ref 26.6–33.0)
MCHC: 34.4 g/dL (ref 31.5–35.7)
MCV: 93 fL (ref 79–97)
Monocytes Absolute: 0.5 10*3/uL (ref 0.1–0.9)
Monocytes: 7 %
Neutrophils Absolute: 3.9 10*3/uL (ref 1.4–7.0)
Neutrophils: 55 %
Platelets: 175 10*3/uL (ref 150–450)
RBC: 4.1 x10E6/uL — ABNORMAL LOW (ref 4.14–5.80)
RDW: 13 % (ref 11.6–15.4)
WBC: 7 10*3/uL (ref 3.4–10.8)

## 2022-11-26 LAB — CMP14+EGFR
ALT: 17 IU/L (ref 0–44)
AST: 23 IU/L (ref 0–40)
Albumin/Globulin Ratio: 2 (ref 1.2–2.2)
Albumin: 4.8 g/dL (ref 3.9–4.9)
Alkaline Phosphatase: 48 IU/L (ref 44–121)
BUN/Creatinine Ratio: 11 (ref 10–24)
BUN: 23 mg/dL (ref 8–27)
Bilirubin Total: 0.4 mg/dL (ref 0.0–1.2)
CO2: 17 mmol/L — ABNORMAL LOW (ref 20–29)
Calcium: 9.4 mg/dL (ref 8.6–10.2)
Chloride: 109 mmol/L — ABNORMAL HIGH (ref 96–106)
Creatinine, Ser: 2.01 mg/dL — ABNORMAL HIGH (ref 0.76–1.27)
Globulin, Total: 2.4 g/dL (ref 1.5–4.5)
Glucose: 115 mg/dL — ABNORMAL HIGH (ref 70–99)
Potassium: 6.3 mmol/L (ref 3.5–5.2)
Sodium: 140 mmol/L (ref 134–144)
Total Protein: 7.2 g/dL (ref 6.0–8.5)
eGFR: 37 mL/min/{1.73_m2} — ABNORMAL LOW (ref >59)

## 2022-11-26 LAB — LIPID PANEL
Chol/HDL Ratio: 3.7 ratio (ref 0.0–5.0)
Cholesterol, Total: 170 mg/dL (ref 100–199)
HDL: 46 mg/dL (ref >39)
LDL Chol Calc (NIH): 104 mg/dL — ABNORMAL HIGH (ref 0–99)
Triglycerides: 111 mg/dL (ref 0–149)
VLDL Cholesterol Cal: 20 mg/dL (ref 5–40)

## 2022-11-26 LAB — PSA, TOTAL AND FREE
PSA, Free Pct: 42.8 %
PSA, Free: 0.77 ng/mL
Prostate Specific Ag, Serum: 1.8 ng/mL (ref 0.0–4.0)

## 2022-11-26 NOTE — Telephone Encounter (Signed)
Needs to be repeated

## 2022-11-26 NOTE — Telephone Encounter (Signed)
Mailing az&me app to pt home

## 2022-11-26 NOTE — Telephone Encounter (Signed)
Critical lab result.  Potassium 6.3

## 2022-11-26 NOTE — Telephone Encounter (Signed)
Patient notified. Coming back in to office for recheck today

## 2022-11-27 LAB — BMP8+EGFR
BUN/Creatinine Ratio: 16 (ref 10–24)
BUN: 24 mg/dL (ref 8–27)
CO2: 17 mmol/L — ABNORMAL LOW (ref 20–29)
Calcium: 9.3 mg/dL (ref 8.6–10.2)
Chloride: 108 mmol/L — ABNORMAL HIGH (ref 96–106)
Creatinine, Ser: 1.5 mg/dL — ABNORMAL HIGH (ref 0.76–1.27)
Glucose: 183 mg/dL — ABNORMAL HIGH (ref 70–99)
Potassium: 4.9 mmol/L (ref 3.5–5.2)
Sodium: 142 mmol/L (ref 134–144)
eGFR: 52 mL/min/{1.73_m2} — ABNORMAL LOW (ref >59)

## 2022-12-08 ENCOUNTER — Encounter: Payer: Self-pay | Admitting: Nurse Practitioner

## 2022-12-08 ENCOUNTER — Telehealth (INDEPENDENT_AMBULATORY_CARE_PROVIDER_SITE_OTHER): Payer: PPO | Admitting: Nurse Practitioner

## 2022-12-08 DIAGNOSIS — R051 Acute cough: Secondary | ICD-10-CM | POA: Diagnosis not present

## 2022-12-08 NOTE — Patient Instructions (Signed)

## 2022-12-08 NOTE — Progress Notes (Signed)
Virtual Visit Consent   [name], you are scheduled for a virtual visit with [name], [place], a [facility] provider, today.     Just as with appointments in the office, your consent must be obtained to participate.  Your consent will be active for this visit and any virtual visit you may have with one of our providers in the next 365 days.     If you have a MyChart account, a copy of this consent can be sent to you electronically.  All virtual visits are billed to your insurance company just like a traditional visit in the office.    As this is a virtual visit, video technology does not allow for your provider to perform a traditional examination.  This may limit your provider's ability to fully assess your condition.  If your provider identifies any concerns that need to be evaluated in person or the need to arrange testing (such as labs, EKG, etc.), we will make arrangements to do so.     Although advances in technology are sophisticated, we cannot ensure that it will always work on either your end or our end.  If the connection with a video visit is poor, the visit may have to be switched to a telephone visit.  With either a video or telephone visit, we are not always able to ensure that we have a secure connection.     I need to obtain your verbal consent now.   Are you willing to proceed with your visit today? YES   DARYL BEEHLER has provided verbal consent on 12/08/2022 for a virtual visit (video or telephone).  video went out during visit  [place], FNP   Date: 12/08/2022 9:55 AM   Virtual Visit via Video Note   I, [name], connected with [name] ([id number], [birth year]) on 12/08/22 at  5:15 PM EST by a video-enabled telemedicine application and verified that I am speaking with the correct person using two identifiers.  Location: Patient: Virtual Visit Location Patient: Home Provider: Virtual Visit Location Provider: Mobile   I  discussed the limitations of evaluation and management by telemedicine and the availability of in person appointments. The patient expressed understanding and agreed to proceed.    History of Present Illness: [name] is a 61 y.o. who identifies as a male who was assigned male at birth, and is being seen today for uri.  HPI: URI  This is a new problem. The current episode started 1 to 4 weeks ago (wednesday of last week). The problem has been gradually improving. There has been no fever. Associated symptoms include congestion, coughing, headaches and rhinorrhea. Pertinent negatives include no sinus pain. He has tried nothing for the symptoms. The treatment provided mild relief.    Review of Systems  HENT:  Positive for congestion and rhinorrhea. Negative for sinus pain.   Respiratory:  Positive for cough.   Neurological:  Positive for headaches.    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   BPH with obstruction/lower urinary tract symptoms 12/20/2015   History of hypogonadism 12/20/2015   History of nephrolithiasis 12/20/2015   Neuropathy 11/20/2015   Degeneration of lumbar intervertebral disc 04/29/2015   GERD (gastroesophageal reflux disease)    Chronic back pain    Hypertension    Allergic rhinitis 07/25/2013  Insomnia 07/25/2013   Diabetes ([place]) 03/16/2013   Hyperlipidemia with target LDL less than 100 03/16/2013   Depression 03/16/2013   Lumbar stenosis with neurogenic claudication 12/26/2012    Allergies:  Allergies  Allergen Reactions   Acetaminophen    Codeine     anxiety   Lipitor [Atorvastatin]     Myalgia   Medications:  Current Outpatient Medications:    Continuous Blood Gluc Receiver (FREESTYLE LIBRE 2 READER) DEVI, 1 each by Does not apply route every 14 (fourteen) days., Disp: 8 each, Rfl: 3    Continuous Blood Gluc Sensor (FREESTYLE LIBRE 2 SENSOR) MISC, USE TO CHECK SUGAR DX: E11.9, Disp: 2 each, Rfl: 5   cyclobenzaprine (FLEXERIL) 10 MG tablet, Take 10 mg by mouth 3 (three) times daily., Disp: , Rfl:    dapagliflozin propanediol (FARXIGA) 10 MG TABS tablet, Take 1 tablet (10 mg total) by mouth daily. TAKE 1 TABLET (10 MG TOTAL) BY MOUTH DAILY BEFORE BREAKFAST., Disp: 90 tablet, Rfl: 1   diazepam (VALIUM) 5 MG tablet, Take 1-2 tablets (5-10 mg total) by mouth every 6 (six) hours as needed., Disp: 180 tablet, Rfl: 1   esomeprazole (NEXIUM) 40 MG capsule, Take 1 capsule (40 mg total) by mouth daily at 12 noon., Disp: 90 capsule, Rfl: 1   ezetimibe (ZETIA) 10 MG tablet, Take 1 tablet (10 mg total) by mouth daily., Disp: 90 tablet, Rfl: 1   fluticasone (FLONASE) 50 MCG/ACT nasal spray, USE 1 SPRAY IN EACH NOSTRIL ONCE DAILY, Disp: 16 g, Rfl: 3   gabapentin (NEURONTIN) 300 MG capsule, Take 1 capsule (300 mg total) by mouth 2 (two) times daily., Disp: 180 capsule, Rfl: 1   glucose blood (ACCU-CHEK GUIDE) test strip, Check BS up to 4 times daily Dx e11.9, Disp: 400 strip, Rfl: 3   hydrocortisone (ANUSOL-HC) 2.5 % rectal cream, Place 1 application rectally 2 (two) times daily., Disp: 30 g, Rfl: 0   loratadine (CLARITIN) 10 MG tablet, TAKE ONE (1) TABLET EACH DAY, Disp: 90 tablet, Rfl: 1   meloxicam (MOBIC) 15 MG tablet, TAKE 1 TABLET BY MOUTH EVERY DAY WITH FOOD, Disp: 90 tablet, Rfl: 0   metFORMIN (GLUCOPHAGE) 1000 MG tablet, TAKE 1 TABLET BY MOUTH 2 TIMES DAILY WITH A MEAL. (NEEDS TO BE SEEN BEFORE NEXT REFILL), Disp: 180 tablet, Rfl: 1   Multiple Vitamin (MULTIVITAMIN WITH MINERALS) TABS, Take 1 tablet by mouth daily., Disp: , Rfl:    olmesartan-hydrochlorothiazide (BENICAR HCT) 40-25 MG tablet, TAKE 1 TABLET BY MOUTH EACH DAY.  Needs to be seen for further refills., Disp: 90 tablet, Rfl: 1   olopatadine (PATANOL) 0.1 % ophthalmic solution, PLACE 1 DROP IN EACH EYE TWICE DAILY, Disp: 5 mL, Rfl:  0   ONETOUCH DELICA LANCETS FINE MISC, TEST UP TO 4 TIMES DIALY, Disp: 100 each, Rfl: 10   Semaglutide,0.25 or 0.'5MG'$ /DOS, (OZEMPIC, 0.25 OR 0.5 MG/DOSE,) 2 MG/1.5ML SOPN, 0.'025mg'$  weekly for 2 weeks then 0.'5mg'$  weekly  Dx:  E11.9, Disp: 3 mL, Rfl: 2   sertraline (ZOLOFT) 100 MG tablet, 2 po qd, Disp: 180 tablet, Rfl: 1   simvastatin (ZOCOR) 40 MG tablet, TAKE 1 TABLET BY MOUTH EVERYDAY AT BEDTIME, Disp: 90 tablet, Rfl: 1   tadalafil (CIALIS) 5 MG tablet, TAKE 1 TABLET BY MOUTH DAILY AS NEEDED FOR ERECTILE DYSFUNCTION., Disp: 90 tablet, Rfl: 1   zolpidem (AMBIEN) 10 MG tablet, Take 1 tablet (10 mg total) by mouth at bedtime as needed., Disp: 90 tablet, Rfl: 1  Observations/Objective: Patient  is well-developed, well-nourished in no acute distress.  Resting comfortably  at home.  Head is normocephalic, atraumatic.  No labored breathing.  Speech is clear and coherent with logical content.  Patient is alert and oriented at baseline.  Raspy voice Wet cough  Assessment and Plan:  JERL MUNYAN in today with chief complaint of URI   1. Acute cough 1. Take meds as prescribed 2. Use a cool mist humidifier especially during the winter months and when heat has been humid. 3. Use saline nose sprays frequently 4. Saline irrigations of the nose can be very helpful if Ross frequently.  * 4X daily for 1 week*  * Use of a nettie pot can be helpful with this. Follow directions with this* 5. Drink plenty of fluids 6. Keep thermostat turn down low 7.For any cough or congestion- mucinex 8. For fever or aces or pains- take tylenol or ibuprofen appropriate for age and weight.  * for fevers greater than 101 orally you may alternate ibuprofen and tylenol every  3 hours.      Follow Up Instructions: I discussed the assessment and treatment plan with the patient. The patient was provided an opportunity to ask questions and all were answered. The patient agreed with the plan and demonstrated an  understanding of the instructions.  A copy of instructions were sent to the patient via MyChart.  The patient was advised to call back or seek an in-person evaluation if the symptoms worsen or if the condition fails to improve as anticipated.  Time:  I spent 5 minutes with the patient via telehealth technology discussing the above problems/concerns.    William Hassell Done, FNP

## 2022-12-14 ENCOUNTER — Encounter: Payer: Self-pay | Admitting: Family Medicine

## 2022-12-14 ENCOUNTER — Ambulatory Visit (INDEPENDENT_AMBULATORY_CARE_PROVIDER_SITE_OTHER): Payer: PPO | Admitting: Family Medicine

## 2022-12-14 VITALS — BP 125/84 | HR 89 | Temp 97.9°F | Ht 70.0 in | Wt 208.8 lb

## 2022-12-14 DIAGNOSIS — J209 Acute bronchitis, unspecified: Secondary | ICD-10-CM | POA: Diagnosis not present

## 2022-12-14 DIAGNOSIS — R051 Acute cough: Secondary | ICD-10-CM | POA: Diagnosis not present

## 2022-12-14 MED ORDER — PREDNISONE 20 MG PO TABS: ORAL_TABLET | ORAL | 0 refills | Status: DC

## 2022-12-14 MED ORDER — METHYLPREDNISOLONE ACETATE 40 MG/ML IJ SUSP
40.0000 mg | Freq: Once | INTRAMUSCULAR | Status: AC
  Administered 2022-12-14: 40 mg via INTRAMUSCULAR

## 2022-12-14 MED ORDER — BENZONATATE 100 MG PO CAPS: 100.0000 mg | ORAL_CAPSULE | Freq: Three times a day (TID) | ORAL | 0 refills | Status: DC | PRN

## 2022-12-14 MED ORDER — ALBUTEROL SULFATE HFA 108 (90 BASE) MCG/ACT IN AERS: 2.0000 | INHALATION_SPRAY | Freq: Four times a day (QID) | RESPIRATORY_TRACT | 0 refills | Status: DC | PRN

## 2022-12-14 MED ORDER — AZITHROMYCIN 250 MG PO TABS: ORAL_TABLET | ORAL | 0 refills | Status: DC

## 2022-12-14 MED ORDER — ALBUTEROL SULFATE (2.5 MG/3ML) 0.083% IN NEBU
2.5000 mg | INHALATION_SOLUTION | Freq: Once | RESPIRATORY_TRACT | Status: AC
  Administered 2022-12-14: 2.5 mg via RESPIRATORY_TRACT

## 2022-12-14 NOTE — Patient Instructions (Signed)
Start prednisone with breakfast tomorrow. Zpak start today. You were prescribed a cough perle but really the symptoms will likely get better with the above. Albuterol inhaler sent as well.  Acute Bronchitis, Adult  Acute bronchitis is when air tubes in the lungs (bronchi) suddenly get swollen. The condition can make it hard for you to breathe. In adults, acute bronchitis usually goes away within 2 weeks. A cough caused by bronchitis may last up to 3 weeks. Smoking, allergies, and asthma can make the condition worse. What are the causes? Germs that cause cold and flu (viruses). The most common cause of this condition is the virus that causes the common cold. Bacteria. Substances that bother (irritate) the lungs, including: Smoke from cigarettes and other types of tobacco. Dust and pollen. Fumes from chemicals, gases, or burned fuel. Indoor or outdoor air pollution. What increases the risk? A weak body's defense system. This is also called the immune system. Any condition that affects your lungs and breathing, such as asthma. What are the signs or symptoms? A cough. Coughing up clear, yellow, or green mucus. Making high-pitched whistling sounds when you breathe, most often when you breathe out (wheezing). Runny or stuffy nose. Having too much mucus in your lungs (chest congestion). Shortness of breath. Body aches. A sore throat. How is this treated? Acute bronchitis may go away over time without treatment. Your doctor may tell you to: Drink more fluids. This will help thin your mucus so it is easier to cough up. Use a device that gets medicine into your lungs (inhaler). Use a vaporizer or a humidifier. These are machines that add water to the air. This helps with coughing and poor breathing. Take a medicine that thins mucus and helps clear it from your lungs. Take a medicine that prevents or stops coughing. It is not common to take an antibiotic medicine for this condition. Follow  these instructions at home:  Take over-the-counter and prescription medicines only as told by your doctor. Use an inhaler, vaporizer, or humidifier as told by your doctor. Take two teaspoons (10 mL) of honey at bedtime. This helps lessen your coughing at night. Drink enough fluid to keep your pee (urine) pale yellow. Do not smoke or use any products that contain nicotine or tobacco. If you need help quitting, ask your doctor. Get a lot of rest. Return to your normal activities when your doctor says that it is safe. Keep all follow-up visits. How is this prevented?  Wash your hands often with soap and water for at least 20 seconds. If you cannot use soap and water, use hand sanitizer. Avoid contact with people who have cold symptoms. Try not to touch your mouth, nose, or eyes with your hands. Avoid breathing in smoke or chemical fumes. Make sure to get the flu shot every year. Contact a doctor if: Your symptoms do not get better in 2 weeks. You have trouble coughing up the mucus. Your cough keeps you awake at night. You have a fever. Get help right away if: You cough up blood. You have chest pain. You have very bad shortness of breath. You faint or keep feeling like you are going to faint. You have a very bad headache. Your fever or chills get worse. These symptoms may be an emergency. Get help right away. Call your local emergency services (911 in the [place] Do not wait to see if the symptoms will go away. Do not drive yourself to the hospital. Summary Acute bronchitis is when air  tubes in the lungs (bronchi) suddenly get swollen. In adults, acute bronchitis usually goes away within 2 weeks. Drink more fluids. This will help thin your mucus so it is easier to cough up. Take over-the-counter and prescription medicines only as told by your doctor. Contact a doctor if your symptoms do not improve after 2 weeks of treatment. This information is not intended to replace advice given to  you by your health care provider. Make sure you discuss any questions you have with your health care provider. Document Revised: 04/01/2021 Document Reviewed: 04/01/2021 Elsevier Patient Education  [place].

## 2022-12-14 NOTE — Progress Notes (Signed)
Subjective: CC: f/u cough ? shingles PCP: Chevis Pretty, FNP [name] is a 63 y.o. male presenting to clinic today for:  1.  Cough Patient was diagnosed with a URI on 12/08/2022.  It was suspected to be viral in nature so they recommended that he proceed with supportive care for cold symptoms including over-the-counter products.  Presents today and notes that symptoms have really not improved.  He has tested at home for COVID x 3 and this was negative.  He notes over the last 3 nights has been having some sweating, ongoing headaches x 3 weeks.  He denies any associated neurologic changes including blurred vision, double vision, unilateral weakness or sensory changes.  He felt a little unsteady when he was super sick but otherwise has not really had any gait instability.  He had a rash behind the left ear recently and he had a friend who thought it might have been a shingles rash.  He still gets some burning sensation behind that ear but is not having any blisters at this time.  Has been utilizing Tylenol with some mild relief of symptoms.  Denies any personal history obstructive sleep apnea.  He has been utilizing Mucinex and plenty of water but notes that cough remains dry.  He feels like he has a lot of phlegm in his chest.   ROS: Per HPI  Allergies  Allergen Reactions   Acetaminophen    Codeine     anxiety   Lipitor [Atorvastatin]     Myalgia   Past Medical History:  Diagnosis Date   Arthritis    Chronic back pain    stenosis   Constipation    with pain meds   Depression    takes Zoloft daily   Diabetes mellitus without complication (HCC)    takes Metformni daily   Dizziness    GERD (gastroesophageal reflux disease)    takes Nexium daily   Headache(784.0)    occasionally   Hyperlipidemia    takes Zetia daily   Hypertension    takes Benicar daily   Impingement syndrome of left shoulder    Insomnia    takes Ambien nightly   Joint pain    Left rotator  cuff tear    Pneumonia 1986   walking   Seasonal allergies    takes Claritin daily   Tear of medial meniscus of right knee    Urinary frequency    takes Flomax daily   Urinary urgency     Current Outpatient Medications:    Continuous Blood Gluc Receiver (FREESTYLE LIBRE 2 READER) DEVI, 1 each by Does not apply route every 14 (fourteen) days., Disp: 8 each, Rfl: 3   Continuous Blood Gluc Sensor (FREESTYLE LIBRE 2 SENSOR) MISC, USE TO CHECK SUGAR DX: E11.9, Disp: 2 each, Rfl: 5   cyclobenzaprine (FLEXERIL) 10 MG tablet, Take 10 mg by mouth 3 (three) times daily., Disp: , Rfl:    dapagliflozin propanediol (FARXIGA) 10 MG TABS tablet, Take 1 tablet (10 mg total) by mouth daily. TAKE 1 TABLET (10 MG TOTAL) BY MOUTH DAILY BEFORE BREAKFAST., Disp: 90 tablet, Rfl: 1   diazepam (VALIUM) 5 MG tablet, Take 1-2 tablets (5-10 mg total) by mouth every 6 (six) hours as needed., Disp: 180 tablet, Rfl: 1   esomeprazole (NEXIUM) 40 MG capsule, Take 1 capsule (40 mg total) by mouth daily at 12 noon., Disp: 90 capsule, Rfl: 1   ezetimibe (ZETIA) 10 MG tablet, Take 1 tablet (10 mg total)  by mouth daily., Disp: 90 tablet, Rfl: 1   fluticasone (FLONASE) 50 MCG/ACT nasal spray, USE 1 SPRAY IN EACH NOSTRIL ONCE DAILY, Disp: 16 g, Rfl: 3   gabapentin (NEURONTIN) 300 MG capsule, Take 1 capsule (300 mg total) by mouth 2 (two) times daily., Disp: 180 capsule, Rfl: 1   glucose blood (ACCU-CHEK GUIDE) test strip, Check BS up to 4 times daily Dx e11.9, Disp: 400 strip, Rfl: 3   hydrocortisone (ANUSOL-HC) 2.5 % rectal cream, Place 1 application rectally 2 (two) times daily., Disp: 30 g, Rfl: 0   loratadine (CLARITIN) 10 MG tablet, TAKE ONE (1) TABLET EACH DAY, Disp: 90 tablet, Rfl: 1   meloxicam (MOBIC) 15 MG tablet, TAKE 1 TABLET BY MOUTH EVERY DAY WITH FOOD, Disp: 90 tablet, Rfl: 0   metFORMIN (GLUCOPHAGE) 1000 MG tablet, TAKE 1 TABLET BY MOUTH 2 TIMES DAILY WITH A MEAL. (NEEDS TO BE SEEN BEFORE NEXT REFILL), Disp: 180  tablet, Rfl: 1   Multiple Vitamin (MULTIVITAMIN WITH MINERALS) TABS, Take 1 tablet by mouth daily., Disp: , Rfl:    olmesartan-hydrochlorothiazide (BENICAR HCT) 40-25 MG tablet, TAKE 1 TABLET BY MOUTH EACH DAY.  Needs to be seen for further refills., Disp: 90 tablet, Rfl: 1   olopatadine (PATANOL) 0.1 % ophthalmic solution, PLACE 1 DROP IN EACH EYE TWICE DAILY, Disp: 5 mL, Rfl: 0   ONETOUCH DELICA LANCETS FINE MISC, TEST UP TO 4 TIMES DIALY, Disp: 100 each, Rfl: 10   Semaglutide,0.25 or 0.'5MG'$ /DOS, (OZEMPIC, 0.25 OR 0.5 MG/DOSE,) 2 MG/1.5ML SOPN, 0.'025mg'$  weekly for 2 weeks then 0.'5mg'$  weekly  Dx:  E11.9, Disp: 3 mL, Rfl: 2   sertraline (ZOLOFT) 100 MG tablet, 2 po qd, Disp: 180 tablet, Rfl: 1   simvastatin (ZOCOR) 40 MG tablet, TAKE 1 TABLET BY MOUTH EVERYDAY AT BEDTIME, Disp: 90 tablet, Rfl: 1   tadalafil (CIALIS) 5 MG tablet, TAKE 1 TABLET BY MOUTH DAILY AS NEEDED FOR ERECTILE DYSFUNCTION., Disp: 90 tablet, Rfl: 1   zolpidem (AMBIEN) 10 MG tablet, Take 1 tablet (10 mg total) by mouth at bedtime as needed., Disp: 90 tablet, Rfl: 1 Social History   Socioeconomic History   Marital status: Divorced    Spouse name: Not on file   Number of children: Not on file   Years of education: Not on file   Highest education level: Not on file  Occupational History   Occupation: retired  Tobacco Use   Smoking status: Never   Smokeless tobacco: Never  Vaping Use   Vaping Use: Never used  Substance and Sexual Activity   Alcohol use: Yes    Comment: rarely   Drug use: No   Sexual activity: Yes  Other Topics Concern   Not on file  Social History Narrative   Lives alone. Daughter lives nearby   Social Determinants of Health   Financial Resource Strain: Low Risk  (10/25/2022)   Overall Financial Resource Strain (CARDIA)    Difficulty of Paying Living Expenses: Not hard at all  Food Insecurity: No Food Insecurity (10/25/2022)   Hunger Vital Sign    Worried About Running Out of Food in the Last  Year: Never true    Ran Out of Food in the Last Year: Never true  Transportation Needs: No Transportation Needs (10/25/2022)   PRAPARE - Hydrologist (Medical): No    Lack of Transportation (Non-Medical): No  Physical Activity: Insufficiently Active (10/25/2022)   Exercise Vital Sign    Days of Exercise  per Week: 3 days    Minutes of Exercise per Session: 30 min  Stress: No Stress Concern Present (10/25/2022)   [place]    Feeling of Stress : Not at all  Social Connections: Moderately Integrated (10/25/2022)   Social Connection and Isolation Panel [NHANES]    Frequency of Communication with Friends and Family: More than three times a week    Frequency of Social Gatherings with Friends and Family: More than three times a week    Attends Religious Services: More than 4 times per year    Active Member of [employer] or Organizations: Yes    Attends Music therapist: More than 4 times per year    Marital Status: Divorced  Intimate Partner Violence: Not At Risk (10/25/2022)   Humiliation, Afraid, Rape, and Kick questionnaire    Fear of Current or Ex-Partner: No    Emotionally Abused: No    Physically Abused: No    Sexually Abused: No   Family History  Problem Relation Age of Onset   COPD Mother    Hypertension Father    Alzheimer's disease Father    Diabetes Maternal Grandmother    Kidney disease Neg Hx    Prostate cancer Neg Hx     Objective: Office vital signs reviewed. BP 125/84   Pulse 89   Temp 97.9 F (36.6 C) (Temporal)   Ht '5\' 10"'$  (1.778 m)   Wt 208 lb 12.8 oz (94.7 kg)   SpO2 98%   BMI 29.96 kg/m   Physical Examination:  General: Awake, alert, well nourished, nontoxic male. No acute distress HEENT: Normal    Neck: No masses palpated. No lymphadenopathy    Ears: Tympanic membranes intact, normal light reflex, no erythema, no bulging    Eyes: PERRLA,  extraocular membranes intact, sclera white    Nose: nasal turbinates moist,  clear nasal discharge    Throat: moist mucus membranes, no erythema, no tonsillar exudate.  Airway is patent Cardio: regular rate and rhythm, S1S2 heard, no murmurs appreciated Pulm: Global expiratory wheezes that are worse in the apex as compared to the bases.  Fair air movement.  Normal work of breathing on room air. Neuro: No focal neurologic deficits  Assessment/ Plan: 62 y.o. male   Bronchitis, acute, with bronchospasm - Plan: COVID-19, Flu A+B and RSV, methylPREDNISolone acetate (DEPO-MEDROL) injection 40 mg, azithromycin (ZITHROMAX) 250 MG tablet, predniSONE (DELTASONE) 20 MG tablet, benzonatate (TESSALON PERLES) 100 MG capsule, albuterol (VENTOLIN HFA) 108 (90 Base) MCG/ACT inhaler, albuterol (PROVENTIL) (2.5 MG/3ML) 0.083% nebulizer solution 2.5 mg  Going to treat with oral antibiotics, steroid burst and albuterol inhaler.  He was given an albuterol nebulizer here in office and really responded well to that with almost total resolution of expiratory wheezes noted initially.  We discussed red flag signs and symptoms warranting further evaluation.  He voiced good understanding will follow-up as needed  No orders of the defined types were placed in this encounter.  No orders of the defined types were placed in this encounter.    Janora Norlander, DO [place] [phone]

## 2022-12-15 LAB — COVID-19, FLU A+B AND RSV
Influenza A, NAA: NOT DETECTED
Influenza B, NAA: NOT DETECTED
RSV, NAA: NOT DETECTED
SARS-CoV-2, NAA: NOT DETECTED

## 2022-12-24 ENCOUNTER — Other Ambulatory Visit: Payer: Self-pay

## 2022-12-24 ENCOUNTER — Telehealth: Payer: Self-pay | Admitting: Nurse Practitioner

## 2022-12-24 DIAGNOSIS — J209 Acute bronchitis, unspecified: Secondary | ICD-10-CM

## 2022-12-24 MED ORDER — ALBUTEROL SULFATE HFA 108 (90 BASE) MCG/ACT IN AERS: 2.0000 | INHALATION_SPRAY | Freq: Four times a day (QID) | RESPIRATORY_TRACT | 1 refills | Status: DC | PRN

## 2022-12-24 NOTE — Telephone Encounter (Signed)
Inhaler sent to [facility] per patients request. Patient notified and verbalized understanding

## 2022-12-24 NOTE — Telephone Encounter (Signed)
Pt seen 12/14/2022. Pt asking for a refill on albuterol (VENTOLIN HFA) 108 (90 Base) MCG/ACT inhaler for his cough.  Use [facility]

## 2022-12-28 ENCOUNTER — Other Ambulatory Visit: Payer: Self-pay | Admitting: Nurse Practitioner

## 2022-12-28 DIAGNOSIS — F5101 Primary insomnia: Secondary | ICD-10-CM

## 2023-01-07 ENCOUNTER — Telehealth: Payer: Self-pay

## 2023-01-07 NOTE — Telephone Encounter (Signed)
Patient asssistance medication received, Ozempic.  Patient informed.

## 2023-04-08 ENCOUNTER — Telehealth: Payer: Self-pay

## 2023-04-08 NOTE — Telephone Encounter (Signed)
Patient informed we have received his shipment of Ozempic and it has been placed in refrigerator for pick up.,

## 2023-05-27 ENCOUNTER — Ambulatory Visit: Payer: PPO | Admitting: Nurse Practitioner

## 2023-06-17 ENCOUNTER — Encounter: Payer: Self-pay | Admitting: Nurse Practitioner

## 2023-06-17 ENCOUNTER — Ambulatory Visit (INDEPENDENT_AMBULATORY_CARE_PROVIDER_SITE_OTHER): Payer: PPO | Admitting: Nurse Practitioner

## 2023-06-17 VITALS — BP 103/70 | HR 97 | Temp 98.1°F | Resp 20 | Ht 70.0 in | Wt 214.0 lb

## 2023-06-17 DIAGNOSIS — N401 Enlarged prostate with lower urinary tract symptoms: Secondary | ICD-10-CM

## 2023-06-17 DIAGNOSIS — E119 Type 2 diabetes mellitus without complications: Secondary | ICD-10-CM | POA: Diagnosis not present

## 2023-06-17 DIAGNOSIS — K219 Gastro-esophageal reflux disease without esophagitis: Secondary | ICD-10-CM

## 2023-06-17 DIAGNOSIS — J209 Acute bronchitis, unspecified: Secondary | ICD-10-CM

## 2023-06-17 DIAGNOSIS — M51369 Other intervertebral disc degeneration, lumbar region without mention of lumbar back pain or lower extremity pain: Secondary | ICD-10-CM

## 2023-06-17 DIAGNOSIS — M5136 Other intervertebral disc degeneration, lumbar region: Secondary | ICD-10-CM

## 2023-06-17 DIAGNOSIS — N138 Other obstructive and reflux uropathy: Secondary | ICD-10-CM | POA: Diagnosis not present

## 2023-06-17 DIAGNOSIS — Z7985 Long-term (current) use of injectable non-insulin antidiabetic drugs: Secondary | ICD-10-CM

## 2023-06-17 DIAGNOSIS — Z7984 Long term (current) use of oral hypoglycemic drugs: Secondary | ICD-10-CM

## 2023-06-17 DIAGNOSIS — I10 Essential (primary) hypertension: Secondary | ICD-10-CM

## 2023-06-17 DIAGNOSIS — E1142 Type 2 diabetes mellitus with diabetic polyneuropathy: Secondary | ICD-10-CM

## 2023-06-17 DIAGNOSIS — E1140 Type 2 diabetes mellitus with diabetic neuropathy, unspecified: Secondary | ICD-10-CM | POA: Diagnosis not present

## 2023-06-17 DIAGNOSIS — F5101 Primary insomnia: Secondary | ICD-10-CM

## 2023-06-17 DIAGNOSIS — Z6832 Body mass index (BMI) 32.0-32.9, adult: Secondary | ICD-10-CM

## 2023-06-17 DIAGNOSIS — F3342 Major depressive disorder, recurrent, in full remission: Secondary | ICD-10-CM | POA: Diagnosis not present

## 2023-06-17 DIAGNOSIS — G629 Polyneuropathy, unspecified: Secondary | ICD-10-CM | POA: Diagnosis not present

## 2023-06-17 DIAGNOSIS — E1169 Type 2 diabetes mellitus with other specified complication: Secondary | ICD-10-CM

## 2023-06-17 DIAGNOSIS — E785 Hyperlipidemia, unspecified: Secondary | ICD-10-CM | POA: Diagnosis not present

## 2023-06-17 LAB — CBC WITH DIFFERENTIAL/PLATELET
Basophils Absolute: 0.1 10*3/uL (ref 0.0–0.2)
Basos: 1 %
EOS (ABSOLUTE): 0.4 10*3/uL (ref 0.0–0.4)
Eos: 5 %
Hematocrit: 41.3 % (ref 37.5–51.0)
Hemoglobin: 13.9 g/dL (ref 13.0–17.7)
Immature Grans (Abs): 0.1 10*3/uL (ref 0.0–0.1)
Immature Granulocytes: 1 %
Lymphocytes Absolute: 3.1 10*3/uL (ref 0.7–3.1)
Lymphs: 41 %
MCH: 31.2 pg (ref 26.6–33.0)
MCHC: 33.7 g/dL (ref 31.5–35.7)
MCV: 93 fL (ref 79–97)
Monocytes Absolute: 0.5 10*3/uL (ref 0.1–0.9)
Monocytes: 7 %
Neutrophils Absolute: 3.5 10*3/uL (ref 1.4–7.0)
Neutrophils: 45 %
Platelets: 184 10*3/uL (ref 150–450)
RBC: 4.45 x10E6/uL (ref 4.14–5.80)
RDW: 12.9 % (ref 11.6–15.4)
WBC: 7.7 10*3/uL (ref 3.4–10.8)

## 2023-06-17 LAB — CMP14+EGFR
ALT: 23 IU/L (ref 0–44)
AST: 28 IU/L (ref 0–40)
Albumin: 4.8 g/dL (ref 3.9–4.9)
Alkaline Phosphatase: 56 IU/L (ref 44–121)
BUN/Creatinine Ratio: 17 (ref 10–24)
BUN: 30 mg/dL — ABNORMAL HIGH (ref 8–27)
Bilirubin Total: 0.4 mg/dL (ref 0.0–1.2)
CO2: 16 mmol/L — ABNORMAL LOW (ref 20–29)
Calcium: 9.6 mg/dL (ref 8.6–10.2)
Chloride: 107 mmol/L — ABNORMAL HIGH (ref 96–106)
Creatinine, Ser: 1.8 mg/dL — ABNORMAL HIGH (ref 0.76–1.27)
Globulin, Total: 2.2 g/dL (ref 1.5–4.5)
Glucose: 137 mg/dL — ABNORMAL HIGH (ref 70–99)
Potassium: 5.4 mmol/L — ABNORMAL HIGH (ref 3.5–5.2)
Sodium: 141 mmol/L (ref 134–144)
Total Protein: 7 g/dL (ref 6.0–8.5)
eGFR: 42 mL/min/{1.73_m2} — ABNORMAL LOW (ref >59)

## 2023-06-17 LAB — LIPID PANEL
Chol/HDL Ratio: 4.1 ratio (ref 0.0–5.0)
Cholesterol, Total: 170 mg/dL (ref 100–199)
HDL: 41 mg/dL (ref >39)
LDL Chol Calc (NIH): 99 mg/dL (ref 0–99)
Triglycerides: 174 mg/dL — ABNORMAL HIGH (ref 0–149)
VLDL Cholesterol Cal: 30 mg/dL (ref 5–40)

## 2023-06-17 LAB — BAYER DCA HB A1C WAIVED: HB A1C (BAYER DCA - WAIVED): 5.8 % — ABNORMAL HIGH (ref 4.8–5.6)

## 2023-06-17 MED ORDER — METFORMIN HCL 1000 MG PO TABS
ORAL_TABLET | ORAL | 1 refills | Status: DC
Start: 2023-06-17 — End: 2023-09-19

## 2023-06-17 MED ORDER — SIMVASTATIN 40 MG PO TABS: ORAL_TABLET | ORAL | 1 refills | Status: DC

## 2023-06-17 MED ORDER — ESOMEPRAZOLE MAGNESIUM 40 MG PO CPDR: 40.0000 mg | DELAYED_RELEASE_CAPSULE | Freq: Every day | ORAL | 1 refills | Status: DC

## 2023-06-17 MED ORDER — FREESTYLE LIBRE 2 SENSOR MISC
1.00 | 5 refills | Status: AC
Start: 2023-06-17 — End: ?

## 2023-06-17 MED ORDER — GABAPENTIN 300 MG PO CAPS: 300.0000 mg | ORAL_CAPSULE | Freq: Two times a day (BID) | ORAL | 1 refills | Status: DC

## 2023-06-17 MED ORDER — DAPAGLIFLOZIN PROPANEDIOL 10 MG PO TABS: 10.0000 mg | ORAL_TABLET | Freq: Every day | ORAL | 1 refills | Status: DC

## 2023-06-17 MED ORDER — ZOLPIDEM TARTRATE 10 MG PO TABS
10.0000 mg | ORAL_TABLET | Freq: Every evening | ORAL | 1 refills | Status: DC | PRN
Start: 2023-06-17 — End: 2023-12-30

## 2023-06-17 MED ORDER — DIAZEPAM 5 MG PO TABS: 5.0000 mg | ORAL_TABLET | Freq: Four times a day (QID) | ORAL | 1 refills | Status: DC | PRN

## 2023-06-17 MED ORDER — ALBUTEROL SULFATE HFA 108 (90 BASE) MCG/ACT IN AERS
2.00 | INHALATION_SPRAY | Freq: Four times a day (QID) | RESPIRATORY_TRACT | 1 refills | Status: AC | PRN
Start: 2023-06-17 — End: ?

## 2023-06-17 MED ORDER — OLMESARTAN MEDOXOMIL-HCTZ 40-25 MG PO TABS
ORAL_TABLET | ORAL | 1 refills | Status: DC
Start: 2023-06-17 — End: 2023-12-30

## 2023-06-17 MED ORDER — EZETIMIBE 10 MG PO TABS
10.0000 mg | ORAL_TABLET | Freq: Every day | ORAL | 1 refills | Status: AC
Start: 2023-06-17 — End: ?

## 2023-06-17 MED ORDER — SERTRALINE HCL 100 MG PO TABS: 200.0000 mg | ORAL_TABLET | Freq: Every day | ORAL | 1 refills | Status: DC

## 2023-06-17 NOTE — Patient Instructions (Signed)

## 2023-06-17 NOTE — Addendum Note (Signed)
Addended by: Bennie Pierini on: 06/17/2023 11:20 AM   Modules accepted: Orders

## 2023-06-17 NOTE — Progress Notes (Signed)
Subjective:    Patient ID: [name], male    DOB: February 25, 1961, 62 y.o.   MRN: [id number]   Chief Complaint: medical management of chronic issues     HPI:  KHYLER Ross is a 62 y.o. who identifies as a male who was assigned male at birth.   Social history: Lives with: by himself Work history: retired  8 [name] is a regular patient of mine that is see for chronic medical conditions. The conditions listed below were discussed at visit.  Comes in today for follow up of the following chronic medical issues:  1. Primary hypertension No c/o chest pain, sob or headache. Does not check blood pressure at home. BP Readings from Last 3 Encounters:  12/14/22 125/84  11/25/22 103/75  08/26/22 102/70     2. Long-term (current) use of injectable non-insulin antidiabetic drugs He says that the ozempic is making him feel bad. He says he has no appeitte on ozempic. Says he has to make hisself eat. Says he also has some diarrhea.  3. Diabetes mellitus treated with oral medication (HCC) Fasting blood sugars are running around 110-140. No  low blood sugars Lab Results  Component Value Date   HGBA1C 6.2 (H) 11/25/2022     4. Gastroesophageal reflux disease without esophagitis Is on nexium and is doing well.  5. Neuropathy Has numbness and tinging of bil toes  6. Hyperlipidemia associated with type 2 diabetes mellitus (HCC) Does try to wtahc diet but does little to no exercise. Lab Results  Component Value Date   CHOL 170 11/25/2022   HDL 46 11/25/2022   LDLCALC 104 (H) 11/25/2022   TRIG 111 11/25/2022   CHOLHDL 3.7 11/25/2022     7. Recurrent major depressive disorder, in full remission (HCC) Has been on zoloft for several years.    06/17/2023   10:44 AM 12/14/2022    1:05 PM 11/25/2022   11:02 AM  Depression screen PHQ 2/9  Decreased Interest 3 0 0  Down, Depressed, Hopeless 2 0 0  PHQ - 2 Score 5 0 0  Altered sleeping 3 0 0  Tired, decreased energy 3 0 0  Change  in appetite 0 0 0  Feeling bad or failure about yourself  1 0 0  Trouble concentrating 1 0 0  Moving slowly or fidgety/restless 0 0 0  Suicidal thoughts 0 0 0  PHQ-9 Score 13 0 0  Difficult doing work/chores Somewhat difficult Not difficult at all Not difficult at all     8. Primary insomnia Is on ambien to sleep. Sleeps about 7-8 hours a night  9. BPH with obstruction/lower urinary tract symptoms No voiding issues Lab Results  Component Value Date   PSA1 1.8 11/25/2022   PSA1 1.8 11/10/2021   PSA1 1.2 02/15/2020   PSA 0.7 02/25/2015      10. BMI 32.0-32.9,adult Weight is up 6lbs BMI Readings from Last 3 Encounters:  06/17/23 30.71 kg/m  12/14/22 29.96 kg/m  11/25/22 29.56 kg/m   Wt Readings from Last 3 Encounters:  06/17/23 214 lb (97.1 kg)  12/14/22 208 lb 12.8 oz (94.7 kg)  11/25/22 206 lb (93.4 kg)      New complaints: None today  Allergies  Allergen Reactions   Acetaminophen    Codeine     anxiety   Lipitor [Atorvastatin]     Myalgia   Outpatient Encounter Medications as of 06/17/2023  Medication Sig   albuterol (VENTOLIN HFA) 108 (90 Base) MCG/ACT inhaler  Inhale 2 puffs into the lungs every 6 (six) hours as needed for wheezing or shortness of breath.   azithromycin (ZITHROMAX) 250 MG tablet Take 2 tablets today, then take 1 tablet daily until gone.   benzonatate (TESSALON PERLES) 100 MG capsule Take 1 capsule (100 mg total) by mouth 3 (three) times daily as needed for cough.   Continuous Blood Gluc Receiver (FREESTYLE LIBRE 2 READER) DEVI 1 each by Does not apply route every 14 (fourteen) days.   Continuous Blood Gluc Sensor (FREESTYLE LIBRE 2 SENSOR) MISC USE TO CHECK SUGAR DX: E11.9   cyclobenzaprine (FLEXERIL) 10 MG tablet Take 10 mg by mouth 3 (three) times daily.   dapagliflozin propanediol (FARXIGA) 10 MG TABS tablet Take 1 tablet (10 mg total) by mouth daily. TAKE 1 TABLET (10 MG TOTAL) BY MOUTH DAILY BEFORE BREAKFAST.   diazepam (VALIUM) 5 MG  tablet Take 1-2 tablets (5-10 mg total) by mouth every 6 (six) hours as needed.   esomeprazole (NEXIUM) 40 MG capsule Take 1 capsule (40 mg total) by mouth daily at 12 noon.   ezetimibe (ZETIA) 10 MG tablet Take 1 tablet (10 mg total) by mouth daily.   fluticasone (FLONASE) 50 MCG/ACT nasal spray USE 1 SPRAY IN EACH NOSTRIL ONCE DAILY   gabapentin (NEURONTIN) 300 MG capsule Take 1 capsule (300 mg total) by mouth 2 (two) times daily.   glucose blood (ACCU-CHEK GUIDE) test strip Check BS up to 4 times daily Dx e11.9   hydrocortisone (ANUSOL-HC) 2.5 % rectal cream Place 1 application rectally 2 (two) times daily.   loratadine (CLARITIN) 10 MG tablet TAKE ONE (1) TABLET EACH DAY   meloxicam (MOBIC) 15 MG tablet TAKE 1 TABLET BY MOUTH EVERY DAY WITH FOOD   metFORMIN (GLUCOPHAGE) 1000 MG tablet TAKE 1 TABLET BY MOUTH 2 TIMES DAILY WITH A MEAL. (NEEDS TO BE SEEN BEFORE NEXT REFILL)   Multiple Vitamin (MULTIVITAMIN WITH MINERALS) TABS Take 1 tablet by mouth daily.   olmesartan-hydrochlorothiazide (BENICAR HCT) 40-25 MG tablet TAKE 1 TABLET BY MOUTH EACH DAY.  Needs to be seen for further refills.   olopatadine (PATANOL) 0.1 % ophthalmic solution PLACE 1 DROP IN EACH EYE TWICE DAILY   ONETOUCH DELICA LANCETS FINE MISC TEST UP TO 4 TIMES DIALY   predniSONE (DELTASONE) 20 MG tablet 2 po at same time daily for 5 days   Semaglutide,0.25 or 0.5MG /DOS, (OZEMPIC, 0.25 OR 0.5 MG/DOSE,) 2 MG/1.5ML SOPN 0.025mg  weekly for 2 weeks then 0.5mg  weekly  Dx:  E11.9   sertraline (ZOLOFT) 100 MG tablet 2 po qd   simvastatin (ZOCOR) 40 MG tablet TAKE 1 TABLET BY MOUTH EVERYDAY AT BEDTIME   tadalafil (CIALIS) 5 MG tablet TAKE 1 TABLET BY MOUTH DAILY AS NEEDED FOR ERECTILE DYSFUNCTION.   zolpidem (AMBIEN) 10 MG tablet Take 1 tablet (10 mg total) by mouth at bedtime as needed.   No facility-administered encounter medications on file as of 06/17/2023.    Past Surgical History:  Procedure Laterality Date   BACK SURGERY   [id number]   lumb fusion   COLONOSCOPY     KNEE ARTHROSCOPY WITH MEDIAL MENISECTOMY Right 09/26/2013   Procedure: RIGHT KNEE ARTHROSCOPY WITH PARTIAL MEDIAL and lateral chrondroplasty MENISECTOMY;  Surgeon: Nilda Simmer, MD;  Location: [facility];  Service: Orthopedics;  Laterality: Right;   left elbow surgery     LITHOTRIPSY     NASAL SEPTUM SURGERY     removal of kidney stones  1981   lt open  removal   SHOULDER ARTHROSCOPY WITH ROTATOR CUFF REPAIR AND SUBACROMIAL DECOMPRESSION Left 09/26/2013   Procedure: LEFT SHOULDER ARTHROSCOPY WITH DEBRIDEMENT, PARTIAL ROTATOR CUFF REPAIR AND SUBACROMIAL DECOMPRESSION AND SAD ACD DISTAL CLAVICULECTOMY;  Surgeon: Nilda Simmer, MD;  Location: [facility];  Service: Orthopedics;  Laterality: Left;   TONSILLECTOMY      Family History  Problem Relation Age of Onset   COPD Mother    Hypertension Father    Alzheimer's disease Father    Diabetes Maternal Grandmother    Kidney disease Neg Hx    Prostate cancer Neg Hx       Controlled substance contract: n/a     Review of Systems  Constitutional:  Negative for diaphoresis.  Eyes:  Negative for pain.  Respiratory:  Negative for shortness of breath.   Cardiovascular:  Negative for chest pain, palpitations and leg swelling.  Gastrointestinal:  Negative for abdominal pain.  Endocrine: Negative for polydipsia.  Skin:  Negative for rash.  Neurological:  Negative for dizziness, weakness and headaches.  Hematological:  Does not bruise/bleed easily.  All other systems reviewed and are negative.      Objective:   Physical Exam Vitals reviewed.  Constitutional:      Appearance: Normal appearance. He is well-developed.  HENT:     Head: Normocephalic.     Nose: Nose normal.     Mouth/Throat:     Mouth: Mucous membranes are moist.     Pharynx: Oropharynx is clear.  Eyes:     Pupils: Pupils are equal, round, and reactive to light.  Neck:     Thyroid: No  thyroid mass or thyromegaly.     Vascular: No carotid bruit or JVD.     Trachea: Phonation normal.  Cardiovascular:     Rate and Rhythm: Normal rate and regular rhythm.  Pulmonary:     Effort: Pulmonary effort is normal. No respiratory distress.     Breath sounds: Normal breath sounds.  Abdominal:     General: Bowel sounds are normal.     Palpations: Abdomen is soft.     Tenderness: There is no abdominal tenderness.  Musculoskeletal:        General: Normal range of motion.     Cervical back: Normal range of motion and neck supple.  Lymphadenopathy:     Cervical: No cervical adenopathy.  Skin:    General: Skin is warm and dry.  Neurological:     Mental Status: He is alert and oriented to person, place, and time.  Psychiatric:        Behavior: Behavior normal.        Thought Content: Thought content normal.        Judgment: Judgment normal.    BP 103/70   Pulse 97   Temp 98.1 F (36.7 C) (Temporal)   Resp 20   Ht 5\' 10"  (1.778 m)   Wt 214 lb (97.1 kg)   SpO2 99%   BMI 30.71 kg/m   HGBA1c 5.8%       Assessment & Plan:  CLAVIN JOHNSEY comes in today with chief complaint of Medical Management of Chronic Issues (Patient unable to sleep and does not have an appetite. Having loose stools the last six weeks)   Diagnosis and orders addressed:  1. Primary hypertension Low sodium diet - CBC with Differential/Platelet - CMP14+EGFR - olmesartan-hydrochlorothiazide (BENICAR HCT) 40-25 MG tablet; TAKE 1 TABLET BY MOUTH EACH DAY.  Needs to be seen for further refills.  Dispense:  90 tablet; Refill: 1  2. Long-term (current) use of injectable non-insulin antidiabetic drugs Stop ozempic  3. Diabetes mellitus treated with oral medication (HCC) Contiunue to watch carbs in diet - Bayer DCA Hb A1c Waived - Microalbumin / creatinine urine ratio - dapagliflozin propanediol (FARXIGA) 10 MG TABS tablet; Take 1 tablet (10 mg total) by mouth daily. TAKE 1 TABLET (10 MG TOTAL) BY MOUTH  DAILY BEFORE BREAKFAST.  Dispense: 90 tablet; Refill: 1 - metFORMIN (GLUCOPHAGE) 1000 MG tablet; TAKE 1 TABLET BY MOUTH 2 TIMES DAILY WITH A MEAL. (NEEDS TO BE SEEN BEFORE NEXT REFILL)  Dispense: 180 tablet; Refill: 1  4. Gastroesophageal reflux disease without esophagitis Avoid spicy foods Do not eat 2 hours prior to bedtime - esomeprazole (NEXIUM) 40 MG capsule; Take 1 capsule (40 mg total) by mouth daily at 12 noon.  Dispense: 90 capsule; Refill: 1  5. Neuropathy Check feet daily Do not go bare footed  6. Hyperlipidemia associated with type 2 diabetes mellitus (HCC) Low fat diet - Lipid panel - ezetimibe (ZETIA) 10 MG tablet; Take 1 tablet (10 mg total) by mouth daily.  Dispense: 90 tablet; Refill: 1 - simvastatin (ZOCOR) 40 MG tablet; TAKE 1 TABLET BY MOUTH EVERYDAY AT BEDTIME  Dispense: 90 tablet; Refill: 1  7. Recurrent major depressive disorder, in full remission (HCC) Increase zoloft to 200mg  daily Stress management  8. Primary insomnia Bedtime rotuine - zolpidem (AMBIEN) 10 MG tablet; Take 1 tablet (10 mg total) by mouth at bedtime as needed.  Dispense: 90 tablet; Refill: 1  9. BPH with obstruction/lower urinary tract symptoms Report any voiding issues  10. BMI 32.0-32.9,adult Discussed diet and exercise for person with BMI >25 Will recheck weight in 3-6 months   11. Degeneration of lumbar intervertebral disc - diazepam (VALIUM) 5 MG tablet; Take 1-2 tablets (5-10 mg total) by mouth every 6 (six) hours as needed.  Dispense: 180 tablet; Refill: 1 - gabapentin (NEURONTIN) 300 MG capsule; Take 1 capsule (300 mg total) by mouth 2 (two) times daily.  Dispense: 180 capsule; Refill: 1   Labs pending Health Maintenance reviewed Diet and exercise encouraged  Follow up plan: 6 months   Mary-Margaret Daphine Deutscher, FNP

## 2023-06-18 LAB — MICROALBUMIN / CREATININE URINE RATIO
Creatinine, Urine: 181.1 mg/dL
Microalb/Creat Ratio: 4 mg/g creat (ref 0–29)
Microalbumin, Urine: 7.7 ug/mL

## 2023-07-05 ENCOUNTER — Other Ambulatory Visit: Payer: Self-pay | Admitting: Nurse Practitioner

## 2023-07-05 DIAGNOSIS — F5101 Primary insomnia: Secondary | ICD-10-CM

## 2023-07-14 ENCOUNTER — Telehealth: Payer: Self-pay | Admitting: Pharmacist

## 2023-07-14 NOTE — Telephone Encounter (Signed)
Ozempic arrived to PCP office via [facility] patient assistance Med was removed from med list in 06/17/23 VM left with patient to clarify Awaiting call back

## 2023-09-18 ENCOUNTER — Other Ambulatory Visit: Payer: Self-pay | Admitting: Nurse Practitioner

## 2023-09-18 DIAGNOSIS — K219 Gastro-esophageal reflux disease without esophagitis: Secondary | ICD-10-CM

## 2023-09-18 DIAGNOSIS — E119 Type 2 diabetes mellitus without complications: Secondary | ICD-10-CM

## 2023-10-27 ENCOUNTER — Ambulatory Visit: Payer: PPO

## 2023-10-27 VITALS — Ht 70.0 in | Wt 205.0 lb

## 2023-10-27 DIAGNOSIS — Z0000 Encounter for general adult medical examination without abnormal findings: Secondary | ICD-10-CM

## 2023-10-27 NOTE — Patient Instructions (Signed)
[name] , Thank you for taking time to come for your Medicare Wellness Visit. I appreciate your ongoing commitment to your health goals. Please review the following plan we discussed and let me know if I can assist you in the future.   Referrals/Orders/Follow-Ups/Clinician Recommendations: Aim for 30 minutes of exercise or brisk walking, 6-8 glasses of water, and 5 servings of fruits and vegetables each day.   This is a list of the screening recommended for you and due dates:  Health Maintenance  Topic Date Due   HIV Screening  Never done   Eye exam for diabetics  10/16/2015   Flu Shot  07/14/2023   DTaP/Tdap/Td vaccine (2 - Td or Tdap) 07/26/2023   COVID-19 Vaccine (3 - Pfizer risk series) 11/12/2023*   Hemoglobin A1C  12/18/2023   Yearly kidney function blood test for diabetes  06/16/2024   Yearly kidney health urinalysis for diabetes  06/16/2024   Complete foot exam   06/16/2024   Medicare Annual Wellness Visit  10/26/2024   Cologuard (Stool DNA test)  12/22/2024   Hepatitis C Screening  Completed   Zoster (Shingles) Vaccine  Completed   HPV Vaccine  Aged Out  *Topic was postponed. The date shown is not the original due date.    Advanced directives: (Copy Requested) Please bring a copy of your health care power of attorney and living will to the office to be added to your chart at your convenience.  Next Medicare Annual Wellness Visit scheduled for next year: Yes Insert preventive care Attachment reference

## 2023-10-27 NOTE — Progress Notes (Signed)
Subjective:   [name] is a 62 y.o. male who presents for Medicare Annual/Subsequent preventive examination.  Visit Complete: Virtual I connected with  [name] on 10/27/23 by a audio enabled telemedicine application and verified that I am speaking with the correct person using two identifiers.  Patient Location: Home  Provider Location: Home Office  I discussed the limitations of evaluation and management by telemedicine. The patient expressed understanding and agreed to proceed.  Vital Signs: Because this visit was a virtual/telehealth visit, some criteria may be missing or patient reported. Any vitals not documented were not able to be obtained and vitals that have been documented are patient reported.  Patient Medicare AWV questionnaire was completed by the patient on 10/27/2023; I have confirmed that all information answered by patient is correct and no changes since this date.  Cardiac Risk Factors include: advanced age (>65men, >93 women);diabetes mellitus;dyslipidemia;male gender;hypertension     Objective:    Today's Vitals   10/27/23 1136  Weight: 205 lb (93 kg)  Height: 5\' 10"  (1.778 m)   Body mass index is 29.41 kg/m.     10/27/2023   11:45 AM 10/25/2022    1:24 PM 06/17/2021    1:48 PM 10/28/2017    9:20 AM 01/19/2017   11:20 AM 09/26/2013    9:24 AM 09/24/2013   10:21 AM  Advanced Directives  Does Patient Have a Medical Advance Directive? Yes No No No No Patient does not have advance directive Patient does not have advance directive;Patient would not like information  Type of Public librarian Power of [place];Living will        Copy of Healthcare Power of Attorney in Chart? No - copy requested        Would patient like information on creating a medical advance directive?  No - Patient declined No - Patient declined  Yes (MAU/Ambulatory/Procedural Areas - Information given)    Pre-existing out of facility DNR order (yellow form or pink  MOST form)      No     Current Medications (verified) Outpatient Encounter Medications as of 10/27/2023  Medication Sig   albuterol (VENTOLIN HFA) 108 (90 Base) MCG/ACT inhaler Inhale 2 puffs into the lungs every 6 (six) hours as needed for wheezing or shortness of breath.   Continuous Blood Gluc Receiver (FREESTYLE LIBRE 2 READER) DEVI 1 each by Does not apply route every 14 (fourteen) days.   Continuous Glucose Sensor (FREESTYLE LIBRE 2 SENSOR) MISC Inject 1 each into the skin every 14 (fourteen) days.   cyclobenzaprine (FLEXERIL) 10 MG tablet Take 10 mg by mouth 3 (three) times daily.   dapagliflozin propanediol (FARXIGA) 10 MG TABS tablet Take 1 tablet (10 mg total) by mouth daily. TAKE 1 TABLET (10 MG TOTAL) BY MOUTH DAILY BEFORE BREAKFAST.   diazepam (VALIUM) 5 MG tablet Take 1-2 tablets (5-10 mg total) by mouth every 6 (six) hours as needed.   esomeprazole (NEXIUM) 40 MG capsule TAKE 1 CAPSULE (40 MG TOTAL) BY MOUTH DAILY AT 12 NOON.   ezetimibe (ZETIA) 10 MG tablet Take 1 tablet (10 mg total) by mouth daily.   fluticasone (FLONASE) 50 MCG/ACT nasal spray USE 1 SPRAY IN EACH NOSTRIL ONCE DAILY   gabapentin (NEURONTIN) 300 MG capsule Take 1 capsule (300 mg total) by mouth 2 (two) times daily.   glucose blood (ACCU-CHEK GUIDE) test strip Check BS up to 4 times daily Dx e11.9   loratadine (CLARITIN) 10 MG tablet TAKE ONE (1) TABLET  EACH DAY   meloxicam (MOBIC) 15 MG tablet TAKE 1 TABLET BY MOUTH EVERY DAY WITH FOOD   metFORMIN (GLUCOPHAGE) 1000 MG tablet TAKE 1 TABLET BY MOUTH 2 TIMES DAILY WITH A MEAL.   Multiple Vitamin (MULTIVITAMIN WITH MINERALS) TABS Take 1 tablet by mouth daily.   olmesartan-hydrochlorothiazide (BENICAR HCT) 40-25 MG tablet TAKE 1 TABLET BY MOUTH EACH DAY.  Needs to be seen for further refills.   olopatadine (PATANOL) 0.1 % ophthalmic solution PLACE 1 DROP IN EACH EYE TWICE DAILY   ONETOUCH DELICA LANCETS FINE MISC TEST UP TO 4 TIMES DIALY   Semaglutide,0.25 or  0.5MG /DOS, (OZEMPIC, 0.25 OR 0.5 MG/DOSE,) 2 MG/3ML SOPN Inject into the skin.   sertraline (ZOLOFT) 100 MG tablet Take 2 tablets (200 mg total) by mouth daily.   simvastatin (ZOCOR) 40 MG tablet TAKE 1 TABLET BY MOUTH EVERYDAY AT BEDTIME   tadalafil (CIALIS) 5 MG tablet TAKE 1 TABLET BY MOUTH DAILY AS NEEDED FOR ERECTILE DYSFUNCTION.   zolpidem (AMBIEN) 10 MG tablet Take 1 tablet (10 mg total) by mouth at bedtime as needed.   hydrocortisone (ANUSOL-HC) 2.5 % rectal cream Place 1 application rectally 2 (two) times daily. (Patient not taking: Reported on 10/27/2023)   No facility-administered encounter medications on file as of 10/27/2023.    Allergies (verified) Acetaminophen, Codeine, and Lipitor [atorvastatin]   History: Past Medical History:  Diagnosis Date   Arthritis    Chronic back pain    stenosis   Constipation    with pain meds   Depression    takes Zoloft daily   Diabetes mellitus without complication (HCC)    takes Metformni daily   Dizziness    GERD (gastroesophageal reflux disease)    takes Nexium daily   Headache(784.0)    occasionally   Hyperlipidemia    takes Zetia daily   Hypertension    takes Benicar daily   Impingement syndrome of left shoulder    Insomnia    takes Ambien nightly   Joint pain    Left rotator cuff tear    Pneumonia 1986   walking   Seasonal allergies    takes Claritin daily   Tear of medial meniscus of right knee    Urinary frequency    takes Flomax daily   Urinary urgency    Past Surgical History:  Procedure Laterality Date   BACK SURGERY  [id number]   lumb fusion   COLONOSCOPY     KNEE ARTHROSCOPY WITH MEDIAL MENISECTOMY Right 09/26/2013   Procedure: RIGHT KNEE ARTHROSCOPY WITH PARTIAL MEDIAL and lateral chrondroplasty MENISECTOMY;  Surgeon: Nilda Simmer, MD;  Location: [facility];  Service: Orthopedics;  Laterality: Right;   left elbow surgery     LITHOTRIPSY     NASAL SEPTUM SURGERY     removal of kidney  stones  1981   lt open removal   SHOULDER ARTHROSCOPY WITH ROTATOR CUFF REPAIR AND SUBACROMIAL DECOMPRESSION Left 09/26/2013   Procedure: LEFT SHOULDER ARTHROSCOPY WITH DEBRIDEMENT, PARTIAL ROTATOR CUFF REPAIR AND SUBACROMIAL DECOMPRESSION AND SAD ACD DISTAL CLAVICULECTOMY;  Surgeon: Nilda Simmer, MD;  Location: [facility];  Service: Orthopedics;  Laterality: Left;   TONSILLECTOMY     Family History  Problem Relation Age of Onset   COPD Mother    Hypertension Father    Alzheimer's disease Father    Diabetes Maternal Grandmother    Kidney disease Neg Hx    Prostate cancer Neg Hx    Social History  Socioeconomic History   Marital status: Divorced    Spouse name: Not on file   Number of children: Not on file   Years of education: Not on file   Highest education level: Associate degree: occupational, Scientist, product/process development, or vocational program  Occupational History   Occupation: retired  Tobacco Use   Smoking status: Never   Smokeless tobacco: Never  Vaping Use   Vaping status: Never Used  Substance and Sexual Activity   Alcohol use: Yes    Comment: rarely   Drug use: No   Sexual activity: Yes  Other Topics Concern   Not on file  Social History Narrative   Lives alone. Daughter lives nearby   Social Determinants of Health   Financial Resource Strain: Low Risk  (10/27/2023)   Overall Financial Resource Strain (CARDIA)    Difficulty of Paying Living Expenses: Not hard at all  Food Insecurity: No Food Insecurity (10/27/2023)   Hunger Vital Sign    Worried About Running Out of Food in the Last Year: Never true    Ran Out of Food in the Last Year: Never true  Transportation Needs: No Transportation Needs (10/27/2023)   PRAPARE - Administrator, Civil Service (Medical): No    Lack of Transportation (Non-Medical): No  Physical Activity: Sufficiently Active (10/27/2023)   Exercise Vital Sign    Days of Exercise per Week: 5 days    Minutes of Exercise per  Session: 30 min  Stress: No Stress Concern Present (10/27/2023)   [employer] of Occupational Health - Occupational Stress Questionnaire    Feeling of Stress : Not at all  Social Connections: Moderately Integrated (10/27/2023)   Social Connection and Isolation Panel [NHANES]    Frequency of Communication with Friends and Family: More than three times a week    Frequency of Social Gatherings with Friends and Family: More than three times a week    Attends Religious Services: More than 4 times per year    Active Member of [employer] or Organizations: Yes    Attends Engineer, structural: More than 4 times per year    Marital Status: Divorced    Tobacco Counseling Counseling given: Not Answered   Clinical Intake:  Pre-visit preparation completed: Yes  Pain : No/denies pain     Nutritional Risks: None Diabetes: Yes CBG done?: No Did pt. bring in CBG monitor from home?: No  How often do you need to have someone help you when you read instructions, pamphlets, or other written materials from your doctor or pharmacy?: 1 - Never  Interpreter Needed?: No  Information entered by :: Renie Ora, LPN   Activities of Daily Living    10/27/2023   11:45 AM  In your present state of health, do you have any difficulty performing the following activities:  Hearing? 0  Vision? 0  Difficulty concentrating or making decisions? 0  Walking or climbing stairs? 0  Dressing or bathing? 0  Doing errands, shopping? 0  Preparing Food and eating ? N  Using the Toilet? N  In the past six months, have you accidently leaked urine? N  Do you have problems with loss of bowel control? N  Managing your Medications? N  Managing your Finances? N  Housekeeping or managing your Housekeeping? N    Patient Care Team: Bennie Pierini, FNP as PCP - General (Family Medicine) Danella Maiers, [facility] as Pharmacist (Family Medicine)  Indicate any recent Medical Services you may have received  from other than  [facility] providers in the past year (date may be approximate).     Assessment:   This is a routine wellness examination for [place].  Hearing/Vision screen Vision Screening - Comments:: Patient to call schedule Dr.Miller    Goals Addressed             This Visit's Progress    Exercise 3x per week (30 min per time)   On track      Depression Screen    10/27/2023   11:41 AM 06/17/2023   10:44 AM 12/14/2022    1:05 PM 11/25/2022   11:02 AM 10/25/2022    1:22 PM 08/26/2022    9:22 AM 05/26/2022    8:31 AM  PHQ 2/9 Scores  PHQ - 2 Score 0 5 0 0 0 2 0  PHQ- 9 Score  13 0 0 0 4 0    Fall Risk    10/27/2023   11:37 AM 06/17/2023   10:44 AM 12/14/2022    1:05 PM 11/25/2022   11:02 AM 10/25/2022    1:19 PM  Fall Risk   Falls in the past year? 0 0 0 0 0  Number falls in past yr: 0    0  Injury with Fall? 0    0  Risk for fall due to : No Fall Risks    No Fall Risks  Follow up Falls prevention discussed    Falls prevention discussed    MEDICARE RISK AT HOME: Medicare Risk at Home Any stairs in or around the home?: No If so, are there any without handrails?: No Home free of loose throw rugs in walkways, pet beds, electrical cords, etc?: Yes Adequate lighting in your home to reduce risk of falls?: Yes Life alert?: No Use of a cane, walker or w/c?: No Grab bars in the bathroom?: Yes Shower chair or bench in shower?: Yes Elevated toilet seat or a handicapped toilet?: Yes  TIMED UP AND GO:  Was the test performed?  No    Cognitive Function:        10/27/2023   11:45 AM 10/25/2022    1:24 PM 06/17/2021    1:49 PM  6CIT Screen  What Year? 0 points 0 points 0 points  What month? 0 points 0 points 0 points  What time? 0 points 0 points 0 points  Count back from 20 0 points 0 points 0 points  Months in reverse 0 points 0 points 0 points  Repeat phrase 0 points 0 points 0 points  Total Score 0 points 0 points 0 points    Immunizations Immunization History   Administered Date(s) Administered   Influenza,inj,Quad PF,6+ Mos 10/15/2014, 11/20/2015, 10/11/2016, 10/17/2017, 11/28/2018, 10/24/2019, 10/02/2020, 11/10/2021, 08/26/2022   PFIZER(Purple Top)SARS-COV-2 Vaccination 04/12/2020, 05/03/2020   PNEUMOCOCCAL CONJUGATE-20 11/10/2021   Tdap 07/25/2013   Zoster Recombinant(Shingrix) 02/18/2022, 05/26/2022    TDAP status: Due, Education has been provided regarding the importance of this vaccine. Advised may receive this vaccine at local pharmacy or Health Dept. Aware to provide a copy of the vaccination record if obtained from local pharmacy or Health Dept. Verbalized acceptance and understanding.  Flu Vaccine status: Due, Education has been provided regarding the importance of this vaccine. Advised may receive this vaccine at local pharmacy or Health Dept. Aware to provide a copy of the vaccination record if obtained from local pharmacy or Health Dept. Verbalized acceptance and understanding.  Pneumococcal vaccine status: Up to date  Covid-19 vaccine status: Declined, Education has been provided regarding the  importance of this vaccine but patient still declined. Advised may receive this vaccine at local pharmacy or Health Dept.or vaccine clinic. Aware to provide a copy of the vaccination record if obtained from local pharmacy or Health Dept. Verbalized acceptance and understanding.  Qualifies for Shingles Vaccine? Yes   Zostavax completed No   Shingrix Completed?: No.    Education has been provided regarding the importance of this vaccine. Patient has been advised to call insurance company to determine out of pocket expense if they have not yet received this vaccine. Advised may also receive vaccine at local pharmacy or Health Dept. Verbalized acceptance and understanding.  Screening Tests Health Maintenance  Topic Date Due   HIV Screening  Never done   OPHTHALMOLOGY EXAM  10/16/2015   INFLUENZA VACCINE  07/14/2023   DTaP/Tdap/Td (2 - Td or Tdap)  07/26/2023   COVID-19 Vaccine (3 - Pfizer risk series) 11/12/2023 (Originally 05/31/2020)   HEMOGLOBIN A1C  12/18/2023   Diabetic kidney evaluation - eGFR measurement  06/16/2024   Diabetic kidney evaluation - Urine ACR  06/16/2024   FOOT EXAM  06/16/2024   Medicare Annual Wellness (AWV)  10/26/2024   Fecal DNA (Cologuard)  12/22/2024   Hepatitis C Screening  Completed   Zoster Vaccines- Shingrix  Completed   HPV VACCINES  Aged Out    Health Maintenance  Health Maintenance Due  Topic Date Due   HIV Screening  Never done   OPHTHALMOLOGY EXAM  10/16/2015   INFLUENZA VACCINE  07/14/2023   DTaP/Tdap/Td (2 - Td or Tdap) 07/26/2023    Colorectal cancer screening: Type of screening: Cologuard. Completed 12/22/2021. Repeat every 3 years  Lung Cancer Screening: (Low Dose CT Chest recommended if Age 63-80 years, 20 pack-year currently smoking OR have quit w/in 15years.) does not qualify.   Lung Cancer Screening Referral: n/a  Additional Screening:  Hepatitis C Screening: does not qualify; Completed 11/20/2015  Vision Screening: Recommended annual ophthalmology exams for early detection of glaucoma and other disorders of the eye. Is the patient up to date with their annual eye exam?  No  Who is the provider or what is the name of the office in which the patient attends annual eye exams? Dr.Miller  If pt is not established with a provider, would they like to be referred to a provider to establish care? No .   Dental Screening: Recommended annual dental exams for proper oral hygiene  Diabetic Foot Exam: Diabetic Foot Exam: Overdue, Pt has been advised about the importance in completing this exam. Pt is scheduled for diabetic foot exam on next office visit .  Community Resource Referral / Chronic Care Management: CRR required this visit?  No   CCM required this visit?  No     Plan:     I have personally reviewed and noted the following in the patient's chart:   Medical and  social history Use of alcohol, tobacco or illicit drugs  Current medications and supplements including opioid prescriptions. Patient is not currently taking opioid prescriptions. Functional ability and status Nutritional status Physical activity Advanced directives List of other physicians Hospitalizations, surgeries, and ER visits in previous 12 months Vitals Screenings to include cognitive, depression, and falls Referrals and appointments  In addition, I have reviewed and discussed with patient certain preventive protocols, quality metrics, and best practice recommendations. A written personalized care plan for preventive services as well as general preventive health recommendations were provided to patient.     Lorrene Reid, LPN  10/27/2023   After Visit Summary: (MyChart) Due to this being a telephonic visit, the after visit summary with patients personalized plan was offered to patient via MyChart   Nurse Notes: Due TDAP /flu Vaccine

## 2023-11-22 ENCOUNTER — Telehealth: Payer: Self-pay | Admitting: Nurse Practitioner

## 2023-11-28 ENCOUNTER — Ambulatory Visit (INDEPENDENT_AMBULATORY_CARE_PROVIDER_SITE_OTHER): Payer: PPO | Admitting: *Deleted

## 2023-11-28 DIAGNOSIS — E1142 Type 2 diabetes mellitus with diabetic polyneuropathy: Secondary | ICD-10-CM

## 2023-11-28 LAB — HM DIABETES EYE EXAM

## 2023-11-28 NOTE — Progress Notes (Signed)
[name] arrived 11/28/2023 and has given verbal consent to obtain images and complete their overdue diabetic retinal screening.  The images have been sent to an ophthalmologist or optometrist for review and interpretation.  Results will be sent back to William Pierini, FNP for review.  Patient has been informed they will be contacted when we receive the results via telephone or MyChart

## 2023-12-20 ENCOUNTER — Ambulatory Visit: Payer: PPO | Admitting: Nurse Practitioner

## 2023-12-30 ENCOUNTER — Ambulatory Visit (INDEPENDENT_AMBULATORY_CARE_PROVIDER_SITE_OTHER): Payer: PPO | Admitting: Nurse Practitioner

## 2023-12-30 ENCOUNTER — Encounter: Payer: Self-pay | Admitting: Nurse Practitioner

## 2023-12-30 VITALS — BP 106/82 | HR 81 | Temp 97.7°F | Ht 70.0 in | Wt 203.0 lb

## 2023-12-30 DIAGNOSIS — Z23 Encounter for immunization: Secondary | ICD-10-CM | POA: Diagnosis not present

## 2023-12-30 DIAGNOSIS — Z6832 Body mass index (BMI) 32.0-32.9, adult: Secondary | ICD-10-CM | POA: Diagnosis not present

## 2023-12-30 DIAGNOSIS — E119 Type 2 diabetes mellitus without complications: Secondary | ICD-10-CM

## 2023-12-30 DIAGNOSIS — N401 Enlarged prostate with lower urinary tract symptoms: Secondary | ICD-10-CM | POA: Diagnosis not present

## 2023-12-30 DIAGNOSIS — G629 Polyneuropathy, unspecified: Secondary | ICD-10-CM | POA: Diagnosis not present

## 2023-12-30 DIAGNOSIS — R748 Abnormal levels of other serum enzymes: Secondary | ICD-10-CM

## 2023-12-30 DIAGNOSIS — E1169 Type 2 diabetes mellitus with other specified complication: Secondary | ICD-10-CM

## 2023-12-30 DIAGNOSIS — K219 Gastro-esophageal reflux disease without esophagitis: Secondary | ICD-10-CM | POA: Diagnosis not present

## 2023-12-30 DIAGNOSIS — M5441 Lumbago with sciatica, right side: Secondary | ICD-10-CM | POA: Diagnosis not present

## 2023-12-30 DIAGNOSIS — F3342 Major depressive disorder, recurrent, in full remission: Secondary | ICD-10-CM

## 2023-12-30 DIAGNOSIS — G8929 Other chronic pain: Secondary | ICD-10-CM

## 2023-12-30 DIAGNOSIS — M5442 Lumbago with sciatica, left side: Secondary | ICD-10-CM | POA: Diagnosis not present

## 2023-12-30 DIAGNOSIS — E785 Hyperlipidemia, unspecified: Secondary | ICD-10-CM | POA: Diagnosis not present

## 2023-12-30 DIAGNOSIS — Z7984 Long term (current) use of oral hypoglycemic drugs: Secondary | ICD-10-CM

## 2023-12-30 DIAGNOSIS — I10 Essential (primary) hypertension: Secondary | ICD-10-CM

## 2023-12-30 DIAGNOSIS — N138 Other obstructive and reflux uropathy: Secondary | ICD-10-CM

## 2023-12-30 DIAGNOSIS — F5101 Primary insomnia: Secondary | ICD-10-CM

## 2023-12-30 LAB — LIPID PANEL
Chol/HDL Ratio: 4.9 {ratio} (ref 0.0–5.0)
Cholesterol, Total: 183 mg/dL (ref 100–199)
HDL: 37 mg/dL — ABNORMAL LOW (ref >39)
LDL Chol Calc (NIH): 98 mg/dL (ref 0–99)
Triglycerides: 285 mg/dL — ABNORMAL HIGH (ref 0–149)
VLDL Cholesterol Cal: 48 mg/dL — ABNORMAL HIGH (ref 5–40)

## 2023-12-30 LAB — CBC WITH DIFFERENTIAL/PLATELET
Basophils Absolute: 0.1 10*3/uL (ref 0.0–0.2)
Basos: 1 %
EOS (ABSOLUTE): 0.3 10*3/uL (ref 0.0–0.4)
Eos: 4 %
Hematocrit: 43 % (ref 37.5–51.0)
Hemoglobin: 14.3 g/dL (ref 13.0–17.7)
Immature Grans (Abs): 0.1 10*3/uL (ref 0.0–0.1)
Immature Granulocytes: 1 %
Lymphocytes Absolute: 2.5 10*3/uL (ref 0.7–3.1)
Lymphs: 33 %
MCH: 31.1 pg (ref 26.6–33.0)
MCHC: 33.3 g/dL (ref 31.5–35.7)
MCV: 94 fL (ref 79–97)
Monocytes Absolute: 0.5 10*3/uL (ref 0.1–0.9)
Monocytes: 6 %
Neutrophils Absolute: 4.3 10*3/uL (ref 1.4–7.0)
Neutrophils: 55 %
Platelets: 197 10*3/uL (ref 150–450)
RBC: 4.6 x10E6/uL (ref 4.14–5.80)
RDW: 13 % (ref 11.6–15.4)
WBC: 7.7 10*3/uL (ref 3.4–10.8)

## 2023-12-30 LAB — CMP14+EGFR
ALT: 18 [IU]/L (ref 0–44)
AST: 24 [IU]/L (ref 0–40)
Albumin: 4.8 g/dL (ref 3.9–4.9)
Alkaline Phosphatase: 52 [IU]/L (ref 44–121)
BUN/Creatinine Ratio: 20 (ref 10–24)
BUN: 42 mg/dL — ABNORMAL HIGH (ref 8–27)
Bilirubin Total: 0.5 mg/dL (ref 0.0–1.2)
CO2: 17 mmol/L — ABNORMAL LOW (ref 20–29)
Calcium: 9.7 mg/dL (ref 8.6–10.2)
Chloride: 104 mmol/L (ref 96–106)
Creatinine, Ser: 2.09 mg/dL — ABNORMAL HIGH (ref 0.76–1.27)
Globulin, Total: 2.5 g/dL (ref 1.5–4.5)
Glucose: 110 mg/dL — ABNORMAL HIGH (ref 70–99)
Potassium: 5.1 mmol/L (ref 3.5–5.2)
Sodium: 137 mmol/L (ref 134–144)
Total Protein: 7.3 g/dL (ref 6.0–8.5)
eGFR: 35 mL/min/{1.73_m2} — ABNORMAL LOW (ref >59)

## 2023-12-30 LAB — BAYER DCA HB A1C WAIVED: HB A1C (BAYER DCA - WAIVED): 5.5 % (ref 4.8–5.6)

## 2023-12-30 MED ORDER — SERTRALINE HCL 100 MG PO TABS: 200.0000 mg | ORAL_TABLET | Freq: Every day | ORAL | 1 refills | Status: AC

## 2023-12-30 MED ORDER — SIMVASTATIN 40 MG PO TABS: ORAL_TABLET | ORAL | 1 refills | Status: AC

## 2023-12-30 MED ORDER — DIAZEPAM 5 MG PO TABS: 5.0000 mg | ORAL_TABLET | Freq: Four times a day (QID) | ORAL | 1 refills | Status: AC | PRN

## 2023-12-30 MED ORDER — ESOMEPRAZOLE MAGNESIUM 40 MG PO CPDR: 40.0000 mg | DELAYED_RELEASE_CAPSULE | Freq: Every day | ORAL | 1 refills | Status: DC

## 2023-12-30 MED ORDER — DAPAGLIFLOZIN PROPANEDIOL 10 MG PO TABS: 10.0000 mg | ORAL_TABLET | Freq: Every day | ORAL | 1 refills | Status: DC

## 2023-12-30 MED ORDER — GABAPENTIN 300 MG PO CAPS: 300.0000 mg | ORAL_CAPSULE | Freq: Two times a day (BID) | ORAL | 1 refills | Status: DC

## 2023-12-30 MED ORDER — OLMESARTAN MEDOXOMIL-HCTZ 40-25 MG PO TABS: ORAL_TABLET | ORAL | 1 refills | Status: AC

## 2023-12-30 MED ORDER — ZOLPIDEM TARTRATE 10 MG PO TABS: 10.0000 mg | ORAL_TABLET | Freq: Every evening | ORAL | 1 refills | Status: AC | PRN

## 2023-12-30 MED ORDER — METFORMIN HCL 1000 MG PO TABS: ORAL_TABLET | ORAL | 1 refills | Status: DC

## 2023-12-30 MED ORDER — OZEMPIC (0.25 OR 0.5 MG/DOSE) 2 MG/3ML ~~LOC~~ SOPN: 0.2500 mg | PEN_INJECTOR | SUBCUTANEOUS | 5 refills | Status: AC

## 2023-12-30 NOTE — Patient Instructions (Signed)

## 2023-12-30 NOTE — Progress Notes (Signed)
Subjective:    Patient ID: [name], male    DOB: 02/15/1961, 63 y.o.   MRN: [id number]   Chief Complaint: No chief complaint on file.    HPI:  [name] is a 63 y.o. who identifies as a male who was assigned male at birth.   Social history: Lives with: hisself Work history: disability   Comes in today for follow up of the following chronic medical issues:  1. Type 2 diabetes mellitus with diabetic polyneuropathy, without long-term current use of insulin (HCC) Does watch his diet; fasting blood sugars run between 90-110s. No problems with lows. Lab Results  Component Value Date   HGBA1C 5.8 (H) 06/17/2023    2. Hyperlipidemia with target LDL less than 100 Does watch his diet; stays active; walks several times a week most weeks. Lab Results  Component Value Date   CHOL 170 06/17/2023   HDL 41 06/17/2023   LDLCALC 99 06/17/2023   TRIG 174 (H) 06/17/2023   CHOLHDL 4.1 06/17/2023     3. Primary hypertension No c/o chest pain, sob, or headache. Does not check BP at home. BP Readings from Last 3 Encounters:  06/17/23 103/70  12/14/22 125/84  11/25/22 103/75    4. Benign prostatic hyperplasia with urinary hesitancy 5 BPH with obstruction/lower urinary tract symptoms No voiding issues; has appointment with urology in 2 weeks.  6. Gastroesophageal reflux disease without esophagitis Doing well on daily nexium.  7. Neuropathy Does have burning and tingling in bilat feet, no worse, no lesions. gabapentin does help.   8. Recurrent major depressive disorder, in full remission (HCC) Doing well on zoloft; has a hard time during the holidays reflecting on loss of parents, but states this year has been better than the last few. Spending a lot of time with grandkids.     12/30/2023    9:46 AM 10/27/2023   11:41 AM 06/17/2023   10:44 AM  Depression screen PHQ 2/9  Decreased Interest 1 0 3  Down, Depressed, Hopeless 0 0 2  PHQ - 2 Score 1 0 5  Altered sleeping  1  3  Tired, decreased energy 1  3  Change in appetite 1  0  Feeling bad or failure about yourself  0  1  Trouble concentrating 1  1  Moving slowly or fidgety/restless 0  0  Suicidal thoughts 0  0  PHQ-9 Score 5  13  Difficult doing work/chores Not difficult at all  Somewhat difficult      12/30/2023    9:46 AM 06/17/2023   10:45 AM 12/14/2022    1:05 PM 11/25/2022   11:02 AM  GAD 7 : Generalized Anxiety Score  Nervous, Anxious, on Edge 1 0 0 0  Control/stop worrying 0 0 0 0  Worry too much - different things 0 0 0 0  Trouble relaxing 1 2 0 0  Restless 1 0 0 0  Easily annoyed or irritable 0 0 0 0  Afraid - awful might happen 0 0 0 0  Total GAD 7 Score 3 2 0 0  Anxiety Difficulty Somewhat difficult Not difficult at all Not difficult at all Not difficult at all     9. Primary insomnia Takes ambien every night; sleeps between 6-8 hours a night.  10. Chronic bilateral low back pain with bilateral sciatica Rarely has to used meds; bought some insoles for his shoes and that has helped a lot.  11. BMI 32.0-32.9,adult Weight is down 6lb  since September.  Wt Readings from Last 3 Encounters:  12/30/23 203 lb (92.1 kg)  10/27/23 205 lb (93 kg)  06/17/23 214 lb (97.1 kg)   BMI Readings from Last 3 Encounters:  12/30/23 29.13 kg/m  10/27/23 29.41 kg/m  06/17/23 30.71 kg/m      New complaints: none  Allergies  Allergen Reactions   Acetaminophen    Codeine     anxiety   Lipitor [Atorvastatin]     Myalgia   Outpatient Encounter Medications as of 12/30/2023  Medication Sig   albuterol (VENTOLIN HFA) 108 (90 Base) MCG/ACT inhaler Inhale 2 puffs into the lungs every 6 (six) hours as needed for wheezing or shortness of breath.   Continuous Blood Gluc Receiver (FREESTYLE LIBRE 2 READER) DEVI 1 each by Does not apply route every 14 (fourteen) days.   Continuous Glucose Sensor (FREESTYLE LIBRE 2 SENSOR) MISC Inject 1 each into the skin every 14 (fourteen) days.    cyclobenzaprine (FLEXERIL) 10 MG tablet Take 10 mg by mouth 3 (three) times daily.   dapagliflozin propanediol (FARXIGA) 10 MG TABS tablet Take 1 tablet (10 mg total) by mouth daily. TAKE 1 TABLET (10 MG TOTAL) BY MOUTH DAILY BEFORE BREAKFAST.   diazepam (VALIUM) 5 MG tablet Take 1-2 tablets (5-10 mg total) by mouth every 6 (six) hours as needed.   esomeprazole (NEXIUM) 40 MG capsule TAKE 1 CAPSULE (40 MG TOTAL) BY MOUTH DAILY AT 12 NOON.   ezetimibe (ZETIA) 10 MG tablet Take 1 tablet (10 mg total) by mouth daily.   fluticasone (FLONASE) 50 MCG/ACT nasal spray USE 1 SPRAY IN EACH NOSTRIL ONCE DAILY   gabapentin (NEURONTIN) 300 MG capsule Take 1 capsule (300 mg total) by mouth 2 (two) times daily.   glucose blood (ACCU-CHEK GUIDE) test strip Check BS up to 4 times daily Dx e11.9   hydrocortisone (ANUSOL-HC) 2.5 % rectal cream Place 1 application rectally 2 (two) times daily. (Patient not taking: Reported on 10/27/2023)   loratadine (CLARITIN) 10 MG tablet TAKE ONE (1) TABLET EACH DAY   meloxicam (MOBIC) 15 MG tablet TAKE 1 TABLET BY MOUTH EVERY DAY WITH FOOD   metFORMIN (GLUCOPHAGE) 1000 MG tablet TAKE 1 TABLET BY MOUTH 2 TIMES DAILY WITH A MEAL.   Multiple Vitamin (MULTIVITAMIN WITH MINERALS) TABS Take 1 tablet by mouth daily.   olmesartan-hydrochlorothiazide (BENICAR HCT) 40-25 MG tablet TAKE 1 TABLET BY MOUTH EACH DAY.  Needs to be seen for further refills.   olopatadine (PATANOL) 0.1 % ophthalmic solution PLACE 1 DROP IN EACH EYE TWICE DAILY   ONETOUCH DELICA LANCETS FINE MISC TEST UP TO 4 TIMES DIALY   Semaglutide,0.25 or 0.5MG /DOS, (OZEMPIC, 0.25 OR 0.5 MG/DOSE,) 2 MG/3ML SOPN Inject into the skin.   sertraline (ZOLOFT) 100 MG tablet Take 2 tablets (200 mg total) by mouth daily.   simvastatin (ZOCOR) 40 MG tablet TAKE 1 TABLET BY MOUTH EVERYDAY AT BEDTIME   tadalafil (CIALIS) 5 MG tablet TAKE 1 TABLET BY MOUTH DAILY AS NEEDED FOR ERECTILE DYSFUNCTION.   zolpidem (AMBIEN) 10 MG tablet Take 1  tablet (10 mg total) by mouth at bedtime as needed.   No facility-administered encounter medications on file as of 12/30/2023.    Past Surgical History:  Procedure Laterality Date   BACK SURGERY  [id number]   lumb fusion   COLONOSCOPY     KNEE ARTHROSCOPY WITH MEDIAL MENISECTOMY Right 09/26/2013   Procedure: RIGHT KNEE ARTHROSCOPY WITH PARTIAL MEDIAL and lateral chrondroplasty MENISECTOMY;  Surgeon: Nilda Simmer,  MD;  Location: [facility];  Service: Orthopedics;  Laterality: Right;   left elbow surgery     LITHOTRIPSY     NASAL SEPTUM SURGERY     removal of kidney stones  1981   lt open removal   SHOULDER ARTHROSCOPY WITH ROTATOR CUFF REPAIR AND SUBACROMIAL DECOMPRESSION Left 09/26/2013   Procedure: LEFT SHOULDER ARTHROSCOPY WITH DEBRIDEMENT, PARTIAL ROTATOR CUFF REPAIR AND SUBACROMIAL DECOMPRESSION AND SAD ACD DISTAL CLAVICULECTOMY;  Surgeon: Nilda Simmer, MD;  Location: [facility];  Service: Orthopedics;  Laterality: Left;   TONSILLECTOMY      Family History  Problem Relation Age of Onset   COPD Mother    Hypertension Father    Alzheimer's disease Father    Diabetes Maternal Grandmother    Kidney disease Neg Hx    Prostate cancer Neg Hx       Controlled substance contract: 08/26/22; urine tox 08/26/22      Review of Systems  Constitutional:  Negative for activity change, appetite change and fatigue.  Eyes:  Negative for pain.  Respiratory:  Negative for chest tightness and shortness of breath.   Cardiovascular:  Negative for chest pain, palpitations and leg swelling.  Gastrointestinal:  Negative for abdominal pain, nausea and vomiting.  Endocrine: Negative for cold intolerance, heat intolerance, polydipsia and polyuria.  Genitourinary:  Negative for difficulty urinating and dysuria.  Musculoskeletal:  Negative for myalgias.  Skin:  Negative for rash.  Neurological:  Negative for dizziness, weakness and headaches.  Hematological:   Negative for adenopathy.  All other systems reviewed and are negative.      Objective:   Physical Exam Vitals and nursing note reviewed.  Constitutional:      General: He is not in acute distress.    Appearance: Normal appearance. He is well-developed. He is not ill-appearing.  HENT:     Head: Normocephalic and atraumatic.     Right Ear: Tympanic membrane normal.     Left Ear: Tympanic membrane normal.     Nose: Nose normal.     Mouth/Throat:     Mouth: Mucous membranes are moist.     Pharynx: Oropharynx is clear.  Eyes:     Conjunctiva/sclera: Conjunctivae normal.     Pupils: Pupils are equal, round, and reactive to light.  Neck:     Vascular: No carotid bruit.  Cardiovascular:     Rate and Rhythm: Normal rate and regular rhythm.     Pulses: Normal pulses.     Heart sounds: Normal heart sounds.  Pulmonary:     Effort: Pulmonary effort is normal. No respiratory distress.     Breath sounds: Normal breath sounds. No wheezing, rhonchi or rales.  Abdominal:     General: Bowel sounds are normal.     Palpations: Abdomen is soft.     Tenderness: There is no abdominal tenderness. There is no guarding or rebound.  Musculoskeletal:        General: Normal range of motion.     Cervical back: Normal range of motion. No tenderness.  Lymphadenopathy:     Cervical: No cervical adenopathy.  Skin:    General: Skin is warm and dry.     Capillary Refill: Capillary refill takes less than 2 seconds.  Neurological:     General: No focal deficit present.     Mental Status: He is alert and oriented to person, place, and time.  Psychiatric:        Mood and Affect: Mood normal.  Behavior: Behavior normal.      BP 106/82 (BP Location: Left Arm, Cuff Size: Normal)   Pulse 81   Temp 97.7 F (36.5 C) (Temporal)   Ht 5\' 10"  (1.778 m)   Wt 203 lb (92.1 kg)   SpO2 100%   BMI 29.13 kg/m   HGBA1c 5.5%      Assessment & Plan:   [name] comes in today with chief complaint  of No chief complaint on file.   Diagnosis and orders addressed:  1. Type 2 diabetes mellitus with diabetic polyneuropathy, without long-term current use of insulin (HCC) Continue to watch carb intake - Bayer DCA Hb A1c Waived - Semaglutide,0.25 or 0.5MG /DOS, (OZEMPIC, 0.25 OR 0.5 MG/DOSE,) 2 MG/1.5ML SOPN; 0.025mg  weekly for 2 weeks then 0.5mg  weekly  Dx:  E11.9  Dispense: 3 mL; Refill: 2 - dapagliflozin propanediol (FARXIGA) 10 MG TABS tablet; Take 1 tablet (10 mg total) by mouth daily. TAKE 1 TABLET (10 MG TOTAL) BY MOUTH DAILY BEFORE BREAKFAST.  Dispense: 90 tablet; Refill: 1 - metFORMIN (GLUCOPHAGE) 1000 MG tablet; TAKE 1 TABLET BY MOUTH 2 TIMES DAILY WITH A MEAL. (NEEDS TO BE SEEN BEFORE NEXT REFILL)  Dispense: 180 tablet; Refill: 1  2. Hyperlipidemia with target LDL less than 100 Low fat diet - Lipid panel - esomeprazole (NEXIUM) 40 MG capsule; Take 1 capsule (40 mg total) by mouth daily at 12 noon.  Dispense: 90 capsule; Refill: 1 - ezetimibe (ZETIA) 10 MG tablet; Take 1 tablet (10 mg total) by mouth daily.  Dispense: 90 tablet; Refill: 1 - simvastatin (ZOCOR) 40 MG tablet; TAKE 1 TABLET BY MOUTH EVERYDAY AT BEDTIME  Dispense: 90 tablet; Refill: 1  3. Primary hypertension Low sodium diet - CBC with Differential/Platelet - CMP14+EGFR - olmesartan-hydrochlorothiazide (BENICAR HCT) 40-25 MG tablet; TAKE 1 TABLET BY MOUTH EACH DAY.  Needs to be seen for further refills.  Dispense: 90 tablet; Refill: 1  4. Benign prostatic hyperplasia with urinary hesitancy Keep appointment with urology Report new symptoms - PSA, total and free  5. Gastroesophageal reflux disease without esophagitis Avoid spicy foods Avoid eating 2 hours before bedtime - esomeprazole (NEXIUM) 40 MG capsule; Take 1 capsule (40 mg total) by mouth daily at 12 noon.  Dispense: 90 capsule; Refill: 1 - sertraline (ZOLOFT) 100 MG tablet; 2 po qd  Dispense: 180 tablet; Refill: 1  6. Neuropathy Continue  neurontin Avoid going barefoot Check feet for sores regularly  7. BPH with obstruction/lower urinary tract symptoms   8. Recurrent major depressive disorder, in full remission (HCC) Stress management - sertraline (ZOLOFT) 100 MG tablet; 2 po qd  Dispense: 180 tablet; Refill: 1  9. Primary insomnia Bedtime routine Avoid caffeine - zolpidem (AMBIEN) 10 MG tablet; Take 1 tablet (10 mg total) by mouth at bedtime as needed.  Dispense: 90 tablet; Refill: 1  10. Chronic bilateral low back pain with bilateral sciatica Continue with supportive shoes and PRN meds  11. BMI 32.0-32.9,adult Discussed diet and exercise for person with BMI > 25 Will recheck weight in 3-6 months  12. Degeneration of lumbar intervertebral disc - diazepam (VALIUM) 5 MG tablet; Take 1-2 tablets (5-10 mg total) by mouth every 6 (six) hours as needed.  Dispense: 180 tablet; Refill: 1 - gabapentin (NEURONTIN) 300 MG capsule; Take 1 capsule (300 mg total) by mouth 2 (two) times daily.  Dispense: 180 capsule; Refill: 1   Labs pending Health Maintenance reviewed Diet and exercise encouraged  Follow up plan: 6 months  Joni Reining  Means, FNP student    Mary-Margaret Daphine Deutscher, FNP

## 2024-01-02 NOTE — Addendum Note (Signed)
Addended by: Bennie Pierini on: 01/02/2024 09:40 AM   Modules accepted: Orders

## 2024-01-03 LAB — TOXASSURE SELECT 13 (MW), URINE

## 2024-01-06 ENCOUNTER — Telehealth: Payer: Self-pay | Admitting: Nurse Practitioner

## 2024-01-06 NOTE — Telephone Encounter (Signed)
Please advise

## 2024-01-09 ENCOUNTER — Other Ambulatory Visit (HOSPITAL_COMMUNITY): Payer: Self-pay

## 2024-01-09 ENCOUNTER — Telehealth: Payer: Self-pay | Admitting: Pharmacist

## 2024-01-09 NOTE — Telephone Encounter (Signed)
Pharmacy Patient Advocate Encounter  Received notification from [facility] ADVANT[employer]/RX ADVANCE that Prior Authorization for Ozempic (0.25 or 0.5 MG/DOSE) 2MG /3ML pen-injectors has been APPROVED from 01/09/24 to 01/08/25. Ran test claim, Copay is $47.00. This test claim was processed through [facility]- copay amounts may vary at other pharmacies due to pharmacy/plan contracts, or as the patient moves through the different stages of their insurance plan.   PA #/Case ID/Reference #: [id number]

## 2024-01-13 DIAGNOSIS — G629 Polyneuropathy, unspecified: Secondary | ICD-10-CM | POA: Diagnosis not present

## 2024-01-13 DIAGNOSIS — Z79899 Other long term (current) drug therapy: Secondary | ICD-10-CM | POA: Diagnosis not present

## 2024-01-13 DIAGNOSIS — R5383 Other fatigue: Secondary | ICD-10-CM | POA: Diagnosis not present

## 2024-01-13 DIAGNOSIS — Z7985 Long-term (current) use of injectable non-insulin antidiabetic drugs: Secondary | ICD-10-CM | POA: Diagnosis not present

## 2024-01-13 DIAGNOSIS — F32A Depression, unspecified: Secondary | ICD-10-CM | POA: Diagnosis not present

## 2024-01-13 DIAGNOSIS — E785 Hyperlipidemia, unspecified: Secondary | ICD-10-CM | POA: Diagnosis not present

## 2024-01-13 DIAGNOSIS — I10 Essential (primary) hypertension: Secondary | ICD-10-CM | POA: Diagnosis not present

## 2024-01-13 DIAGNOSIS — E119 Type 2 diabetes mellitus without complications: Secondary | ICD-10-CM | POA: Diagnosis not present

## 2024-01-13 DIAGNOSIS — E1142 Type 2 diabetes mellitus with diabetic polyneuropathy: Secondary | ICD-10-CM | POA: Diagnosis not present

## 2024-01-13 DIAGNOSIS — N138 Other obstructive and reflux uropathy: Secondary | ICD-10-CM | POA: Diagnosis not present

## 2024-01-13 DIAGNOSIS — N401 Enlarged prostate with lower urinary tract symptoms: Secondary | ICD-10-CM | POA: Diagnosis not present

## 2024-01-13 DIAGNOSIS — K219 Gastro-esophageal reflux disease without esophagitis: Secondary | ICD-10-CM | POA: Diagnosis not present

## 2024-02-02 DIAGNOSIS — E538 Deficiency of other specified B group vitamins: Secondary | ICD-10-CM | POA: Diagnosis not present

## 2024-02-03 DIAGNOSIS — E1122 Type 2 diabetes mellitus with diabetic chronic kidney disease: Secondary | ICD-10-CM | POA: Diagnosis not present

## 2024-02-03 DIAGNOSIS — N1832 Chronic kidney disease, stage 3b: Secondary | ICD-10-CM | POA: Diagnosis not present

## 2024-02-03 DIAGNOSIS — E8722 Chronic metabolic acidosis: Secondary | ICD-10-CM | POA: Diagnosis not present

## 2024-02-03 DIAGNOSIS — E875 Hyperkalemia: Secondary | ICD-10-CM | POA: Diagnosis not present

## 2024-02-06 ENCOUNTER — Telehealth: Payer: Self-pay | Admitting: Nurse Practitioner

## 2024-02-06 DIAGNOSIS — N200 Calculus of kidney: Secondary | ICD-10-CM | POA: Diagnosis not present

## 2024-02-06 DIAGNOSIS — E612 Magnesium deficiency: Secondary | ICD-10-CM | POA: Diagnosis not present

## 2024-02-06 NOTE — Telephone Encounter (Signed)
Left message for patient to contact the office.

## 2024-02-06 NOTE — Telephone Encounter (Signed)
 Copied from CRM [id number]. Topic: Clinical - Prescription Issue >> Feb 06, 2024 11:19 AM Thomes Dinning wrote: Reason for CRM: Patient is requesting a call back to get additional info on  how he can get discounted medications

## 2024-02-06 NOTE — Telephone Encounter (Signed)
 Pt returned cb ot [place] about discounted rx. Please cb

## 2024-02-07 ENCOUNTER — Other Ambulatory Visit: Payer: Self-pay

## 2024-02-07 ENCOUNTER — Telehealth: Payer: Self-pay | Admitting: Family Medicine

## 2024-02-07 DIAGNOSIS — E119 Type 2 diabetes mellitus without complications: Secondary | ICD-10-CM

## 2024-02-07 NOTE — Telephone Encounter (Signed)
 Copied from CRM [id number]. Topic: General - Call Back - No Documentation >> Feb 07, 2024 12:24 PM Gery Pray wrote: Reason for CRM: Patient returning [facility] call. Please callback [phone].

## 2024-02-07 NOTE — Telephone Encounter (Signed)
 Patient return call. ?

## 2024-02-07 NOTE — Telephone Encounter (Signed)
 Patient states that he got rx assistance last year for ozempic and farxiga. Wants to renew his application. Referral placed for medication assistance to clinical pharmacist

## 2024-02-08 DIAGNOSIS — E538 Deficiency of other specified B group vitamins: Secondary | ICD-10-CM | POA: Diagnosis not present

## 2024-02-14 ENCOUNTER — Other Ambulatory Visit: Payer: Self-pay | Admitting: Nephrology

## 2024-02-14 DIAGNOSIS — I129 Hypertensive chronic kidney disease with stage 1 through stage 4 chronic kidney disease, or unspecified chronic kidney disease: Secondary | ICD-10-CM

## 2024-02-15 DIAGNOSIS — E538 Deficiency of other specified B group vitamins: Secondary | ICD-10-CM | POA: Diagnosis not present

## 2024-02-17 ENCOUNTER — Ambulatory Visit (HOSPITAL_COMMUNITY)
Admission: RE | Admit: 2024-02-17 | Discharge: 2024-02-17 | Disposition: A | Discharge status: Home / Self Care | Source: Ambulatory Visit | Attending: Nephrology | Admitting: Nephrology

## 2024-02-17 DIAGNOSIS — N189 Chronic kidney disease, unspecified: Secondary | ICD-10-CM | POA: Diagnosis not present

## 2024-02-17 DIAGNOSIS — I129 Hypertensive chronic kidney disease with stage 1 through stage 4 chronic kidney disease, or unspecified chronic kidney disease: Secondary | ICD-10-CM | POA: Insufficient documentation

## 2024-02-22 DIAGNOSIS — E538 Deficiency of other specified B group vitamins: Secondary | ICD-10-CM | POA: Diagnosis not present

## 2024-03-01 ENCOUNTER — Other Ambulatory Visit

## 2024-03-01 DIAGNOSIS — R809 Proteinuria, unspecified: Secondary | ICD-10-CM | POA: Diagnosis not present

## 2024-03-01 DIAGNOSIS — N189 Chronic kidney disease, unspecified: Secondary | ICD-10-CM | POA: Diagnosis not present

## 2024-03-01 DIAGNOSIS — D631 Anemia in chronic kidney disease: Secondary | ICD-10-CM | POA: Diagnosis not present

## 2024-03-01 DIAGNOSIS — E119 Type 2 diabetes mellitus without complications: Secondary | ICD-10-CM | POA: Diagnosis not present

## 2024-03-01 DIAGNOSIS — I10 Essential (primary) hypertension: Secondary | ICD-10-CM | POA: Diagnosis not present

## 2024-03-01 DIAGNOSIS — D472 Monoclonal gammopathy: Secondary | ICD-10-CM | POA: Diagnosis not present

## 2024-03-02 ENCOUNTER — Telehealth: Payer: Self-pay

## 2024-03-02 ENCOUNTER — Other Ambulatory Visit (HOSPITAL_COMMUNITY): Payer: Self-pay

## 2024-03-02 NOTE — Telephone Encounter (Addendum)
 In process of mailing apps for Ozempic (novo nordisk renewal) & Farxiga (az&me, new?).   Income rec'd.  New encounter created

## 2024-03-02 NOTE — Progress Notes (Signed)
 Pharmacy Medication Assistance Program Note    03/28/2024  Patient ID: [name], male  DOB: [birth year], 63 y.o.  MRN:  [id number]     03/02/2024  Outreach Medication Two  Manufacturer Medication Two Novo Nordisk  [facility] Ozempic  Type of Journalist, newspaper to [employer]  Date Application Submitted to Manufacturer 03/02/2024  Patient Assistance Determination Approved    Renewal approved

## 2024-03-06 ENCOUNTER — Other Ambulatory Visit (INDEPENDENT_AMBULATORY_CARE_PROVIDER_SITE_OTHER): Admitting: Pharmacist

## 2024-03-06 ENCOUNTER — Telehealth: Payer: Self-pay | Admitting: Family Medicine

## 2024-03-06 DIAGNOSIS — Z7985 Long-term (current) use of injectable non-insulin antidiabetic drugs: Secondary | ICD-10-CM

## 2024-03-06 DIAGNOSIS — Z7984 Long term (current) use of oral hypoglycemic drugs: Secondary | ICD-10-CM

## 2024-03-06 DIAGNOSIS — E785 Hyperlipidemia, unspecified: Secondary | ICD-10-CM

## 2024-03-06 DIAGNOSIS — E119 Type 2 diabetes mellitus without complications: Secondary | ICD-10-CM

## 2024-03-06 DIAGNOSIS — E1169 Type 2 diabetes mellitus with other specified complication: Secondary | ICD-10-CM

## 2024-03-06 NOTE — Telephone Encounter (Signed)
 Yes farxiga and ozempic! Thank you

## 2024-03-06 NOTE — Progress Notes (Unsigned)
 03/06/2024 Name: [name] MRN: [id number] DOB: 1961-12-07  Chief Complaint  Patient presents with   Diabetes   Hyperlipidemia    [name] is a 63 y.o. year old male who presented for a telephone visit.   They were referred to the pharmacist by their PCP for assistance in managing diabetes.    Subjective: Patient repoted he recently had a visit with [facility] PCP as he wanted to get a second opinion on something, but said he does plan to keep his care with [facility] going forward.  Care Team: Primary Care Provider: Bennie Pierini, FNP ; Next Scheduled Visit: 06/21/24 Nephrology: Dr. Wolfgang Phoenix - Patient reports next scheduled visit on 03/15/24  Medication Access/Adherence  Current Pharmacy:  [id number] [place], [place] - [address] [place] [address] Phone: [phone] Fax: [phone]  [facility] [place], [place] - [address] [place] [address] Phone: [phone] Fax: [phone]   Patient reports affordability concerns with their medications: No  Patient reports access/transportation concerns to their pharmacy: No  Patient reports adherence concerns with their medications:  No     Diabetes:  Current medications: metformin 1000 mg BID, Farxiga 10 mg daily, Ozempic 0.25 mg weekly  Patient reports he is tolerating Ozempic well. Denies GI upset. Says he has about a month left of Ozempic and Farxiga at home. Patient reports last week after [place], started to not feel well and checked [name] app which showed BG 61. Says this had never happened to him before. He had something to eat and felt better. Patient reports his blood sugar is usually between 80-90.   Using [place] 2, patient not able to find reports on the app on his phone despite instruction. However did see 7 day average glucose: 112 mg/dL  Patient reports hypoglycemic s/sx including dizziness,  shakiness, sweating one time last week. Patient denies hyperglycemic symptoms including polyuria, polydipsia, polyphagia, nocturia, neuropathy, blurred vision.  Current medication access support: Ozempic and Farxiga PAP pending   Hyperlipidemia/ASCVD Risk Reduction  Current lipid lowering medications: ezetimibe 10 mg daily, simvastatin 40 mg daily Medications tried in the past: atorvastatin (myalgia)  Patient reports he was not fasting at time of last lipid panel on 01/13/24  Risk Factors: aortic atherosclerosis, DM, HLD, CKD  The 10-year ASCVD risk score (Arnett DK, et al., 2019) is: 13.3%   Values used to calculate the score:     Age: 63 years     Sex: Male     Is Non-Hispanic African American: No     Diabetic: Yes     Tobacco smoker: No     Systolic Blood Pressure: 93 mmHg     Is BP treated: Yes     HDL Cholesterol: 44 mg/dL     Total Cholesterol: 155 mg/dL    Objective:  Lab Results  Component Value Date   HGBA1C 5.5 12/30/2023    Lab Results  Component Value Date   CREATININE 2.09 (H) 12/30/2023   BUN 42 (H) 12/30/2023   NA 137 12/30/2023   K 5.1 12/30/2023   CL 104 12/30/2023   CO2 17 (L) 12/30/2023    Lab Results  Component Value Date   CHOL 183 12/30/2023   HDL 37 (L) 12/30/2023   LDLCALC 98 12/30/2023   TRIG 285 (H) 12/30/2023   CHOLHDL 4.9 12/30/2023    Medications Reviewed Today  Reviewed by Vela Prose, RPH (Pharmacist) on 03/06/24 at 1045  Med List Status: <None>   Medication Order Taking? Sig Documenting Provider Last Dose Status Informant  albuterol (VENTOLIN HFA) 108 (90 Base) MCG/ACT inhaler [id number] Yes Inhale 2 puffs into the lungs every 6 (six) hours as needed for wheezing or shortness of breath. Bennie Pierini, FNP Taking Active   Continuous Blood Gluc Receiver (FREESTYLE LIBRE 2 READER) DEVI [id number]  1 each by Does not apply route every 14 (fourteen) days. Daphine Deutscher, Mary-Margaret, FNP  Active   Continuous Glucose Sensor  (FREESTYLE LIBRE 2 SENSOR) [place] [id number]  Inject 1 each into the skin every 14 (fourteen) days. Daphine Deutscher, Mary-Margaret, FNP  Active   dapagliflozin propanediol (FARXIGA) 10 MG TABS tablet [id number] Yes Take 1 tablet (10 mg total) by mouth daily. TAKE 1 TABLET (10 MG TOTAL) BY MOUTH DAILY BEFORE BREAKFAST. Daphine Deutscher, Mary-Margaret, FNP Taking Active   diazepam (VALIUM) 5 MG tablet [id number] Yes Take 1-2 tablets (5-10 mg total) by mouth every 6 (six) hours as needed. Daphine Deutscher, Mary-Margaret, FNP Taking Active   esomeprazole (NEXIUM) 40 MG capsule [id number] Yes Take 1 capsule (40 mg total) by mouth daily at 12 noon. Daphine Deutscher Mary-Margaret, FNP Taking Active   ezetimibe (ZETIA) 10 MG tablet [id number] Yes Take 1 tablet (10 mg total) by mouth daily. Daphine Deutscher, Mary-Margaret, FNP Taking Active   fluticasone [facility]) 50 MCG/ACT nasal spray [id number] Yes USE 1 SPRAY IN EACH NOSTRIL ONCE DAILY Daphine Deutscher, Mary-Margaret, FNP Taking Active   gabapentin (NEURONTIN) 300 MG capsule [id number] Yes Take 1 capsule (300 mg total) by mouth 2 (two) times daily. Daphine Deutscher Mary-Margaret, FNP Taking Active   glucose blood (ACCU-CHEK GUIDE) test strip [id number]  Check BS up to 4 times daily Dx e11.9 Bennie Pierini, FNP  Active   hydrocortisone (ANUSOL-HC) 2.5 % rectal cream [id number]  Place 1 application rectally 2 (two) times daily. Daphine Deutscher, Mary-Margaret, FNP  Active   loratadine (CLARITIN) 10 MG tablet [id number] Yes TAKE ONE (1) TABLET EACH DAY Martin, Mary-Margaret, FNP Taking Active   meloxicam (MOBIC) 15 MG tablet [id number] Yes TAKE 1 TABLET BY MOUTH EVERY DAY WITH FOOD Daphine Deutscher, Mary-Margaret, FNP Taking Active   metFORMIN (GLUCOPHAGE) 1000 MG tablet [id number] Yes TAKE 1 TABLET BY MOUTH 2 TIMES DAILY WITH A MEAL. Daphine Deutscher Mary-Margaret, FNP Taking Active   Multiple Vitamin (MULTIVITAMIN WITH MINERALS) TABS [id number] Yes Take 1 tablet by mouth daily. [provider] Taking Active Multiple Informants   olmesartan-hydrochlorothiazide (BENICAR HCT) 40-25 MG tablet [id number] Yes TAKE 1 TABLET BY MOUTH EACH DAY.  Needs to be seen for further refills. Daphine Deutscher, Mary-Margaret, FNP Taking Active   olopatadine (PATANOL) 0.1 % ophthalmic solution [id number] Yes PLACE 1 DROP IN [facility] EYE TWICE DAILY Bennie Pierini, FNP Taking Active   [facility] LANCETS FINE MISC [id number]  TEST UP TO 4 TIMES DIALY Daphine Deutscher Mary-Margaret, FNP  Active   Semaglutide,0.25 or 0.5MG /DOS, (OZEMPIC, 0.25 OR 0.5 MG/DOSE,) 2 MG/3ML SOPN [id number] Yes Inject 0.25 mg into the skin once a week. Daphine Deutscher, Mary-Margaret, FNP Taking Active   sertraline (ZOLOFT) 100 MG tablet [id number] Yes Take 2 tablets (200 mg total) by mouth daily. Daphine Deutscher, Mary-Margaret, FNP Taking Active   simvastatin (ZOCOR) 40 MG tablet [id number] Yes TAKE 1 TABLET BY MOUTH EVERYDAY AT BEDTIME Daphine Deutscher, Mary-Margaret, FNP Taking Active   sodium bicarbonate 650 MG tablet [id number] Yes Take 650 mg by mouth 3 (three) times daily. [provider] Taking Active   tadalafil (CIALIS) 5 MG tablet [id number]  TAKE 1  TABLET BY MOUTH DAILY AS NEEDED FOR ERECTILE DYSFUNCTION. Daphine Deutscher, Mary-Margaret, FNP  Active   zolpidem (AMBIEN) 10 MG tablet [id number] Yes Take 1 tablet (10 mg total) by mouth at bedtime as needed. Daphine Deutscher Mary-Margaret, FNP Taking Active               Assessment/Plan:   Diabetes: - Currently controlled with last A1c 5.9%, below goal <7%. Patient with one recent episode of hypoglycemia. Also currently taking metformin 1000 mg BID. Last eGFR 42 - max dose of metformin with eGFR 30-45 is 500 mg BID due to increased risk of lactic acidosis. Given episode of hypo and A1c 5.9% will discontinue metformin today. Instructed patient to increase Ozempic to 0.5 mg if he consistently experiences fasting blood glucose >130 or 2 hr post prandial blood glucose >180. Ozempic and Marcelline Deist preferred agents for slowing progression of CKD. - Reviewed long term  cardiovascular and renal outcomes of uncontrolled blood sugar - Reviewed goal A1c, goal fasting, and goal 2 hour post prandial glucose - Reviewed lifestyle modifications including:  - Recommend to discontinue metformin - Recommend to continue Ozempic 0.25 mg weekly. Increase to 0.5 mg if BG consistently elevated with stopping metformin. - Recommend to continue Farixga 10 mg daily  - Patient denies personal or family history of multiple endocrine neoplasia type 2, medullary thyroid cancer; personal history of pancreatitis or gallbladder disease. - Recommend to check glucose continuously with Libre 2 CGM - Ozempic PAP through [employer] and [place] PAP through AZ&Me pending     Hyperlipidemia/ASCVD Risk Reduction: - Currently uncontrolled, but close to goal with last LDL 78 mg/dL, above goal <62 mg/dL given risk factors DM, aortic atherosclerosis, CKD. TG elevated at 248 mg/dL, but patient reports he was not fasting at time of labs. - Reviewed long term complications of uncontrolled cholesterol - Recommend to continue simvastatin 40 mg daily and ezetimibe 10 mg daily. Recommend discussing atorvastatin myalgia listed in chart at next visit.    Follow Up Plan: PharmD on 04/03/24, PCP on 06/21/24  Jarrett Ables, PharmD PGY-1 Pharmacy Resident  Kieth Brightly, PharmD, BCACP, CPP Clinical Pharmacist, [facility]

## 2024-03-06 NOTE — Progress Notes (Signed)
 Pharmacy Medication Assistance Program Note    04/18/2024  Patient ID: [name], male   DOB: Jan 19, 1961, 63 y.o.   MRN: [id number]     03/02/2024  Outreach Medication One  Manufacturer Medication One [employer]  [employer] Drugs Farxiga   Dose of Farxiga  10MG   Type of Radiographer, therapeutic Assistance  Date Application Sent to Patient 03/06/2024  Application Items Requested Application;Proof of Income  Date Application Submitted to Manufacturer 04/18/2024  Method Application Sent to Manufacturer Fax     NEW - SUBMITTED  Please send a 90 day RX to [facility] pharmacy for patients assistance enrollment. Thanks!

## 2024-03-06 NOTE — Telephone Encounter (Unsigned)
 Copied from CRM [id number]. Topic: General - Other >> Mar 06, 2024  3:50 PM Dondra Prader E wrote: Reason for CRM: Pt called requesting to speak to the clinic regarding his [employer]vonordisk app. Advised of information charted from earlier.   Best contact: [phone]

## 2024-03-06 NOTE — Telephone Encounter (Signed)
 Did we send this in for the patient?

## 2024-03-06 NOTE — Telephone Encounter (Signed)
 Copied from CRM [id number]. Topic: Clinical - Prescription Issue >> Mar 06, 2024  9:31 AM Fuller Mandril wrote: Reason for CRM: Marylu Lund called from [facility] about application received for patient assistance program. States application missing information. Provider information and medication information missing from pages 7-9. Needs to be completed and returned. Fax number: [phone]. Thank You.

## 2024-03-07 ENCOUNTER — Telehealth: Payer: Self-pay | Admitting: Nurse Practitioner

## 2024-03-07 ENCOUNTER — Telehealth: Payer: Self-pay

## 2024-03-07 DIAGNOSIS — E119 Type 2 diabetes mellitus without complications: Secondary | ICD-10-CM

## 2024-03-07 NOTE — Telephone Encounter (Signed)
 Duplicate message.

## 2024-03-07 NOTE — Telephone Encounter (Unsigned)
 Copied from CRM [id number]. Topic: General - Other >> Mar 06, 2024  3:50 PM Dondra Prader E wrote: Reason for CRM: Pt called requesting to speak to the clinic regarding his norvonordisk app. Advised of information charted from earlier.   Best contact: [phone] >> Mar 07, 2024  3:30 PM Emylou G wrote: Patient called again about strange call from [employer] wants to make sure it was legitimate company - was told the medication Scherrie Gerlach would be shipped.. is that the correct company to be expecting?

## 2024-03-07 NOTE — Telephone Encounter (Signed)
 Copied from CRM [id number]. Topic: General - Other >> Mar 07, 2024  3:39 PM [name] wrote: Reason for CRM: Patient requesting a callback from pharmacist.

## 2024-03-08 NOTE — Telephone Encounter (Signed)
 Reached out to patient.   Confirmed [employer] is the patient assistance company that supplies his ozempic medication. Updated him on where application stood. Also informed him of shipment delays and how to go about reaching out to company to get a voucher.  Also informed him to be on the lookout for the az&me application for his farxiga assistance.

## 2024-03-15 DIAGNOSIS — R82991 Hypocitraturia: Secondary | ICD-10-CM | POA: Diagnosis not present

## 2024-03-15 DIAGNOSIS — N1832 Chronic kidney disease, stage 3b: Secondary | ICD-10-CM | POA: Diagnosis not present

## 2024-03-15 DIAGNOSIS — E790 Hyperuricemia without signs of inflammatory arthritis and tophaceous disease: Secondary | ICD-10-CM | POA: Diagnosis not present

## 2024-03-15 DIAGNOSIS — B181 Chronic viral hepatitis B without delta-agent: Secondary | ICD-10-CM | POA: Diagnosis not present

## 2024-03-16 ENCOUNTER — Telehealth: Payer: Self-pay

## 2024-03-16 NOTE — Telephone Encounter (Signed)
 Per DPR left message informing [name] that Ozempic is in office and ready for pick up. [facility] 04/04

## 2024-04-03 ENCOUNTER — Other Ambulatory Visit

## 2024-04-06 ENCOUNTER — Other Ambulatory Visit: Admitting: Pharmacist

## 2024-04-06 DIAGNOSIS — Z7985 Long-term (current) use of injectable non-insulin antidiabetic drugs: Secondary | ICD-10-CM | POA: Diagnosis not present

## 2024-04-06 DIAGNOSIS — G720 Drug-induced myopathy: Secondary | ICD-10-CM | POA: Diagnosis not present

## 2024-04-06 DIAGNOSIS — E119 Type 2 diabetes mellitus without complications: Secondary | ICD-10-CM | POA: Diagnosis not present

## 2024-04-06 DIAGNOSIS — E782 Mixed hyperlipidemia: Secondary | ICD-10-CM | POA: Diagnosis not present

## 2024-04-06 MED ORDER — DAPAGLIFLOZIN PROPANEDIOL 10 MG PO TABS: 10.0000 mg | ORAL_TABLET | Freq: Every day | ORAL | 4 refills | Status: AC

## 2024-04-06 NOTE — Progress Notes (Signed)
 04/06/2024 Name: [name] MRN: [id number] DOB: [birth year]  Chief Complaint  Patient presents with   Diabetes    [name] is a 63 y.o. year old male who presented for a clinic visit.   They were referred to the pharmacist by their PCP for assistance in managing diabetes and medication access.    Subjective:  Care Team: Primary Care Provider: Delfina Feller, FNP ; Next Scheduled Visit: 06/21/24 Nephrologist--saw on 03/15/24  Medication Access/Adherence  Current Pharmacy:  [id number] [place] - [address] [place] [address] Phone: [phone] Fax: [phone]  [facility] [place], [place] - [address] [place] [address] Phone: [phone] Fax: [phone]  [facility] - [place], [place] - [address] [employer] [address] [place] [address] Phone: [phone] Fax: [phone]  Patient reports affordability concerns with their medications: Yes --enrolled in patient assistance programs (farxiga  & ozempic ) Patient reports access/transportation concerns to their pharmacy: No  Patient reports adherence concerns with their medications:  Yes  cost but now enrolled in PAP  Current medications: Farxiga  10 mg daily, Ozempic  0.25 mg weekly past meds: was on metformin , we d/c'd at last visit due to CKD  Patient reports he is tolerating Ozempic  well. Denies GI upset. Picked up Ozempic  at our office last week He is out of Farxiga  (awaiting PAP--will ship to patient's home) Patient reports his blood sugar is usually between 80-105 He is enjoying using FSL2-->discussed needing to transition him to FSL3+ soon (uses phone as reader)   Using [place] 2, patient not able to find reports on the app on his phone despite instruction. However did see 7 day average glucose: 112 mg/dL   Patient reports hypoglycemic s/sx including dizziness, shakiness, sweating one time last week.  Patient denies hyperglycemic symptoms including polyuria, polydipsia, polyphagia, nocturia, neuropathy, blurred vision.   Current medication access support: Ozempic  and Farxiga  PAP (Ozempic  approved, awaiting Farxiga )     Hyperlipidemia/ASCVD Risk Reduction   Current lipid lowering medications: ezetimibe  10 mg daily, simvastatin  40 mg daily Medications tried in the past: atorvastatin (myalgia)   Patient reports he was not fasting at time of last lipid panel on 01/13/24   Risk Factors: aortic atherosclerosis, DM, HLD, CKD   The 10-year ASCVD risk score (Arnett DK, et al., 2019) is: 13.3%   Values used to calculate the score:     Age: 61 years     Sex: Male     Is Non-Hispanic African American: No     Diabetic: Yes     Tobacco smoker: No     Systolic Blood Pressure: 93 mmHg     Is BP treated: Yes     HDL Cholesterol: 44 mg/dL     Total Cholesterol: 155 mg/dL   Objective:  Lab Results  Component Value Date   HGBA1C 5.5 12/30/2023    Lab Results  Component Value Date   CREATININE 2.09 (H) 12/30/2023   BUN 42 (H) 12/30/2023   NA 137 12/30/2023   K 5.1 12/30/2023   CL 104 12/30/2023   CO2 17 (L) 12/30/2023    Lab Results  Component Value Date   CHOL 183 12/30/2023   HDL 37 (L) 12/30/2023   LDLCALC 98 12/30/2023   TRIG 285 (H) 12/30/2023   CHOLHDL 4.9 12/30/2023    Medications Reviewed Today     Reviewed by [name], RPH (  Pharmacist) on 04/06/24 at 1451  Med List Status: <None>   Medication Order Taking? Sig Documenting Provider Last Dose Status Informant  albuterol  (VENTOLIN  HFA) 108 (90 Base) MCG/ACT inhaler [id number] No Inhale 2 puffs into the lungs every 6 (six) hours as needed for wheezing or shortness of breath. William Feller, FNP Taking Active   Continuous Blood Gluc Receiver (FREESTYLE LIBRE 2 READER) DEVI [id number] No 1 each by Does not apply route every 14 (fourteen) days. William Keas Mary-Margaret, FNP Taking Active   Continuous Glucose Sensor  (FREESTYLE LIBRE 2 SENSOR) [place] [id number] No Inject 1 each into the skin every 14 (fourteen) days. William Feller, FNP Taking Active   dapagliflozin  propanediol (FARXIGA ) 10 MG TABS tablet [id number]  Take 1 tablet (10 mg total) by mouth daily. TAKE 1 TABLET (10 MG TOTAL) BY MOUTH DAILY BEFORE BREAKFAST. William Keas, Mary-Margaret, FNP  Active   diazepam  (VALIUM ) 5 MG tablet [id number] No Take 1-2 tablets (5-10 mg total) by mouth every 6 (six) hours as needed. William Feller, FNP Taking Active   esomeprazole  (NEXIUM ) 40 MG capsule [id number] No Take 1 capsule (40 mg total) by mouth daily at 12 noon. William Feller, FNP Taking Active   ezetimibe  (ZETIA ) 10 MG tablet [id number] No Take 1 tablet (10 mg total) by mouth daily. William Feller, FNP Taking Active   fluticasone  (FLONASE ) 50 MCG/ACT nasal spray [id number] No USE 1 SPRAY IN EACH NOSTRIL ONCE DAILY William Feller, FNP Taking Active   gabapentin  (NEURONTIN ) 300 MG capsule [id number] No Take 1 capsule (300 mg total) by mouth 2 (two) times daily. William Feller, FNP Taking Active   glucose blood (ACCU-CHEK GUIDE) test strip [id number] No Check BS up to 4 times daily Dx e11.9 William Feller, FNP Taking Active   hydrocortisone  (ANUSOL -HC) 2.5 % rectal cream [id number] No Place 1 application rectally 2 (two) times daily. William Keas, Mary-Margaret, FNP Taking Active   loratadine  (CLARITIN ) 10 MG tablet [id number] No TAKE ONE (1) TABLET EACH DAY Martin, Mary-Margaret, FNP Taking Active   meloxicam  (MOBIC ) 15 MG tablet [id number] No TAKE 1 TABLET BY MOUTH EVERY DAY WITH FOOD William Keas, Mary-Margaret, FNP Taking Active   Multiple Vitamin (MULTIVITAMIN WITH MINERALS) TABS [id number] No Take 1 tablet by mouth daily. [provider] Taking Active Multiple Informants  olmesartan -hydrochlorothiazide  (BENICAR  HCT) 40-25 MG tablet [id number] No TAKE 1 TABLET BY MOUTH EACH DAY.  Needs to be seen for further refills. William Keas,  Mary-Margaret, FNP Taking Active   olopatadine  (PATANOL) 0.1 % ophthalmic solution [id number] No PLACE 1 DROP IN EACH EYE TWICE DAILY William Feller, FNP Taking Active   [facility] LANCETS FINE MISC [id number] No TEST UP TO 4 TIMES DIALY William Keas Mary-Margaret, FNP Taking Active   Semaglutide ,0.25 or 0.5MG /DOS, (OZEMPIC , 0.25 OR 0.5 MG/DOSE,) 2 MG/3ML SOPN [id number] No Inject 0.25 mg into the skin once a week. William Feller, FNP Taking Active   sertraline  (ZOLOFT ) 100 MG tablet [id number] No Take 2 tablets (200 mg total) by mouth daily. William Keas, Mary-Margaret, FNP Taking Active   simvastatin  (ZOCOR ) 40 MG tablet [id number] No TAKE 1 TABLET BY MOUTH EVERYDAY AT BEDTIME William Feller, FNP Taking Active   sodium bicarbonate 650 MG tablet [id number] No Take 650 mg by mouth 3 (three) times daily. [provider] Taking Active   tadalafil  (CIALIS ) 5 MG tablet [id number] No TAKE 1 TABLET BY MOUTH DAILY AS NEEDED FOR ERECTILE DYSFUNCTION. William Feller, FNP Taking Active   zolpidem  (AMBIEN ) 10 MG tablet [id number] No Take 1 tablet (10 mg total) by mouth  at bedtime as needed. William Keas Mary-Margaret, FNP Taking Active             Assessment/Plan:    Diabetes: - Currently controlled with last A1c 5.5%, below goal <7%. Hypoglycemia resolved, continue current dosing and CGM.  Ozempic  to 0.5 mg if he consistently experiences fasting blood glucose >130 or 2 hr post prandial blood glucose >180. Ozempic  and Farxiga  preferred agents for slowing progression of CKD. - Reviewed long term cardiovascular and renal outcomes of uncontrolled blood sugar - Reviewed goal A1c, goal fasting, and goal 2 hour post prandial glucose - Reviewed lifestyle modifications including:  - Recommend to continue Ozempic  0.25 mg weekly. Increase to 0.5 mg if BG consistently elevated with stopping metformin . - Recommend to continue Farixga 10 mg daily (message sent to CPhT to assist with AZ&me PAP) -  Patient denies personal or family history of multiple endocrine neoplasia type 2, medullary thyroid  cancer; personal history of pancreatitis or gallbladder disease. - Recommend to check glucose continuously with Libre 2 CGM - Ozempic  PAP through [employer] and Farxiga  PAP through AZ&Me       Hyperlipidemia/ASCVD Risk Reduction: - Currently uncontrolled, but close to goal with last LDL 78 mg/dL, above goal <16 mg/dL given risk factors DM, aortic atherosclerosis, CKD. TG elevated at 248 mg/dL, but patient reports he was not fasting at time of labs. - Reviewed long term complications of uncontrolled cholesterol - Recommend to continue simvastatin  40 mg daily and ezetimibe  10 mg daily. Recommend discussing atorvastatin myalgia listed in chart at next visit.       Follow Up Plan: PCP on 06/21/24    Marvell Slider, PharmD, BCACP, CPP

## 2024-04-06 NOTE — Addendum Note (Signed)
 Addended by: Linnie Delgrande D on: 04/06/2024 02:40 PM   Modules accepted: Orders

## 2024-04-06 NOTE — Telephone Encounter (Signed)
 Patient received Ozempic  but has not received farxiga  in the mail Will escribe again to [facility] Patient states he returned requested PAP info/docs about 3 weeks ago

## 2024-04-11 NOTE — Telephone Encounter (Signed)
 Scientist, water quality to Julie.

## 2024-04-23 ENCOUNTER — Telehealth: Payer: Self-pay | Admitting: Nurse Practitioner

## 2024-04-23 MED ORDER — OLOPATADINE HCL 0.1 % OP SOLN: OPHTHALMIC | 0 refills | Status: AC

## 2024-04-23 NOTE — Telephone Encounter (Signed)
 Copied from CRM [id number]. Topic: Clinical - Medication Refill >> Apr 23, 2024 11:31 AM Donald Frost wrote: Medication: olopatadine  (PATANOL) 0.1 % ophthalmic solution  Has the patient contacted their pharmacy? Yes   This is the patient's preferred pharmacy:   [id number] [place], [place] - [address] [place] [address] Phone: [phone] Fax: [phone]   Is this the correct pharmacy for this prescription? Yes If no, delete pharmacy and type the correct one.   Has the prescription been filled recently? No  Is the patient out of the medication? Yes. He states he has been trying to use over the counter eye drops but they are not helping  Has the patient been seen for an appointment in the last year OR does the patient have an upcoming appointment? Yes  Can we respond through MyChart? Yes  Please assist patient further

## 2024-04-23 NOTE — Telephone Encounter (Signed)
 Please advise on RF for eye gtts

## 2024-04-23 NOTE — Addendum Note (Signed)
 Addended by: Theda Payer, MARY-MARGARET on: 04/23/2024 01:48 PM   Modules accepted: Orders

## 2024-05-14 ENCOUNTER — Telehealth: Payer: Self-pay

## 2024-05-24 NOTE — Progress Notes (Signed)
 Pharmacy Medication Assistance Program Note    05/24/2024  Patient ID: [name], male   DOB: 09-Jun-1961, 63 y.o.   MRN: [id number]     03/02/2024  Outreach Medication One  Manufacturer Medication One [employer]  [employer] Drugs Farxiga   Dose of Farxiga  10MG   Type of Assistance Manufacturer Assistance  Date Application Sent to Patient 03/06/2024  Application Items Requested Application;Proof of Income  Date Application Submitted to Manufacturer 04/18/2024  Method Application Sent to Manufacturer Fax  Patient Assistance Determination Approved  Approval End Date 12/12/2024     APPROVED

## 2024-06-21 ENCOUNTER — Ambulatory Visit: Payer: PPO | Admitting: Nurse Practitioner

## 2024-06-21 NOTE — Telephone Encounter (Signed)
 Per rep: order starting processing 06/13/24

## 2024-06-29 DIAGNOSIS — I10 Essential (primary) hypertension: Secondary | ICD-10-CM | POA: Diagnosis not present

## 2024-06-29 DIAGNOSIS — G629 Polyneuropathy, unspecified: Secondary | ICD-10-CM | POA: Diagnosis not present

## 2024-06-29 DIAGNOSIS — Z8619 Personal history of other infectious and parasitic diseases: Secondary | ICD-10-CM | POA: Diagnosis not present

## 2024-06-29 DIAGNOSIS — R5383 Other fatigue: Secondary | ICD-10-CM | POA: Diagnosis not present

## 2024-06-29 DIAGNOSIS — F32A Depression, unspecified: Secondary | ICD-10-CM | POA: Diagnosis not present

## 2024-06-29 DIAGNOSIS — N401 Enlarged prostate with lower urinary tract symptoms: Secondary | ICD-10-CM | POA: Diagnosis not present

## 2024-06-29 DIAGNOSIS — K219 Gastro-esophageal reflux disease without esophagitis: Secondary | ICD-10-CM | POA: Diagnosis not present

## 2024-06-29 DIAGNOSIS — E119 Type 2 diabetes mellitus without complications: Secondary | ICD-10-CM | POA: Diagnosis not present

## 2024-06-29 DIAGNOSIS — E785 Hyperlipidemia, unspecified: Secondary | ICD-10-CM | POA: Diagnosis not present

## 2024-06-29 DIAGNOSIS — E538 Deficiency of other specified B group vitamins: Secondary | ICD-10-CM | POA: Diagnosis not present

## 2024-06-29 DIAGNOSIS — N138 Other obstructive and reflux uropathy: Secondary | ICD-10-CM | POA: Diagnosis not present

## 2024-06-29 DIAGNOSIS — G4700 Insomnia, unspecified: Secondary | ICD-10-CM | POA: Diagnosis not present

## 2024-07-09 ENCOUNTER — Telehealth: Payer: Self-pay

## 2024-07-09 DIAGNOSIS — Z8619 Personal history of other infectious and parasitic diseases: Secondary | ICD-10-CM | POA: Diagnosis not present

## 2024-07-09 NOTE — Telephone Encounter (Signed)
 Called to inform patient that Ozempic  has arrived in office, no answer, voicemail full. Mychart message sent.

## 2024-07-10 NOTE — Telephone Encounter (Signed)
 I called patient and left detailed message that we have his shipment of OZEMPIC  that needs to be picked up asap.

## 2024-07-16 DIAGNOSIS — R5383 Other fatigue: Secondary | ICD-10-CM | POA: Diagnosis not present

## 2024-07-17 ENCOUNTER — Other Ambulatory Visit: Payer: Self-pay | Admitting: Nurse Practitioner

## 2024-07-17 DIAGNOSIS — G629 Polyneuropathy, unspecified: Secondary | ICD-10-CM

## 2024-07-30 DIAGNOSIS — R1010 Upper abdominal pain, unspecified: Secondary | ICD-10-CM | POA: Diagnosis not present

## 2024-10-09 DIAGNOSIS — I10 Essential (primary) hypertension: Secondary | ICD-10-CM | POA: Diagnosis not present

## 2024-10-09 DIAGNOSIS — G629 Polyneuropathy, unspecified: Secondary | ICD-10-CM | POA: Diagnosis not present

## 2024-10-09 DIAGNOSIS — N401 Enlarged prostate with lower urinary tract symptoms: Secondary | ICD-10-CM | POA: Diagnosis not present

## 2024-10-09 DIAGNOSIS — E119 Type 2 diabetes mellitus without complications: Secondary | ICD-10-CM | POA: Diagnosis not present

## 2024-10-09 DIAGNOSIS — K219 Gastro-esophageal reflux disease without esophagitis: Secondary | ICD-10-CM | POA: Diagnosis not present

## 2024-10-09 DIAGNOSIS — G4700 Insomnia, unspecified: Secondary | ICD-10-CM | POA: Diagnosis not present

## 2024-10-09 DIAGNOSIS — E538 Deficiency of other specified B group vitamins: Secondary | ICD-10-CM | POA: Diagnosis not present

## 2024-10-09 DIAGNOSIS — Z23 Encounter for immunization: Secondary | ICD-10-CM | POA: Diagnosis not present

## 2024-10-09 DIAGNOSIS — E1142 Type 2 diabetes mellitus with diabetic polyneuropathy: Secondary | ICD-10-CM | POA: Diagnosis not present

## 2024-10-09 DIAGNOSIS — Z7984 Long term (current) use of oral hypoglycemic drugs: Secondary | ICD-10-CM | POA: Diagnosis not present

## 2024-10-09 DIAGNOSIS — E785 Hyperlipidemia, unspecified: Secondary | ICD-10-CM | POA: Diagnosis not present

## 2024-10-09 DIAGNOSIS — Z7985 Long-term (current) use of injectable non-insulin antidiabetic drugs: Secondary | ICD-10-CM | POA: Diagnosis not present

## 2024-10-09 DIAGNOSIS — Z79899 Other long term (current) drug therapy: Secondary | ICD-10-CM | POA: Diagnosis not present

## 2024-10-09 DIAGNOSIS — F3342 Major depressive disorder, recurrent, in full remission: Secondary | ICD-10-CM | POA: Diagnosis not present

## 2024-10-09 DIAGNOSIS — R3911 Hesitancy of micturition: Secondary | ICD-10-CM | POA: Diagnosis not present

## 2024-10-12 ENCOUNTER — Other Ambulatory Visit: Payer: Self-pay | Admitting: Nurse Practitioner

## 2024-10-12 DIAGNOSIS — K219 Gastro-esophageal reflux disease without esophagitis: Secondary | ICD-10-CM

## 2024-10-22 DIAGNOSIS — Z1211 Encounter for screening for malignant neoplasm of colon: Secondary | ICD-10-CM | POA: Diagnosis not present

## 2024-10-22 DIAGNOSIS — K219 Gastro-esophageal reflux disease without esophagitis: Secondary | ICD-10-CM | POA: Diagnosis not present

## 2024-10-22 DIAGNOSIS — K760 Fatty (change of) liver, not elsewhere classified: Secondary | ICD-10-CM | POA: Diagnosis not present

## 2024-10-22 DIAGNOSIS — R1314 Dysphagia, pharyngoesophageal phase: Secondary | ICD-10-CM | POA: Diagnosis not present

## 2024-10-22 DIAGNOSIS — R1011 Right upper quadrant pain: Secondary | ICD-10-CM | POA: Diagnosis not present

## 2024-10-29 DIAGNOSIS — K3189 Other diseases of stomach and duodenum: Secondary | ICD-10-CM | POA: Diagnosis not present

## 2024-10-29 DIAGNOSIS — D128 Benign neoplasm of rectum: Secondary | ICD-10-CM | POA: Diagnosis not present

## 2024-10-29 DIAGNOSIS — K648 Other hemorrhoids: Secondary | ICD-10-CM | POA: Diagnosis not present

## 2024-10-29 DIAGNOSIS — R1314 Dysphagia, pharyngoesophageal phase: Secondary | ICD-10-CM | POA: Diagnosis not present

## 2024-10-29 DIAGNOSIS — R1011 Right upper quadrant pain: Secondary | ICD-10-CM | POA: Diagnosis not present

## 2024-10-29 DIAGNOSIS — D123 Benign neoplasm of transverse colon: Secondary | ICD-10-CM | POA: Diagnosis not present

## 2024-10-29 DIAGNOSIS — K219 Gastro-esophageal reflux disease without esophagitis: Secondary | ICD-10-CM | POA: Diagnosis not present

## 2024-10-29 DIAGNOSIS — Z1211 Encounter for screening for malignant neoplasm of colon: Secondary | ICD-10-CM | POA: Diagnosis not present

## 2024-10-29 DIAGNOSIS — K2950 Unspecified chronic gastritis without bleeding: Secondary | ICD-10-CM | POA: Diagnosis not present

## 2024-11-13 NOTE — Progress Notes (Signed)
 Pharmacy Quality Measure Review  This patient is appearing on a report for being at risk of failing the adherence measure for cholesterol (statin) medications this calendar year.   Medication: simvastatin  40 mg Last fill date: 10/27/24 for 90 day supply  Insurance report was not up to date. No action needed at this time.   Woodie Jock, PharmD PGY1 Pharmacy Resident  11/13/2024

## 2024-11-23 ENCOUNTER — Other Ambulatory Visit: Payer: Self-pay | Admitting: Nurse Practitioner

## 2024-11-23 DIAGNOSIS — K219 Gastro-esophageal reflux disease without esophagitis: Secondary | ICD-10-CM

## 2024-11-23 NOTE — Telephone Encounter (Signed)
Left message to schedule appt

## 2024-11-23 NOTE — Telephone Encounter (Signed)
 mmm pt NTBS 30-d given 10/12/24

## 2024-11-27 ENCOUNTER — Encounter: Payer: Self-pay | Admitting: Nurse Practitioner

## 2024-11-27 ENCOUNTER — Other Ambulatory Visit: Payer: Self-pay | Admitting: Nurse Practitioner

## 2024-11-27 DIAGNOSIS — K219 Gastro-esophageal reflux disease without esophagitis: Secondary | ICD-10-CM

## 2024-11-27 NOTE — Telephone Encounter (Signed)
 LMTCB to make an appt w/MMM for med refill. Also, I sent a letter abut this!

## 2024-11-27 NOTE — Telephone Encounter (Signed)
 MMM pt NTBS 30-d given 10/12/24
